# Patient Record
Sex: Female | Born: 1985 | Race: Black or African American | Hispanic: No | Marital: Married | Smoking: Never smoker
Health system: Southern US, Community
[De-identification: ages and names within clinical notes are randomized; demographics above are authoritative.]

## PROBLEM LIST (undated history)

## (undated) ENCOUNTER — Inpatient Hospital Stay (HOSPITAL_COMMUNITY): Payer: Self-pay

## (undated) DIAGNOSIS — Z8679 Personal history of other diseases of the circulatory system: Secondary | ICD-10-CM

## (undated) DIAGNOSIS — R011 Cardiac murmur, unspecified: Secondary | ICD-10-CM

## (undated) DIAGNOSIS — N971 Female infertility of tubal origin: Secondary | ICD-10-CM

## (undated) DIAGNOSIS — G43909 Migraine, unspecified, not intractable, without status migrainosus: Secondary | ICD-10-CM

## (undated) DIAGNOSIS — N736 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 (postinfective): Secondary | ICD-10-CM

## (undated) DIAGNOSIS — N7011 Chronic salpingitis: Secondary | ICD-10-CM

## (undated) DIAGNOSIS — D649 Anemia, unspecified: Secondary | ICD-10-CM

## (undated) DIAGNOSIS — M419 Scoliosis, unspecified: Secondary | ICD-10-CM

## (undated) DIAGNOSIS — Z8759 Personal history of other complications of pregnancy, childbirth and the puerperium: Secondary | ICD-10-CM

## (undated) DIAGNOSIS — IMO0001 Reserved for inherently not codable concepts without codable children: Secondary | ICD-10-CM

## (undated) DIAGNOSIS — N856 Intrauterine synechiae: Secondary | ICD-10-CM

## (undated) DIAGNOSIS — Z8619 Personal history of other infectious and parasitic diseases: Secondary | ICD-10-CM

## (undated) HISTORY — DX: Personal history of other infectious and parasitic diseases: Z86.19

## (undated) HISTORY — DX: Scoliosis, unspecified: M41.9

---
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
## 2006-02-05 ENCOUNTER — Emergency Department (HOSPITAL_COMMUNITY): Admission: EM | Admit: 2006-02-05 | Discharge: 2006-02-05 | Payer: Self-pay | Admitting: Emergency Medicine

## 2006-03-22 ENCOUNTER — Ambulatory Visit: Payer: Self-pay | Admitting: Cardiovascular Disease

## 2006-06-11 ENCOUNTER — Emergency Department (HOSPITAL_COMMUNITY): Admission: EM | Admit: 2006-06-11 | Discharge: 2006-06-12 | Payer: Self-pay | Admitting: *Deleted

## 2006-11-30 ENCOUNTER — Inpatient Hospital Stay (HOSPITAL_COMMUNITY): Admission: AD | Admit: 2006-11-30 | Discharge: 2006-11-30 | Payer: Self-pay | Admitting: Obstetrics and Gynecology

## 2007-02-15 ENCOUNTER — Inpatient Hospital Stay (HOSPITAL_COMMUNITY): Admission: AD | Admit: 2007-02-15 | Discharge: 2007-02-17 | Payer: Self-pay | Admitting: Obstetrics and Gynecology

## 2007-02-19 ENCOUNTER — Inpatient Hospital Stay (HOSPITAL_COMMUNITY): Admission: AD | Admit: 2007-02-19 | Discharge: 2007-02-19 | Payer: Self-pay | Admitting: Obstetrics

## 2007-04-29 ENCOUNTER — Emergency Department (HOSPITAL_COMMUNITY): Admission: EM | Admit: 2007-04-29 | Discharge: 2007-04-29 | Payer: Self-pay | Admitting: Emergency Medicine

## 2007-11-20 ENCOUNTER — Inpatient Hospital Stay (HOSPITAL_COMMUNITY): Admission: AD | Admit: 2007-11-20 | Discharge: 2007-11-20 | Payer: Self-pay | Admitting: Obstetrics & Gynecology

## 2008-01-16 ENCOUNTER — Emergency Department (HOSPITAL_COMMUNITY): Admission: EM | Admit: 2008-01-16 | Discharge: 2008-01-16 | Payer: Self-pay | Admitting: Family Medicine

## 2008-03-31 ENCOUNTER — Emergency Department (HOSPITAL_COMMUNITY): Admission: EM | Admit: 2008-03-31 | Discharge: 2008-03-31 | Payer: Self-pay | Admitting: Emergency Medicine

## 2008-04-07 ENCOUNTER — Emergency Department (HOSPITAL_COMMUNITY): Admission: EM | Admit: 2008-04-07 | Discharge: 2008-04-07 | Payer: Self-pay | Admitting: Emergency Medicine

## 2008-12-23 ENCOUNTER — Emergency Department (HOSPITAL_COMMUNITY): Admission: EM | Admit: 2008-12-23 | Discharge: 2008-12-24 | Payer: Self-pay | Admitting: Emergency Medicine

## 2009-10-08 ENCOUNTER — Emergency Department (HOSPITAL_COMMUNITY): Admission: EM | Admit: 2009-10-08 | Discharge: 2009-10-08 | Payer: Self-pay | Admitting: Emergency Medicine

## 2010-02-05 HISTORY — PX: TONSILLECTOMY: SUR1361

## 2010-05-18 LAB — DIFFERENTIAL
Basophils Absolute: 0.1 10*3/uL (ref 0.0–0.1)
Eosinophils Absolute: 0.1 10*3/uL (ref 0.0–0.7)
Monocytes Absolute: 0.4 10*3/uL (ref 0.1–1.0)
Monocytes Relative: 7 % (ref 3–12)
Neutro Abs: 3.9 10*3/uL (ref 1.7–7.7)
Neutrophils Relative %: 68 % (ref 43–77)

## 2010-05-18 LAB — CBC
HCT: 36.3 % (ref 36.0–46.0)
Hemoglobin: 11.8 g/dL — ABNORMAL LOW (ref 12.0–15.0)
Platelets: 323 10*3/uL (ref 150–400)
RDW: 21.7 % — ABNORMAL HIGH (ref 11.5–15.5)

## 2010-05-18 LAB — STREP A DNA PROBE: Group A Strep Probe: NEGATIVE

## 2010-05-18 LAB — BASIC METABOLIC PANEL
CO2: 26 mEq/L (ref 19–32)
Calcium: 9.4 mg/dL (ref 8.4–10.5)
GFR calc non Af Amer: >60 mL/min (ref >60)

## 2010-05-23 LAB — POCT I-STAT, CHEM 8
BUN: 7 mg/dL (ref 6–23)
Glucose, Bld: 90 mg/dL (ref 70–99)
Potassium: 3.9 mEq/L (ref 3.5–5.1)
Sodium: 140 mEq/L (ref 135–145)

## 2010-06-23 NOTE — Assessment & Plan Note (Signed)
[facility]                            CARDIOLOGY OFFICE NOTE   April Rodgers, April Rodgers                      MRN:          [id number]  DATE:03/22/2006                            DOB:          [birth year]    April Rodgers was seen today as a new patient, she is 25 years old.  She has  had chest pain for 6 months.  The pain is not necessarily exertional, it  is atypical, she can have it at rest.  It actually coincides with the  time where she stopped going to school due to financial aid problems.   The patient does not notice a pleuritic component to it.  There has been  no associated shortness of breath, PND, orthopnea.  She is not had any  significant palpitations.  She does recount a history of having a heart  murmur and having an ultrasound a long time ago.  The patient does not  think the pain is getting worse, but it is certainly not getting better.  She has not tried any medications to make it go away.  She was diagnosed  with a heart murmur in May of 2002.  SHE IS ALLERGIC TO PENICILLIN.  She  does not smoke or drink.   She had been going to school at [employer], however her financial aid fell  through.  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  She is here by herself, her family lives in [place]  and she has some grandparents out at [place].  She is thinking going  back to [employer] to look at some paramedical education.   She has not had any significant previous surgeries, there is a question  of iron deficiency and chronic fatigue.  There is no history of lupus or  other connective tissue diseases.  She has a brother and a sister who  are healthy.  She is the oldest.  There is no congenital heart disease  in that family, she is not taking any chronic medications except for  vitamins with iron.   Family History : non-contributory   EXAMINATION:  HEENT:  Normal, sclerae are noninjected.  SKIN:  Warm and dry, there is  no evidence of Lupus or connective tissue  disease.  LUNGS:  Clear.  Carotids are normal, there is no thyromegaly, no lymphadenopathy.  There  is an S1 with a fairly widely split second heart sound.  ABDOMEN:  Benign.  EXTREMITIES: Intact pulses, no edema.  NEURO:  Nonfocal.   Her resting EKG is normal.   IMPRESSION:  The patient presents with 2 issues.  One is atypical chest  pain without many coronary risk factors.  She has a totally normal  echocardiogram and we will try to do an exercise treadmill study on her  today, I suspect this will be normal.   In regard to her murmur I actually do not hear a murmur but a split  second heart sound.  She had the previous history of a murmur and I  think given her chest pain that she should have an echocardiogram.  She  will have to come back for this.  I do not think that she needs SB  prophylaxis.  Further recommendations will be based on the results of  these 2 tests.  I encouraged her to possibly take Motrin for this pain,  again, it does coincide with the change in her lifestyle with the loss  of her financial aid, and I suspect that some of the pains may be stress  related.     Noralyn Pick. Eden Emms, MD, [facility]  Electronically Signed    PCN/MedQ  DD: 03/22/2006  DT: 03/22/2006  Job #: [id number]   cc:   Lorelle Formosa, M.D.

## 2010-06-23 NOTE — Procedures (Signed)
[facility]                              EXERCISE TREADMILL   ETRULIA, ZARR                      MRN:          [id number]  DATE:03/22/2006                            DOB:          1986-02-04    INDICATION:  Atypical chest pain with normal EKG.   The patient was able to exercise for 5 minutes and 30 seconds under the  advance stage Bruce protocol, exercise stopped due to fatigue.  Maximum  heart rate was 171, peak blood pressure was 148/70.  Resting EKG was  normal with stress.  There was no ischemia and no arrhythmia.   IMPRESSION:  Negative exercise stress test with inadequate work load, no  evidence of ischemic heart disease.     Noralyn Pick. Eden Emms, MD, [facility]  Electronically Signed    PCN/MedQ  DD: 03/22/2006  DT: 03/22/2006  Job #: [id number]

## 2010-10-17 ENCOUNTER — Emergency Department (HOSPITAL_COMMUNITY)
Admission: EM | Admit: 2010-10-17 | Discharge: 2010-10-17 | Disposition: A | Discharge status: Home / Self Care | Payer: Self-pay | Attending: Emergency Medicine | Admitting: Emergency Medicine

## 2010-10-20 ENCOUNTER — Emergency Department (HOSPITAL_COMMUNITY)
Admission: EM | Admit: 2010-10-20 | Discharge: 2010-10-20 | Disposition: A | Discharge status: Home / Self Care | Payer: Medicaid Other | Attending: Emergency Medicine | Admitting: Emergency Medicine

## 2010-10-20 DIAGNOSIS — J029 Acute pharyngitis, unspecified: Secondary | ICD-10-CM | POA: Insufficient documentation

## 2010-10-26 LAB — CBC
HCT: 31.5 — ABNORMAL LOW
Hemoglobin: 9.1 — ABNORMAL LOW
MCHC: 33.1
MCHC: 33.4
MCV: 81.1
MCV: 81.5
RBC: 2.88 — ABNORMAL LOW
RBC: 3.4 — ABNORMAL LOW
WBC: 10.3
WBC: 13.7 — ABNORMAL HIGH

## 2010-10-26 LAB — RPR: RPR Ser Ql: NONREACTIVE

## 2010-10-30 LAB — BASIC METABOLIC PANEL
BUN: 6
Glucose, Bld: 76
Potassium: 3.1 — ABNORMAL LOW

## 2010-11-06 LAB — GC/CHLAMYDIA PROBE AMP, GENITAL
Chlamydia, DNA Probe: NEGATIVE
GC Probe Amp, Genital: NEGATIVE

## 2010-11-06 LAB — URINALYSIS, ROUTINE W REFLEX MICROSCOPIC
Ketones, ur: NEGATIVE
Nitrite: NEGATIVE
Specific Gravity, Urine: 1.01

## 2010-11-06 LAB — URINE MICROSCOPIC-ADD ON

## 2010-11-06 LAB — WET PREP, GENITAL
Clue Cells Wet Prep HPF POC: NONE SEEN
Trich, Wet Prep: NONE SEEN

## 2010-11-06 LAB — POCT PREGNANCY, URINE: Preg Test, Ur: NEGATIVE

## 2010-11-15 LAB — URINALYSIS, ROUTINE W REFLEX MICROSCOPIC
Glucose, UA: NEGATIVE
Protein, ur: NEGATIVE
pH: 7

## 2010-11-15 LAB — URINE MICROSCOPIC-ADD ON

## 2010-11-15 LAB — WET PREP, GENITAL

## 2010-12-27 ENCOUNTER — Encounter (HOSPITAL_COMMUNITY): Payer: Self-pay | Admitting: *Deleted

## 2010-12-27 ENCOUNTER — Inpatient Hospital Stay (HOSPITAL_COMMUNITY): Payer: Medicaid Other

## 2010-12-27 ENCOUNTER — Inpatient Hospital Stay (HOSPITAL_COMMUNITY)
Admission: AD | Admit: 2010-12-27 | Discharge: 2010-12-27 | Disposition: A | Discharge status: Home / Self Care | Payer: Medicaid Other | Source: Ambulatory Visit | Attending: Obstetrics & Gynecology | Admitting: Obstetrics & Gynecology

## 2010-12-27 DIAGNOSIS — Z3490 Encounter for supervision of normal pregnancy, unspecified, unspecified trimester: Secondary | ICD-10-CM

## 2010-12-27 DIAGNOSIS — Z1389 Encounter for screening for other disorder: Secondary | ICD-10-CM

## 2010-12-27 DIAGNOSIS — R109 Unspecified abdominal pain: Secondary | ICD-10-CM | POA: Insufficient documentation

## 2010-12-27 DIAGNOSIS — O99891 Other specified diseases and conditions complicating pregnancy: Secondary | ICD-10-CM | POA: Insufficient documentation

## 2010-12-27 HISTORY — DX: Anemia, unspecified: D64.9

## 2010-12-27 HISTORY — DX: Cardiac murmur, unspecified: R01.1

## 2010-12-27 LAB — URINALYSIS, ROUTINE W REFLEX MICROSCOPIC
Bilirubin Urine: NEGATIVE
Hgb urine dipstick: NEGATIVE
Ketones, ur: NEGATIVE mg/dL
Specific Gravity, Urine: 1.025 (ref 1.005–1.030)
Urobilinogen, UA: 0.2 mg/dL (ref 0.0–1.0)
pH: 7 (ref 5.0–8.0)

## 2010-12-27 LAB — URINE MICROSCOPIC-ADD ON

## 2010-12-27 LAB — WET PREP, GENITAL: Yeast Wet Prep HPF POC: NONE SEEN

## 2010-12-27 NOTE — ED Provider Notes (Signed)
History   Pt presents today c/o lower abd pain and cramping that has worsened since Monday. She denies vag dc, bleeding, fever, or any other sx at this time.  No chief complaint on file.  HPI  OB History    Grav Para Term Preterm Abortions TAB SAB Ect Mult Living   2 1 1  0 0 0 0 0 0 1      Past Medical History  Diagnosis Date  . Heart murmur   . Anemia     Past Surgical History  Procedure Date  . Tonsillectomy     History reviewed. No pertinent family history.  History  Substance Use Topics  . Smoking status: Never Smoker   . Smokeless tobacco: Not on file  . Alcohol Use: No    Allergies:  Allergies  Allergen Reactions  . Penicillins Rash    Prescriptions prior to admission  Medication Sig Dispense Refill  . Ibuprofen-Diphenhydramine Cit (MOTRIN PM PO) Take by mouth. Pain in stomach       . Multiple Vitamins-Minerals (MULTIVITAMIN WITH MINERALS) tablet Take 1 tablet by mouth daily.        Marland Kitchen Pheniramine-PE-APAP (THERAFLU FLU & SORE THROAT PO) Take by mouth. Head cold & sore throat         Review of Systems  Constitutional: Negative for fever.  Cardiovascular: Negative for chest pain.  Gastrointestinal: Positive for abdominal pain. Negative for nausea, vomiting, diarrhea and constipation.  Genitourinary: Negative for dysuria, urgency, frequency and hematuria.  Neurological: Negative for dizziness and headaches.  Psychiatric/Behavioral: Negative for depression and suicidal ideas.   Physical Exam   Blood pressure 119/72, pulse 85, temperature 98.2 F (36.8 C), temperature source Oral, resp. rate 20, height 5\' 10"  (1.778 m), weight 129 lb (58.514 kg), last menstrual period 11/04/2010.  Physical Exam  Nursing note and vitals reviewed. Constitutional: She is oriented to person, place, and time. She appears well-developed and well-nourished. No distress.  HENT:  Head: Normocephalic.  Eyes: EOM are normal. Pupils are equal, round, and reactive to light.  GI:  Soft. She exhibits no distension. There is tenderness. There is no rebound and no guarding.  Genitourinary: No bleeding around the vagina. Vaginal discharge found.       Cervix Lg/closed. Tender over uterus. No adnexal masses.  Neurological: She is alert and oriented to person, place, and time.  Skin: Skin is warm and dry. She is not diaphoretic.  Psychiatric: She has a normal mood and affect. Her behavior is normal. Judgment and thought content normal.    MAU Course  Procedures  Wet prep and GC/Chlamydia cultures done.  Assessment and Plan  Care of this pt was transferred to April Rodgers, CNM at 8:15pm.  April Rodgers. Rice III, DrHSc, MPAS, PA-C  12/27/2010, 6:45 PM   April Hoover, PA 12/27/10 2015  US Ob Comp Less 14 Wks  12/27/2010  *RADIOLOGY REPORT*  Clinical Data: Pregnant female with cramping.  OBSTETRIC <14 WK [place] AND TRANSVAGINAL OB US  Technique:  Both transabdominal and transvaginal ultrasound examinations were performed for complete evaluation of the gestation as well as the maternal uterus, adnexal regions, and pelvic cul-de-sac.  Transvaginal technique was performed to assess early pregnancy.  Comparison:  None.  Intrauterine gestational sac:  Visualized/normal in shape. Yolk sac: Visualized. Embryo: Visualized. Cardiac Activity: Detected. Heart Rate: 170 bpm  CRL: 1.56   mm  8   w  0   d        [place] EDC: 08/08/2011  Maternal uterus/adnexae: Unremarkable.  IMPRESSION: Single living intrauterine pregnancy without evidence of complication.  Original Report Authenticated By: April Rodgers. April Rodgers, M.D.   US Ob Transvaginal  12/27/2010  *RADIOLOGY REPORT*  Clinical Data: Pregnant female with cramping.  OBSTETRIC <14 WK [place] AND TRANSVAGINAL OB US  Technique:  Both transabdominal and transvaginal ultrasound examinations were performed for complete evaluation of the gestation as well as the maternal uterus, adnexal regions, and pelvic cul-de-sac.  Transvaginal technique was performed to  assess early pregnancy.  Comparison:  None.  Intrauterine gestational sac:  Visualized/normal in shape. Yolk sac: Visualized. Embryo: Visualized. Cardiac Activity: Detected. Heart Rate: 170 bpm  CRL: 1.56   mm  8   w  0   d        [place] EDC: 08/08/2011  Maternal uterus/adnexae: Unremarkable.  IMPRESSION: Single living intrauterine pregnancy without evidence of complication.  Original Report Authenticated By: April Rodgers. April Rodgers, M.D.   A/P: 25 y.o. G2P1001 at [redacted]w[redacted]d Normal IUP on u/s Start prenatal care ASAP

## 2010-12-27 NOTE — Progress Notes (Signed)
Took home preg test Fri and Sun, came out positive. Don't know how far she is.  Cramping started on Mon.

## 2010-12-28 ENCOUNTER — Encounter (HOSPITAL_COMMUNITY): Payer: Self-pay | Admitting: Advanced Practice Midwife

## 2012-04-29 ENCOUNTER — Encounter (HOSPITAL_COMMUNITY): Payer: Self-pay | Admitting: Emergency Medicine

## 2012-04-29 ENCOUNTER — Emergency Department (HOSPITAL_COMMUNITY)
Admission: EM | Admit: 2012-04-29 | Discharge: 2012-04-29 | Disposition: A | Discharge status: Home / Self Care | Payer: Self-pay | Attending: Emergency Medicine | Admitting: Emergency Medicine

## 2012-04-29 DIAGNOSIS — R21 Rash and other nonspecific skin eruption: Secondary | ICD-10-CM | POA: Insufficient documentation

## 2012-04-29 DIAGNOSIS — Z79899 Other long term (current) drug therapy: Secondary | ICD-10-CM | POA: Insufficient documentation

## 2012-04-29 DIAGNOSIS — R011 Cardiac murmur, unspecified: Secondary | ICD-10-CM | POA: Insufficient documentation

## 2012-04-29 DIAGNOSIS — Z862 Personal history of diseases of the blood and blood-forming organs and certain disorders involving the immune mechanism: Secondary | ICD-10-CM | POA: Insufficient documentation

## 2012-04-29 MED ORDER — HYDROXYZINE HCL 25 MG PO TABS: 25.0000 mg | ORAL_TABLET | Freq: Four times a day (QID) | ORAL | Status: DC

## 2012-04-29 MED ORDER — PERMETHRIN 5 % EX CREA: TOPICAL_CREAM | CUTANEOUS | Status: DC

## 2012-04-29 MED ORDER — CETIRIZINE HCL 10 MG PO CAPS: 10.0000 mg | ORAL_CAPSULE | Freq: Every day | ORAL | Status: DC

## 2012-04-29 NOTE — ED Provider Notes (Signed)
Medical screening examination/treatment/procedure(s) were performed by non-physician practitioner and as supervising physician I was immediately available for consultation/collaboration.  Athony Coppa, MD 04/29/12 1513 

## 2012-04-29 NOTE — ED Provider Notes (Signed)
History     CSN: [id number]  Arrival date & time 04/29/12  7829   First MD Initiated Contact with Patient 04/29/12 0940      Chief Complaint  Patient presents with  . Rash    (Consider location/radiation/quality/duration/timing/severity/associated sxs/prior treatment) HPI Comments: Patient presents with itchy rash for the past 4 days. Patient states that she recently moved into a new house. She has a daughter at home with not had symptoms. Rash is on the patient's torso, arms, and thighs bilaterally. She denies any airway involvement or mouth swelling. No fever. She's taken Benadryl with some relief. Her daughter does not have any current rash. Patient states that she is not currently pregnant but not currently breast-feeding. (Her chart states that she is pregnant) No new skin exposures or new foods.  Patient is a 27 y.o. female presenting with rash. The history is provided by the patient.  Rash Associated symptoms: no fever, no myalgias, no nausea, no shortness of breath, not vomiting and not wheezing     Past Medical History  Diagnosis Date  . Heart murmur   . Anemia     Past Surgical History  Procedure Laterality Date  . Tonsillectomy      No family history on file.  History  Substance Use Topics  . Smoking status: Never Smoker   . Smokeless tobacco: Not on file  . Alcohol Use: No    OB History   Grav Para Term Preterm Abortions TAB SAB Ect Mult Living   2 1 1  0 0 0 0 0 0 1      Review of Systems  Constitutional: Negative for fever.  HENT: Negative for facial swelling and trouble swallowing.   Eyes: Negative for redness.  Respiratory: Negative for shortness of breath, wheezing and stridor.   Cardiovascular: Negative for chest pain.  Gastrointestinal: Negative for nausea and vomiting.  Musculoskeletal: Negative for myalgias.  Skin: Positive for rash.  Neurological: Negative for light-headedness.  Psychiatric/Behavioral: Negative for confusion.     Allergies  Penicillins  Home Medications   Current Outpatient Rx  Name  Route  Sig  Dispense  Refill  . Cetirizine HCl 10 MG CAPS   Oral   Take 1 capsule (10 mg total) by mouth daily.   7 capsule   0   . hydrOXYzine (ATARAX/VISTARIL) 25 MG tablet   Oral   Take 1 tablet (25 mg total) by mouth every 6 (six) hours.   12 tablet   0   . Multiple Vitamins-Minerals (MULTIVITAMIN WITH MINERALS) tablet   Oral   Take 1 tablet by mouth daily.           . permethrin (ELIMITE) 5 % cream      Apply to affected area once before bed, keep on overnight, and rinse in morning. Repeat in 1 week if rash not gone.   60 g   1   . Pheniramine-PE-APAP (THERAFLU FLU & SORE THROAT PO)   Oral   Take by mouth. Head cold & sore throat            BP 122/78  Pulse 77  Temp(Src) 98.6 F (37 C) (Oral)  SpO2 100%  Physical Exam  Nursing note and vitals reviewed. Constitutional: She appears well-developed and well-nourished.  HENT:  Head: Normocephalic and atraumatic.  No angioedema or tongue swelling.  Eyes: Conjunctivae are normal.  No conjunctival involvement.  Neck: Normal range of motion. Neck supple.  Pulmonary/Chest: No stridor. No respiratory distress. She has  no wheezes.  Abdominal: There is no tenderness.  Neurological: She is alert.  Skin: Skin is warm and dry.  Small punctate papules noted scattered over bilateral thighs, lower back, bilateral upper arms, abdomen diffusely.   Psychiatric: She has a normal mood and affect.    ED Course  Procedures (including critical care time)  Labs Reviewed - No data to display No results found.   1. Rash    9:54 AM Patient seen and examined. Patient counseled. Urged to return with any difficulty breathing or worsening of current symptoms.  Vital signs reviewed and are as follows: Filed Vitals:   04/29/12 0939  BP: 122/78  Pulse: 77  Temp: 98.6 F (37 C)       MDM  Rash, given new environment and physical exam  suspect this is probably scabies. No airway involvement or signs of anaphylaxis. Do not suspect SJS/TEN.        Renne Crigler, PA-C 04/29/12 [phone]

## 2012-04-29 NOTE — ED Notes (Signed)
Pt states 1wk ago she moved into a new apartment and began having a rash to bil arms, legs, and abd area. States that her daughter does not have this but does live in the home with her. Pt has not changed and soaps or detergant and states that she has been using drift to see if this would help. Pt has also been taking benadryl with no relief.

## 2012-08-13 ENCOUNTER — Emergency Department (HOSPITAL_COMMUNITY)
Admission: EM | Admit: 2012-08-13 | Discharge: 2012-08-13 | Disposition: A | Discharge status: Home / Self Care | Payer: Self-pay | Attending: Emergency Medicine | Admitting: Emergency Medicine

## 2012-08-13 ENCOUNTER — Encounter (HOSPITAL_COMMUNITY): Payer: Self-pay | Admitting: *Deleted

## 2012-08-13 DIAGNOSIS — R109 Unspecified abdominal pain: Secondary | ICD-10-CM | POA: Insufficient documentation

## 2012-08-13 DIAGNOSIS — R197 Diarrhea, unspecified: Secondary | ICD-10-CM | POA: Insufficient documentation

## 2012-08-13 DIAGNOSIS — R42 Dizziness and giddiness: Secondary | ICD-10-CM | POA: Insufficient documentation

## 2012-08-13 DIAGNOSIS — R5381 Other malaise: Secondary | ICD-10-CM | POA: Insufficient documentation

## 2012-08-13 DIAGNOSIS — R011 Cardiac murmur, unspecified: Secondary | ICD-10-CM | POA: Insufficient documentation

## 2012-08-13 DIAGNOSIS — Z3202 Encounter for pregnancy test, result negative: Secondary | ICD-10-CM | POA: Insufficient documentation

## 2012-08-13 LAB — CBC WITH DIFFERENTIAL/PLATELET
Basophils Relative: 1 % (ref 0–1)
Eosinophils Relative: 0 % (ref 0–5)
HCT: 33.9 % — ABNORMAL LOW (ref 36.0–46.0)
Hemoglobin: 11.4 g/dL — ABNORMAL LOW (ref 12.0–15.0)
Lymphs Abs: 1.4 10*3/uL (ref 0.7–4.0)
MCH: 26 pg (ref 26.0–34.0)
MCV: 77.4 fL — ABNORMAL LOW (ref 78.0–100.0)
Monocytes Relative: 13 % — ABNORMAL HIGH (ref 3–12)
Neutro Abs: 0.9 10*3/uL — ABNORMAL LOW (ref 1.7–7.7)
RBC: 4.38 MIL/uL (ref 3.87–5.11)
WBC: 2.7 10*3/uL — ABNORMAL LOW (ref 4.0–10.5)

## 2012-08-13 LAB — COMPREHENSIVE METABOLIC PANEL
AST: 14 U/L (ref 0–37)
BUN: 6 mg/dL (ref 6–23)
CO2: 26 mEq/L (ref 19–32)
Chloride: 105 mEq/L (ref 96–112)
Creatinine, Ser: 0.8 mg/dL (ref 0.50–1.10)
GFR calc Af Amer: >90 mL/min (ref >90)
GFR calc non Af Amer: >90 mL/min (ref >90)
Glucose, Bld: 73 mg/dL (ref 70–99)
Total Bilirubin: 0.3 mg/dL (ref 0.3–1.2)

## 2012-08-13 LAB — URINALYSIS, ROUTINE W REFLEX MICROSCOPIC
Glucose, UA: NEGATIVE mg/dL
Ketones, ur: NEGATIVE mg/dL
Protein, ur: NEGATIVE mg/dL

## 2012-08-13 LAB — URINE MICROSCOPIC-ADD ON

## 2012-08-13 MED ORDER — SODIUM CHLORIDE 0.9 % IV BOLUS (SEPSIS)
1000.0000 mL | Freq: Once | INTRAVENOUS | Status: AC
  Administered 2012-08-13: 1000 mL via INTRAVENOUS

## 2012-08-13 MED ORDER — DIPHENOXYLATE-ATROPINE 2.5-0.025 MG PO TABS: 1.0000 | ORAL_TABLET | Freq: Four times a day (QID) | ORAL | Status: DC | PRN

## 2012-08-13 MED ORDER — SODIUM CHLORIDE 0.9 % IV BOLUS (SEPSIS): 500.0000 mL | Freq: Once | INTRAVENOUS | Status: DC

## 2012-08-13 MED ORDER — METRONIDAZOLE 500 MG PO TABS: 500.0000 mg | ORAL_TABLET | Freq: Two times a day (BID) | ORAL | Status: DC

## 2012-08-13 MED ORDER — DICYCLOMINE HCL 20 MG PO TABS: 20.0000 mg | ORAL_TABLET | Freq: Two times a day (BID) | ORAL | Status: DC

## 2012-08-13 MED ORDER — CIPROFLOXACIN HCL 500 MG PO TABS: 500.0000 mg | ORAL_TABLET | Freq: Two times a day (BID) | ORAL | Status: DC

## 2012-08-13 NOTE — Progress Notes (Signed)
[facility] has seen patient. Pt stated that she was getting insurance through fiance's job. Provided her with a list of primary care resources.

## 2012-08-13 NOTE — ED Notes (Signed)
Pt c/o lower abd pain and diarrhea x's 2 weeks. Reports she feels like she has lost weight due to diarrhea.

## 2012-08-13 NOTE — ED Provider Notes (Signed)
History    CSN: [id number] Arrival date & time 08/13/12  1044  First MD Initiated Contact with Patient 08/13/12 1206     Chief Complaint  Patient presents with  . Abdominal Pain  . Diarrhea   (Consider location/radiation/quality/duration/timing/severity/associated sxs/prior Treatment) HPI Comments: 27 y/o female with a PMHx of anemia and heart murmur presents to the ED complaining of lower abdominal cramping and diarrhea intermittently over the past 2 weeks. Patient states anytime after she eats she developed non-radiating 7/10 lower abdominal cramping followed by diarrhea. The only time she does not have diarrhea is if she eats bland foods such as rice. No bloody stools. She has had a few "normal" bowel movements over the past two weeks. States last week she became lightheaded, and how she is beginning to feel weak and tired. Feels as if she is losing weight from the diarrhea. She has not tried any alleviating factors. Denies fever, chills, nausea, vomiting, vaginal bleeding or discharge, urinary changes.  Patient is a 27 y.o. female presenting with abdominal pain and diarrhea.  Abdominal Pain Associated symptoms include abdominal pain, fatigue and weakness. Pertinent negatives include no chest pain, chills, fever, nausea or vomiting.  Diarrhea Associated symptoms: abdominal pain   Associated symptoms: no chills, no fever and no vomiting    Past Medical History  Diagnosis Date  . Heart murmur   . Anemia    Past Surgical History  Procedure Laterality Date  . Tonsillectomy     History reviewed. No pertinent family history. History  Substance Use Topics  . Smoking status: Never Smoker   . Smokeless tobacco: Not on file  . Alcohol Use: No   OB History   Grav Para Term Preterm Abortions TAB SAB Ect Mult Living   2 1 1  0 0 0 0 0 0 1     Review of Systems  Constitutional: Positive for fatigue and unexpected weight change. Negative for fever and chills.  Respiratory: Negative  for shortness of breath.   Cardiovascular: Negative for chest pain.  Gastrointestinal: Positive for abdominal pain and diarrhea. Negative for nausea, vomiting and blood in stool.  Genitourinary: Negative for dysuria, vaginal bleeding, vaginal discharge, difficulty urinating and vaginal pain.  Musculoskeletal: Negative for back pain.  Neurological: Positive for weakness and light-headedness.  All other systems reviewed and are negative.    Allergies  Penicillins  Home Medications   Current Outpatient Rx  Name  Route  Sig  Dispense  Refill  . doxylamine, Sleep, (SLEEP AID) 25 MG tablet   Oral   Take 25 mg by mouth at bedtime as needed for sleep.         . ferrous fumarate (HEMOCYTE - 106 MG FE) 325 (106 FE) MG TABS   Oral   Take 1 tablet by mouth daily.          BP 107/68  Pulse 74  Temp(Src) 98.4 F (36.9 C) (Oral)  Resp 16  Ht 5\' 9"  (1.753 m)  Wt 123 lb (55.792 kg)  BMI 18.16 kg/m2  SpO2 100%  LMP 08/02/2012  Breastfeeding? Unknown Physical Exam  Nursing note and vitals reviewed. Constitutional: She is oriented to person, place, and time. She appears well-developed and well-nourished. No distress.  HENT:  Head: Normocephalic and atraumatic.  Mouth/Throat: Uvula is midline, oropharynx is clear and moist and mucous membranes are normal.  Eyes: Conjunctivae are normal. No scleral icterus.  Neck: Normal range of motion. Neck supple.  Cardiovascular: Normal rate, regular rhythm and normal  pulses.   Murmur heard.  Systolic murmur is present with a grade of 3/6  Mid-systolic click.  Pulmonary/Chest: Effort normal and breath sounds normal.  Abdominal: Normal appearance and bowel sounds are normal. She exhibits no distension and no mass. There is no tenderness. There is no rigidity, no rebound, no guarding and no CVA tenderness.  Musculoskeletal: Normal range of motion. She exhibits no edema.  Neurological: She is alert and oriented to person, place, and time.  Skin:  Skin is warm and dry. She is not diaphoretic. No pallor.  Psychiatric: She has a normal mood and affect. Her behavior is normal.    ED Course  Procedures (including critical care time) Labs Reviewed  CBC WITH DIFFERENTIAL - Abnormal; Notable for the following:    WBC 2.7 (*)    Hemoglobin 11.4 (*)    HCT 33.9 (*)    MCV 77.4 (*)    RDW 18.7 (*)    All other components within normal limits  URINALYSIS, ROUTINE W REFLEX MICROSCOPIC - Abnormal; Notable for the following:    Leukocytes, UA TRACE (*)    All other components within normal limits  URINE MICROSCOPIC-ADD ON - Abnormal; Notable for the following:    Squamous Epithelial / LPF MANY (*)    Bacteria, UA FEW (*)    All other components within normal limits  COMPREHENSIVE METABOLIC PANEL  LIPASE, BLOOD  POCT PREGNANCY, URINE   No results found. 1. Abdominal pain   2. Diarrhea     MDM  Patient with abdominal cramping after eating with diarrhea. She is in NAD with normal vital signs. Asymptomatic in the ED. Hx of anemia- hgb 11.4, baseline for patient. WBC 2.7. I discussed this with Dr. Rhunette Croft who states as long as patient asymptomatic at this time, treat for colitis with cipro/flagyl, f/u with GI. Bentyl, lomotil. Could not move bowels in ED, sent home with instructions for stool sample for C.diff. Stable for discharge. Return precautions discussed. Patient states understanding of plan and is agreeable.   Trevor Mace, PA-C 08/13/12 1320  Trevor Mace, PA-C 08/13/12 1331

## 2012-08-14 NOTE — ED Provider Notes (Signed)
Medical screening examination/treatment/procedure(s) were conducted as a shared visit with non-physician practitioner(s) and myself.  I personally evaluated the patient during the encounter.  Diarrhea x 2 weeks, intermittent abd pain. Leukopenia. Cipro, flagyl ordered, treatment as colitis. No indication for CT. Will get GI follow up.   Derwood Kaplan, MD 08/14/12 [phone]

## 2012-08-16 LAB — CLOSTRIDIUM DIFFICILE BY PCR: Toxigenic C. Difficile by PCR: NEGATIVE

## 2012-11-05 ENCOUNTER — Emergency Department (HOSPITAL_COMMUNITY)
Admission: EM | Admit: 2012-11-05 | Discharge: 2012-11-05 | Disposition: A | Discharge status: Home / Self Care | Payer: No Typology Code available for payment source | Attending: Emergency Medicine | Admitting: Emergency Medicine

## 2012-11-05 ENCOUNTER — Encounter (HOSPITAL_COMMUNITY): Payer: Self-pay | Admitting: Emergency Medicine

## 2012-11-05 DIAGNOSIS — Z8679 Personal history of other diseases of the circulatory system: Secondary | ICD-10-CM | POA: Insufficient documentation

## 2012-11-05 DIAGNOSIS — Z862 Personal history of diseases of the blood and blood-forming organs and certain disorders involving the immune mechanism: Secondary | ICD-10-CM | POA: Insufficient documentation

## 2012-11-05 DIAGNOSIS — S335XXA Sprain of ligaments of lumbar spine, initial encounter: Secondary | ICD-10-CM | POA: Insufficient documentation

## 2012-11-05 DIAGNOSIS — S39012A Strain of muscle, fascia and tendon of lower back, initial encounter: Secondary | ICD-10-CM

## 2012-11-05 DIAGNOSIS — Y9389 Activity, other specified: Secondary | ICD-10-CM | POA: Insufficient documentation

## 2012-11-05 DIAGNOSIS — Y92410 Unspecified street and highway as the place of occurrence of the external cause: Secondary | ICD-10-CM | POA: Insufficient documentation

## 2012-11-05 MED ORDER — METHOCARBAMOL 500 MG PO TABS: 500.0000 mg | ORAL_TABLET | Freq: Two times a day (BID) | ORAL | Status: DC

## 2012-11-05 MED ORDER — IBUPROFEN 800 MG PO TABS: 800.0000 mg | ORAL_TABLET | Freq: Three times a day (TID) | ORAL | Status: DC
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
## 2012-11-05 NOTE — ED Provider Notes (Signed)
Medical screening examination/treatment/procedure(s) were performed by non-physician practitioner and as supervising physician I was immediately available for consultation/collaboration.     Martez Weiand R Cailee Blanke, MD 11/05/12 2357 
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
## 2013-03-28 ENCOUNTER — Emergency Department (HOSPITAL_COMMUNITY): Payer: Managed Care, Other (non HMO)

## 2013-03-28 ENCOUNTER — Emergency Department (HOSPITAL_COMMUNITY)
Admission: EM | Admit: 2013-03-28 | Discharge: 2013-03-28 | Disposition: A | Discharge status: Home / Self Care | Payer: Managed Care, Other (non HMO) | Attending: Emergency Medicine | Admitting: Emergency Medicine

## 2013-03-28 ENCOUNTER — Encounter (HOSPITAL_COMMUNITY): Payer: Self-pay | Admitting: Emergency Medicine

## 2013-03-28 DIAGNOSIS — R0789 Other chest pain: Secondary | ICD-10-CM | POA: Insufficient documentation

## 2013-03-28 DIAGNOSIS — B9789 Other viral agents as the cause of diseases classified elsewhere: Secondary | ICD-10-CM

## 2013-03-28 DIAGNOSIS — R011 Cardiac murmur, unspecified: Secondary | ICD-10-CM | POA: Insufficient documentation

## 2013-03-28 DIAGNOSIS — J069 Acute upper respiratory infection, unspecified: Secondary | ICD-10-CM | POA: Insufficient documentation

## 2013-03-28 DIAGNOSIS — Z880 Allergy status to penicillin: Secondary | ICD-10-CM | POA: Insufficient documentation

## 2013-03-28 DIAGNOSIS — Z862 Personal history of diseases of the blood and blood-forming organs and certain disorders involving the immune mechanism: Secondary | ICD-10-CM | POA: Insufficient documentation

## 2013-03-28 MED ORDER — PREDNISONE 50 MG PO TABS: 50.0000 mg | ORAL_TABLET | Freq: Every day | ORAL | Status: DC

## 2013-03-28 MED ORDER — PROMETHAZINE-DM 6.25-15 MG/5ML PO SYRP: 5.0000 mL | ORAL_SOLUTION | Freq: Four times a day (QID) | ORAL | Status: DC | PRN

## 2013-03-28 MED ORDER — GUAIFENESIN ER 1200 MG PO TB12: 1.0000 | ORAL_TABLET | Freq: Two times a day (BID) | ORAL | Status: DC

## 2013-03-28 NOTE — ED Notes (Signed)
Per patient, cod symptoms since Tuesday-sore throat, nasal congestion-weak

## 2013-03-28 NOTE — ED Provider Notes (Signed)
CSN: [id number]     Arrival date & time 03/28/13  1045 History   First MD Initiated Contact with Patient 03/28/13 1126     Chief Complaint  Patient presents with  . Sore Throat  . Nasal Congestion     (Consider location/radiation/quality/duration/timing/severity/associated sxs/prior Treatment) HPI 28 year old black female with a PMH of mitral valve prolapse, tonsillectomy, and anemia presents with a sore throat, fatigue, and nasal congestion for 5 days. Patient describes sore throat, headache, nasal congestion, cough, increased sputum production, and pleuritic chest pain when she coughs. Patient states that she has mild chest discomfort when she coughs and it is harder to breath when she lies down at night. She has tried taking Nyquil and Dayquil with some relief from her symptoms. Patient denies diarrhea, fever, chest pain, shortness of breath, dyspnea, hemoptysis, and dysphagia.   Past Medical History  Diagnosis Date  . Heart murmur   . Anemia    Past Surgical History  Procedure Laterality Date  . Tonsillectomy     No family history on file. History  Substance Use Topics  . Smoking status: Never Smoker   . Smokeless tobacco: Not on file  . Alcohol Use: No   OB History   Grav Para Term Preterm Abortions TAB SAB Ect Mult Living   2 1 1  0 0 0 0 0 0 1     Review of Systems All other systems negative except as documented in the HPI. All pertinent positives and negatives as reviewed in the HPI.    Allergies  Penicillins  Home Medications   Current Outpatient Rx  Name  Route  Sig  Dispense  Refill  . DM-Doxylamine-Acetaminophen (NYQUIL COLD & FLU PO)   Oral   Take 2 tablets by mouth at bedtime.          BP 112/77  Pulse 81  Temp(Src) 97.8 F (36.6 C) (Oral)  Resp 16  SpO2 100%  LMP 03/09/2013 Physical Exam  Nursing note and vitals reviewed. Constitutional: She is oriented to person, place, and time. She appears well-developed and well-nourished. No distress.   HENT:  Head: Normocephalic and atraumatic.  Mouth/Throat: Oropharynx is clear and moist. No oropharyngeal exudate.  Eyes: Pupils are equal, round, and reactive to light.  Neck: Normal range of motion. Neck supple. No tracheal deviation present.  Cardiovascular: Normal rate and regular rhythm.  Exam reveals no gallop and no friction rub.   Murmur heard. Pulmonary/Chest: Effort normal and breath sounds normal. No stridor. No respiratory distress. She has no wheezes. She has no rales.  Abdominal: Soft.  Neurological: She is alert and oriented to person, place, and time. She exhibits normal muscle tone. Coordination normal.  Skin: Skin is warm and dry.    ED Course  Procedures (including critical care time)   Patient be treated for viral URI.  Told to return here as needed and advised to rest as much as possible.  Increase her fluid intake.  Follow up with a primary care doctor or an urgent care.  Carlyle DollyChristopher W Santana Gosdin, PA-C 03/29/13 1022

## 2013-03-28 NOTE — Discharge Instructions (Signed)
Return here as needed. Follow up with an urgent care or primary care doctor. Increase your fluid intake. Rest as much as possible.

## 2013-03-29 NOTE — ED Provider Notes (Signed)
Medical screening examination/treatment/procedure(s) were performed by non-physician practitioner and as supervising physician I was immediately available for consultation/collaboration.  EKG Interpretation   None       Devoria AlbeIva Yedidya Duddy, MD, Armando GangFACEP   Ward GivensIva L Lincy Belles, MD 03/29/13 [id number]

## 2013-04-03 ENCOUNTER — Emergency Department (HOSPITAL_COMMUNITY): Payer: Managed Care, Other (non HMO)

## 2013-04-03 ENCOUNTER — Encounter (HOSPITAL_COMMUNITY): Payer: Self-pay | Admitting: Emergency Medicine

## 2013-04-03 ENCOUNTER — Emergency Department (HOSPITAL_COMMUNITY)
Admission: EM | Admit: 2013-04-03 | Discharge: 2013-04-03 | Disposition: A | Discharge status: Home / Self Care | Payer: Managed Care, Other (non HMO) | Attending: Emergency Medicine | Admitting: Emergency Medicine

## 2013-04-03 DIAGNOSIS — Y92410 Unspecified street and highway as the place of occurrence of the external cause: Secondary | ICD-10-CM | POA: Insufficient documentation

## 2013-04-03 DIAGNOSIS — Z880 Allergy status to penicillin: Secondary | ICD-10-CM | POA: Insufficient documentation

## 2013-04-03 DIAGNOSIS — S6990XA Unspecified injury of unspecified wrist, hand and finger(s), initial encounter: Secondary | ICD-10-CM | POA: Insufficient documentation

## 2013-04-03 DIAGNOSIS — Y9389 Activity, other specified: Secondary | ICD-10-CM | POA: Insufficient documentation

## 2013-04-03 DIAGNOSIS — Z79899 Other long term (current) drug therapy: Secondary | ICD-10-CM | POA: Insufficient documentation

## 2013-04-03 DIAGNOSIS — Z862 Personal history of diseases of the blood and blood-forming organs and certain disorders involving the immune mechanism: Secondary | ICD-10-CM | POA: Insufficient documentation

## 2013-04-03 DIAGNOSIS — M549 Dorsalgia, unspecified: Secondary | ICD-10-CM

## 2013-04-03 DIAGNOSIS — Z3202 Encounter for pregnancy test, result negative: Secondary | ICD-10-CM | POA: Insufficient documentation

## 2013-04-03 DIAGNOSIS — R011 Cardiac murmur, unspecified: Secondary | ICD-10-CM | POA: Insufficient documentation

## 2013-04-03 DIAGNOSIS — IMO0002 Reserved for concepts with insufficient information to code with codable children: Secondary | ICD-10-CM | POA: Insufficient documentation

## 2013-04-03 LAB — URINALYSIS, ROUTINE W REFLEX MICROSCOPIC
Bilirubin Urine: NEGATIVE
GLUCOSE, UA: NEGATIVE mg/dL
HGB URINE DIPSTICK: NEGATIVE
KETONES UR: NEGATIVE mg/dL
Nitrite: NEGATIVE
PH: 6.5 (ref 5.0–8.0)
PROTEIN: NEGATIVE mg/dL
Specific Gravity, Urine: 1.035 — ABNORMAL HIGH (ref 1.005–1.030)
Urobilinogen, UA: 0.2 mg/dL (ref 0.0–1.0)

## 2013-04-03 LAB — URINE MICROSCOPIC-ADD ON

## 2013-04-03 LAB — PREGNANCY, URINE: Preg Test, Ur: NEGATIVE

## 2013-04-03 MED ORDER — ACETAMINOPHEN 325 MG PO TABS
650.0000 mg | ORAL_TABLET | Freq: Once | ORAL | Status: AC
  Administered 2013-04-03: 650 mg via ORAL
  Filled 2013-04-03: qty 2
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
## 2013-04-03 NOTE — ED Provider Notes (Signed)
Medical screening examination/treatment/procedure(s) were performed by non-physician practitioner and as supervising physician I was immediately available for consultation/collaboration.  EKG Interpretation  None    Antanette Richwine N Mylinda Brook, DO 04/03/13 1505 

## 2013-04-04 LAB — URINE CULTURE: Colony Count: 75000

## 2013-05-09 ENCOUNTER — Ambulatory Visit: Payer: BC Managed Care – PPO

## 2013-12-07 ENCOUNTER — Encounter (HOSPITAL_COMMUNITY): Payer: Self-pay | Admitting: Emergency Medicine

## 2013-12-16 ENCOUNTER — Encounter (HOSPITAL_COMMUNITY): Payer: Self-pay | Admitting: Emergency Medicine

## 2013-12-16 ENCOUNTER — Emergency Department (HOSPITAL_COMMUNITY)
Admission: EM | Admit: 2013-12-16 | Discharge: 2013-12-16 | Disposition: A | Discharge status: Home / Self Care | Payer: BC Managed Care – PPO | Attending: Emergency Medicine | Admitting: Emergency Medicine

## 2013-12-16 DIAGNOSIS — Z862 Personal history of diseases of the blood and blood-forming organs and certain disorders involving the immune mechanism: Secondary | ICD-10-CM | POA: Diagnosis not present

## 2013-12-16 DIAGNOSIS — Z880 Allergy status to penicillin: Secondary | ICD-10-CM | POA: Insufficient documentation

## 2013-12-16 DIAGNOSIS — R011 Cardiac murmur, unspecified: Secondary | ICD-10-CM | POA: Insufficient documentation

## 2013-12-16 DIAGNOSIS — Z7952 Long term (current) use of systemic steroids: Secondary | ICD-10-CM | POA: Insufficient documentation

## 2013-12-16 DIAGNOSIS — N390 Urinary tract infection, site not specified: Secondary | ICD-10-CM | POA: Insufficient documentation

## 2013-12-16 DIAGNOSIS — Z79899 Other long term (current) drug therapy: Secondary | ICD-10-CM | POA: Insufficient documentation

## 2013-12-16 DIAGNOSIS — R300 Dysuria: Secondary | ICD-10-CM | POA: Diagnosis present

## 2013-12-16 DIAGNOSIS — Z3202 Encounter for pregnancy test, result negative: Secondary | ICD-10-CM | POA: Insufficient documentation

## 2013-12-16 LAB — URINALYSIS, ROUTINE W REFLEX MICROSCOPIC
Bilirubin Urine: NEGATIVE
GLUCOSE, UA: NEGATIVE mg/dL
Ketones, ur: NEGATIVE mg/dL
NITRITE: NEGATIVE
PH: 6 (ref 5.0–8.0)
Protein, ur: NEGATIVE mg/dL
SPECIFIC GRAVITY, URINE: 1.019 (ref 1.005–1.030)
Urobilinogen, UA: 0.2 mg/dL (ref 0.0–1.0)

## 2013-12-16 LAB — POC URINE PREG, ED: PREG TEST UR: NEGATIVE

## 2013-12-16 LAB — URINE MICROSCOPIC-ADD ON

## 2013-12-16 MED ORDER — SULFAMETHOXAZOLE-TRIMETHOPRIM 800-160 MG PO TABS
1.0000 | ORAL_TABLET | Freq: Once | ORAL | Status: AC
  Administered 2013-12-16: 1 via ORAL
  Filled 2013-12-16: qty 1

## 2013-12-16 MED ORDER — PHENAZOPYRIDINE HCL 200 MG PO TABS
200.0000 mg | ORAL_TABLET | Freq: Three times a day (TID) | ORAL | Status: DC
  Filled 2013-12-16: qty 1

## 2013-12-16 MED ORDER — SULFAMETHOXAZOLE-TRIMETHOPRIM 800-160 MG PO TABS
1.0000 | ORAL_TABLET | Freq: Two times a day (BID) | ORAL | Status: DC
Start: 2013-12-16 — End: 2014-12-03

## 2013-12-16 MED ORDER — PHENAZOPYRIDINE HCL 200 MG PO TABS
200.0000 mg | ORAL_TABLET | Freq: Three times a day (TID) | ORAL | Status: DC
  Administered 2013-12-16: 200 mg via ORAL

## 2013-12-16 MED ORDER — PHENAZOPYRIDINE HCL 200 MG PO TABS: 200.0000 mg | ORAL_TABLET | Freq: Three times a day (TID) | ORAL | Status: DC

## 2013-12-16 NOTE — ED Notes (Signed)
Pt states she has been having urinary frequency, nausea, lower abd pain, and dysuria for the past 3 to 5 days

## 2013-12-16 NOTE — Discharge Instructions (Signed)

## 2013-12-16 NOTE — ED Provider Notes (Signed)
CSN: [id number]     Arrival date & time 12/16/13  1957 History   First MD Initiated Contact with Patient 12/16/13 2123    This chart was scribed for non-physician practitioner, Elpidio AnisShari Meyah Corle, working with Rolland PorterMark James, MD by Marica OtterNusrat Rahman, ED Scribe. This patient was seen in room WA22/WA22 and the patient's care was started at 9:32 PM.  Chief Complaint  Patient presents with  . Urinary Frequency  . Dysuria   Patient is a 28 y.o. female presenting with dysuria. The history is provided by the patient. No language interpreter was used.  Dysuria Associated symptoms: no fever, no flank pain, no nausea, no vaginal discharge and no vomiting     PCP: No PCP Per Patient HPI Comments: April Rodgers is a 28 y.o. female, with medical Hx noted below, who presents to the Emergency Department complaining of urinary frequency and associated dysuria onset 3-5 days ago. Pt denies vaginal discharge or vaginal bleeding. Pt reports an allergy to penicillin, stating she develops hives when she takes it--though pt notes she has not used penicillin since childhood.   Past Medical History  Diagnosis Date  . Heart murmur   . Anemia    Past Surgical History  Procedure Laterality Date  . Tonsillectomy     History reviewed. No pertinent family history. History  Substance Use Topics  . Smoking status: Never Smoker   . Smokeless tobacco: Not on file  . Alcohol Use: No   OB History    Gravida Para Term Preterm AB TAB SAB Ectopic Multiple Living   2 1 1  0 0 0 0 0 0 1     Review of Systems  Constitutional: Negative for fever and chills.  Gastrointestinal: Negative for nausea and vomiting.  Genitourinary: Positive for dysuria. Negative for flank pain, vaginal bleeding and vaginal discharge.  Psychiatric/Behavioral: Negative for confusion.      Allergies  Penicillins  Home Medications   Prior to Admission medications   Medication Sig Start Date End Date Taking? Authorizing Provider  acetaminophen  (TYLENOL) 500 MG tablet Take 1,000 mg by mouth every 6 (six) hours as needed for moderate pain (toothache).   Yes Historical Provider, MD  ibuprofen (ADVIL,MOTRIN) 200 MG tablet Take 400 mg by mouth every 6 (six) hours as needed for moderate pain (migraine).    Yes Historical Provider, MD  DM-Doxylamine-Acetaminophen (NYQUIL COLD & FLU PO) Take 2 tablets by mouth at bedtime.    Historical Provider, MD  Guaifenesin 1200 MG TB12 Take 1 tablet (1,200 mg total) by mouth 2 (two) times daily. 03/28/13   Jamesetta Orleanshristopher W Lawyer, PA-C  predniSONE (DELTASONE) 50 MG tablet Take 1 tablet (50 mg total) by mouth daily. 03/28/13   Jamesetta Orleanshristopher W Lawyer, PA-C  promethazine-dextromethorphan (PROMETHAZINE-DM) 6.25-15 MG/5ML syrup Take 5 mLs by mouth 4 (four) times daily as needed for cough. 03/28/13   Carlyle Dollyhristopher W Lawyer, PA-C   Triage Vitals: BP 110/68 mmHg  Pulse 67  Temp(Src) 98.4 F (36.9 C) (Oral)  Resp 14  SpO2 100%  LMP 12/05/2013 (Exact Date) Physical Exam  Constitutional: She is oriented to person, place, and time. She appears well-developed and well-nourished. No distress.  extremely well appearing   HENT:  Head: Normocephalic and atraumatic.  Eyes: Conjunctivae and EOM are normal.  Abdominal: There is no tenderness.  Musculoskeletal: Normal range of motion.  Neurological: She is alert and oriented to person, place, and time.  Skin: Skin is warm and dry.  Psychiatric: She has a normal mood and  affect. Her behavior is normal.  Nursing note and vitals reviewed.   ED Course  Procedures (including critical care time) DIAGNOSTIC STUDIES: Oxygen Saturation is 100% on RA, nl by my interpretation.    COORDINATION OF CARE: 9:35 PM-Discussed treatment plan which includes discussing UA results, meds (including antibiotics) and staying hydrated with pt at bedside and pt agreed to plan.   Labs Review Labs Reviewed  URINALYSIS, ROUTINE W REFLEX MICROSCOPIC - Abnormal; Notable for the following:     APPearance CLOUDY (*)    Hgb urine dipstick TRACE (*)    Leukocytes, UA SMALL (*)    All other components within normal limits  URINE MICROSCOPIC-ADD ON - Abnormal; Notable for the following:    Squamous Epithelial / LPF MANY (*)    Bacteria, UA FEW (*)    All other components within normal limits  POC URINE PREG, ED    Imaging Review No results found.   EKG Interpretation None      MDM   Final diagnoses:  None    1. Uncomplicated UTI  Well appearing patient with symptoms and lab evidence of UTI. No vaginal discharge, fever or flank pain.   I personally performed the services described in this documentation, which was scribed in my presence. The recorded information has been reviewed and is accurate.     Arnoldo HookerShari A Eliette Drumwright, PA-C 12/16/13 2147  Rolland PorterMark James, MD 12/26/13 [id number]

## 2014-02-05 HISTORY — PX: WISDOM TOOTH EXTRACTION: SHX21

## 2014-02-21 ENCOUNTER — Emergency Department (HOSPITAL_COMMUNITY)
Admission: EM | Admit: 2014-02-21 | Discharge: 2014-02-21 | Disposition: A | Discharge status: Home / Self Care | Payer: Managed Care, Other (non HMO) | Attending: Emergency Medicine | Admitting: Emergency Medicine

## 2014-02-21 ENCOUNTER — Encounter (HOSPITAL_COMMUNITY): Payer: Self-pay | Admitting: Emergency Medicine

## 2014-02-21 DIAGNOSIS — L608 Other nail disorders: Secondary | ICD-10-CM

## 2014-02-21 DIAGNOSIS — Z79899 Other long term (current) drug therapy: Secondary | ICD-10-CM | POA: Diagnosis not present

## 2014-02-21 DIAGNOSIS — Z862 Personal history of diseases of the blood and blood-forming organs and certain disorders involving the immune mechanism: Secondary | ICD-10-CM | POA: Insufficient documentation

## 2014-02-21 DIAGNOSIS — Z880 Allergy status to penicillin: Secondary | ICD-10-CM | POA: Insufficient documentation

## 2014-02-21 DIAGNOSIS — R011 Cardiac murmur, unspecified: Secondary | ICD-10-CM | POA: Insufficient documentation

## 2014-02-21 NOTE — ED Provider Notes (Signed)
TIME SEEN: 9:40 AM  CHIEF COMPLAINT: Nail discoloration  HPI: Pt is a 29 y.o. right-hand-dominant female who presents emergency department with white female discoloration. Reports that 2 weeks ago she was putting texturizer on her boyfriend's hair and was not using gloves. She states afterwards she had some burning to the third fourth and fifth digit of the right hand. She noticed that her nails have appeared discolored. She states whenever she is using soap or shampoo she will have some burning of these fingernails. No other skin lesions. She was concerned that this may be a fungal infection given the discoloration.  ROS: See HPI Constitutional: no fever  Eyes: no drainage  ENT: no runny nose   Cardiovascular:  no chest pain  Resp: no SOB  GI: no vomiting GU: no dysuria Integumentary: no rash  Allergy: no hives  Musculoskeletal: no leg swelling  Neurological: no slurred speech ROS otherwise negative  PAST MEDICAL HISTORY/PAST SURGICAL HISTORY:  Past Medical History  Diagnosis Date  . Heart murmur   . Anemia     MEDICATIONS:  Prior to Admission medications   Medication Sig Start Date End Date Taking? Authorizing Provider  ibuprofen (ADVIL,MOTRIN) 200 MG tablet Take 200 mg by mouth every 6 (six) hours as needed for moderate pain (migraine).    Yes Historical Provider, MD  Multiple Vitamin (MULTIVITAMIN WITH MINERALS) TABS tablet Take 1 tablet by mouth daily.   Yes Historical Provider, MD  Guaifenesin 1200 MG TB12 Take 1 tablet (1,200 mg total) by mouth 2 (two) times daily. Patient not taking: Reported on 02/21/2014 03/28/13   Jamesetta Orleans Lawyer, PA-C  phenazopyridine (PYRIDIUM) 200 MG tablet Take 1 tablet (200 mg total) by mouth 3 (three) times daily. Patient not taking: Reported on 02/21/2014 12/16/13   Melvenia Beam A Upstill, PA-C  predniSONE (DELTASONE) 50 MG tablet Take 1 tablet (50 mg total) by mouth daily. Patient not taking: Reported on 02/21/2014 03/28/13   Jamesetta Orleans Lawyer, PA-C   promethazine-dextromethorphan (PROMETHAZINE-DM) 6.25-15 MG/5ML syrup Take 5 mLs by mouth 4 (four) times daily as needed for cough. Patient not taking: Reported on 02/21/2014 03/28/13   Jamesetta Orleans Lawyer, PA-C  sulfamethoxazole-trimethoprim (SEPTRA DS) 800-160 MG per tablet Take 1 tablet by mouth 2 (two) times daily. Patient not taking: Reported on 02/21/2014 12/16/13   Arnoldo Hooker, PA-C    ALLERGIES:  Allergies  Allergen Reactions  . Penicillins Hives and Rash    SOCIAL HISTORY:  History  Substance Use Topics  . Smoking status: Never Smoker   . Smokeless tobacco: Not on file  . Alcohol Use: No    FAMILY HISTORY: No family history on file.  EXAM: BP 114/71 mmHg  Pulse 90  Temp(Src) 98.4 F (36.9 C) (Oral)  Resp 18  SpO2 100% CONSTITUTIONAL: Alert and oriented and responds appropriately to questions. Well-appearing; well-nourished HEAD: Normocephalic EYES: Conjunctivae clear, PERRL ENT: normal nose; no rhinorrhea; moist mucous membranes; pharynx without lesions noted NECK: Supple, no meningismus, no LAD  CARD: RRR; S1 and S2 appreciated; no murmurs, no clicks, no rubs, no gallops RESP: Normal chest excursion without splinting or tachypnea; breath sounds clear and equal bilaterally; no wheezes, no rhonchi, no rales,  ABD/GI: Normal bowel sounds; non-distended; soft, non-tender, no rebound, no guarding BACK:  The back appears normal and is non-tender to palpation, there is no CVA tenderness EXT: Normal ROM in all joints; non-tender to palpation; no edema; normal capillary refill; no cyanosis    SKIN: Normal color for age and race;  warm; patient has some darkening of the distal half of the third fourth and fifth fingernails on the right hand, there is no sign of paronychia, no skin lesions, no swelling of the hand, normal capillary refill, 2+ radial pulses bilaterally NEURO: Moves all extremities equally PSYCH: The patient's mood and manner are appropriate. Grooming and  personal hygiene are appropriate.  MEDICAL DECISION MAKING: Patient here with nail discoloration after using texturizer on her boyfriends here 2 weeks ago. She has no sign of skin burn currently. Her nails are attached to the nailbed. There is no sign of superimposed infection in this does not appear to be fungal in nature. Her nails are not thick or flaking. Discussed with patient that this discoloration is likely secondary from chemical reaction from the texturizer used 2 weeks ago. Have advised her in the future to use gloves. Discussed with patient that this discoloration will likely improve as her nails continue to grow given that only 50% of them are affected currently in the ARD appeared to have grown out significantly. Discussed return precautions. She verbalized understanding and is comfortable with plan.      Layla MawKristen N Ward, DO 02/21/14 1012

## 2014-02-21 NOTE — ED Notes (Signed)
Pt states that she put texturizer on her boyfriends hair couple weeks ago without using any gloves and c/o ?nail infection on middle and ring finger on her right hand. Pt states that when she was applying the product to his hair that her fingers started burning and when she was using finger nail polish remover to take off her nail polish it burned as well.

## 2014-02-21 NOTE — Discharge Instructions (Signed)
You were seen in the emergency department for discoloration of your right fingernails likely from chemical reaction from texturizer that you used two weeks ago. There is no active sign of infection bacterial or fungal at this time. This will likely continue to improve as your fingernails grow out. I recommend that in the future when using texturizer or other chemicals that you wear gloves.  If your symptoms continue after your finger nose have grown out completely I recommend you follow-up with your primary care physician for this.

## 2014-06-23 ENCOUNTER — Encounter (HOSPITAL_COMMUNITY): Payer: Self-pay

## 2014-06-23 ENCOUNTER — Emergency Department (HOSPITAL_COMMUNITY)
Admission: EM | Admit: 2014-06-23 | Discharge: 2014-06-23 | Disposition: A | Discharge status: Home / Self Care | Payer: Managed Care, Other (non HMO) | Attending: Emergency Medicine | Admitting: Emergency Medicine

## 2014-06-23 DIAGNOSIS — Z7952 Long term (current) use of systemic steroids: Secondary | ICD-10-CM | POA: Diagnosis not present

## 2014-06-23 DIAGNOSIS — R011 Cardiac murmur, unspecified: Secondary | ICD-10-CM | POA: Diagnosis not present

## 2014-06-23 DIAGNOSIS — Z79899 Other long term (current) drug therapy: Secondary | ICD-10-CM | POA: Diagnosis not present

## 2014-06-23 DIAGNOSIS — Z862 Personal history of diseases of the blood and blood-forming organs and certain disorders involving the immune mechanism: Secondary | ICD-10-CM | POA: Insufficient documentation

## 2014-06-23 DIAGNOSIS — Z792 Long term (current) use of antibiotics: Secondary | ICD-10-CM | POA: Insufficient documentation

## 2014-06-23 DIAGNOSIS — N898 Other specified noninflammatory disorders of vagina: Secondary | ICD-10-CM | POA: Diagnosis not present

## 2014-06-23 DIAGNOSIS — N3090 Cystitis, unspecified without hematuria: Secondary | ICD-10-CM | POA: Diagnosis not present

## 2014-06-23 DIAGNOSIS — Z3202 Encounter for pregnancy test, result negative: Secondary | ICD-10-CM | POA: Insufficient documentation

## 2014-06-23 DIAGNOSIS — R300 Dysuria: Secondary | ICD-10-CM | POA: Diagnosis present

## 2014-06-23 LAB — URINE MICROSCOPIC-ADD ON

## 2014-06-23 LAB — WET PREP, GENITAL
Clue Cells Wet Prep HPF POC: NONE SEEN
Trich, Wet Prep: NONE SEEN
YEAST WET PREP: NONE SEEN

## 2014-06-23 LAB — URINALYSIS, ROUTINE W REFLEX MICROSCOPIC
BILIRUBIN URINE: NEGATIVE
Glucose, UA: NEGATIVE mg/dL
Ketones, ur: NEGATIVE mg/dL
Nitrite: NEGATIVE
PH: 6.5 (ref 5.0–8.0)
Protein, ur: NEGATIVE mg/dL
SPECIFIC GRAVITY, URINE: 1.006 (ref 1.005–1.030)
UROBILINOGEN UA: 0.2 mg/dL (ref 0.0–1.0)

## 2014-06-23 LAB — POC URINE PREG, ED: Preg Test, Ur: NEGATIVE

## 2014-06-23 MED ORDER — AZITHROMYCIN 250 MG PO TABS
1000.0000 mg | ORAL_TABLET | Freq: Once | ORAL | Status: AC
  Administered 2014-06-23: 1000 mg via ORAL
  Filled 2014-06-23: qty 4

## 2014-06-23 MED ORDER — CEPHALEXIN 500 MG PO CAPS: 500.0000 mg | ORAL_CAPSULE | Freq: Four times a day (QID) | ORAL | Status: DC

## 2014-06-23 MED ORDER — CEFTRIAXONE SODIUM 1 G IJ SOLR
1.0000 g | Freq: Once | INTRAMUSCULAR | Status: AC
  Administered 2014-06-23: 1 g via INTRAMUSCULAR
  Filled 2014-06-23: qty 10

## 2014-06-23 MED ORDER — LIDOCAINE HCL 1 % IJ SOLN
INTRAMUSCULAR | Status: AC
  Administered 2014-06-23: 2.1 mL
  Filled 2014-06-23: qty 20

## 2014-06-23 NOTE — ED Notes (Signed)
Pt presents with c/o burning with urination and nausea that started on Monday. Pt denies any vomiting, nausea only. Pt denies any hematuria.

## 2014-06-23 NOTE — Discharge Instructions (Signed)

## 2014-06-23 NOTE — ED Provider Notes (Signed)
CSN: [id number]     Arrival date & time 06/23/14  [id number] History   First MD Initiated Contact with Patient 06/23/14 [phone]     Chief Complaint  Patient presents with  . Burning with urination    . Nausea     (Consider location/radiation/quality/duration/timing/severity/associated sxs/prior Treatment) Patient is a 29 y.o. female presenting with dysuria.  Dysuria Pain quality:  Burning Pain severity:  Moderate Onset quality:  Gradual Duration:  2 days Timing:  Constant Progression:  Worsening Chronicity:  Recurrent Recent urinary tract infections: no   Relieved by:  Nothing Ineffective treatments:  None tried Urinary symptoms: frequent urination   Urinary symptoms: no discolored urine, no foul-smelling urine, no hesitancy and no bladder incontinence   Associated symptoms: flank pain (R)   Associated symptoms: no abdominal pain and no fever   Risk factors: sexually active   Risk factors: no hx of pyelonephritis     Past Medical History  Diagnosis Date  . Heart murmur   . Anemia    Past Surgical History  Procedure Laterality Date  . Tonsillectomy     No family history on file. History  Substance Use Topics  . Smoking status: Never Smoker   . Smokeless tobacco: Not on file  . Alcohol Use: No   OB History    Gravida Para Term Preterm AB TAB SAB Ectopic Multiple Living   2 1 1  0 0 0 0 0 0 1     Review of Systems  Constitutional: Negative for fever.  Gastrointestinal: Negative for abdominal pain.  Genitourinary: Positive for dysuria and flank pain (R).  All other systems reviewed and are negative.     Allergies  Penicillins  Home Medications   Prior to Admission medications   Medication Sig Start Date End Date Taking? Authorizing Provider  clindamycin (CLEOCIN) 150 MG capsule Take 150 mg by mouth 4 (four) times daily. 06/08/14  Yes Historical Provider, MD  ibuprofen (ADVIL,MOTRIN) 200 MG tablet Take 200 mg by mouth every 6 (six) hours as needed for moderate pain  (migraine).    Yes Historical Provider, MD  oxyCODONE-acetaminophen (PERCOCET/ROXICET) 5-325 MG per tablet Take 1 tablet by mouth every 6 (six) hours as needed for moderate pain or severe pain.  06/08/14  Yes Historical Provider, MD  cephALEXin (KEFLEX) 500 MG capsule Take 1 capsule (500 mg total) by mouth 4 (four) times daily. 06/23/14   Mirian MoMatthew Irasema Chalk, MD  Guaifenesin 1200 MG TB12 Take 1 tablet (1,200 mg total) by mouth 2 (two) times daily. Patient not taking: Reported on 02/21/2014 03/28/13   Charlestine Nighthristopher Lawyer, PA-C  phenazopyridine (PYRIDIUM) 200 MG tablet Take 1 tablet (200 mg total) by mouth 3 (three) times daily. Patient not taking: Reported on 02/21/2014 12/16/13   Elpidio AnisShari Upstill, PA-C  predniSONE (DELTASONE) 50 MG tablet Take 1 tablet (50 mg total) by mouth daily. Patient not taking: Reported on 02/21/2014 03/28/13   Charlestine Nighthristopher Lawyer, PA-C  promethazine-dextromethorphan (PROMETHAZINE-DM) 6.25-15 MG/5ML syrup Take 5 mLs by mouth 4 (four) times daily as needed for cough. Patient not taking: Reported on 02/21/2014 03/28/13   Charlestine Nighthristopher Lawyer, PA-C  sulfamethoxazole-trimethoprim (SEPTRA DS) 800-160 MG per tablet Take 1 tablet by mouth 2 (two) times daily. Patient not taking: Reported on 02/21/2014 12/16/13   Elpidio AnisShari Upstill, PA-C   BP 109/72 mmHg  Pulse 79  Temp(Src) 97.8 F (36.6 C) (Oral)  Resp 18  SpO2 100%  LMP 06/16/2014 (Approximate) Physical Exam  Constitutional: She is oriented to person, place, and time. She appears  well-developed and well-nourished.  HENT:  Head: Normocephalic and atraumatic.  Right Ear: External ear normal.  Left Ear: External ear normal.  Eyes: Conjunctivae and EOM are normal. Pupils are equal, round, and reactive to light.  Neck: Normal range of motion. Neck supple.  Cardiovascular: Normal rate, regular rhythm, normal heart sounds and intact distal pulses.   Pulmonary/Chest: Effort normal and breath sounds normal.  Abdominal: Soft. Bowel sounds are normal.  There is no tenderness.  Genitourinary: Cervix exhibits discharge (white/yellow). Cervix exhibits no motion tenderness and no friability. Right adnexum displays no mass, no tenderness and no fullness. Left adnexum displays no mass, no tenderness and no fullness.  Musculoskeletal: Normal range of motion.  Neurological: She is alert and oriented to person, place, and time.  Skin: Skin is warm and dry.  Vitals reviewed.   ED Course  Procedures (including critical care time) Labs Review Labs Reviewed  WET PREP, GENITAL - Abnormal; Notable for the following:    WBC, Wet Prep HPF POC FEW (*)    All other components within normal limits  URINALYSIS, ROUTINE W REFLEX MICROSCOPIC - Abnormal; Notable for the following:    APPearance CLOUDY (*)    Hgb urine dipstick MODERATE (*)    Leukocytes, UA MODERATE (*)    All other components within normal limits  URINE CULTURE  URINE MICROSCOPIC-ADD ON  RPR  HIV ANTIBODY (ROUTINE TESTING)  POC URINE PREG, ED  GC/CHLAMYDIA PROBE AMP ([place])    Imaging Review No results found.   EKG Interpretation None      MDM   Final diagnoses:  Cystitis    29 y.o. female with pertinent PMH of prior UTI presents with recurrent dysuria as above.  No fever, pt states she has had some mild nausea.  Exam today as above with vaginal dc, however otherwise benign.  Doubt PID given nature of pain and no abd tenderness.  Wu as above.  Will treat with keflex. Given empiric cervicitis treatment with rocephin and azithro.  DC home in stable condition.    I have reviewed all laboratory and imaging studies if ordered as above  1. Cystitis         Mirian MoMatthew Alaia Lordi, MD 06/23/14 [phone]

## 2014-06-23 NOTE — ED Notes (Signed)
MD at bedside. 

## 2014-06-24 LAB — URINE CULTURE
CULTURE: NO GROWTH
Colony Count: NO GROWTH

## 2014-06-24 LAB — GC/CHLAMYDIA PROBE AMP (~~LOC~~) NOT AT ARMC
CHLAMYDIA, DNA PROBE: NEGATIVE
Neisseria Gonorrhea: NEGATIVE

## 2014-06-24 LAB — HIV ANTIBODY (ROUTINE TESTING W REFLEX): HIV Screen 4th Generation wRfx: NONREACTIVE

## 2014-06-24 LAB — RPR: RPR: NONREACTIVE

## 2014-12-03 ENCOUNTER — Inpatient Hospital Stay (HOSPITAL_COMMUNITY): Payer: Managed Care, Other (non HMO)

## 2014-12-03 ENCOUNTER — Encounter (HOSPITAL_COMMUNITY): Payer: Self-pay | Admitting: *Deleted

## 2014-12-03 ENCOUNTER — Inpatient Hospital Stay (HOSPITAL_COMMUNITY)
Admission: AD | Admit: 2014-12-03 | Discharge: 2014-12-03 | Disposition: A | Discharge status: Home / Self Care | Payer: Managed Care, Other (non HMO) | Source: Ambulatory Visit | Attending: Obstetrics and Gynecology | Admitting: Obstetrics and Gynecology

## 2014-12-03 DIAGNOSIS — R109 Unspecified abdominal pain: Secondary | ICD-10-CM | POA: Diagnosis not present

## 2014-12-03 DIAGNOSIS — Z880 Allergy status to penicillin: Secondary | ICD-10-CM | POA: Diagnosis not present

## 2014-12-03 DIAGNOSIS — Z3A01 Less than 8 weeks gestation of pregnancy: Secondary | ICD-10-CM | POA: Diagnosis not present

## 2014-12-03 DIAGNOSIS — O26899 Other specified pregnancy related conditions, unspecified trimester: Secondary | ICD-10-CM

## 2014-12-03 DIAGNOSIS — O9989 Other specified diseases and conditions complicating pregnancy, childbirth and the puerperium: Secondary | ICD-10-CM

## 2014-12-03 DIAGNOSIS — O26891 Other specified pregnancy related conditions, first trimester: Secondary | ICD-10-CM | POA: Diagnosis not present

## 2014-12-03 LAB — CBC WITH DIFFERENTIAL/PLATELET
BASOS ABS: 0 10*3/uL (ref 0.0–0.1)
Basophils Relative: 1 %
Eosinophils Absolute: 0 10*3/uL (ref 0.0–0.7)
Eosinophils Relative: 0 %
HEMATOCRIT: 32.4 % — AB (ref 36.0–46.0)
Hemoglobin: 10.7 g/dL — ABNORMAL LOW (ref 12.0–15.0)
LYMPHS PCT: 37 %
Lymphs Abs: 1.5 10*3/uL (ref 0.7–4.0)
MCH: 25.2 pg — ABNORMAL LOW (ref 26.0–34.0)
MCHC: 33 g/dL (ref 30.0–36.0)
MCV: 76.4 fL — ABNORMAL LOW (ref 78.0–100.0)
MONO ABS: 0.4 10*3/uL (ref 0.1–1.0)
Monocytes Relative: 10 %
NEUTROS ABS: 2.1 10*3/uL (ref 1.7–7.7)
Neutrophils Relative %: 52 %
Platelets: 238 10*3/uL (ref 150–400)
RBC: 4.24 MIL/uL (ref 3.87–5.11)
RDW: 20.8 % — AB (ref 11.5–15.5)
WBC: 4 10*3/uL (ref 4.0–10.5)

## 2014-12-03 LAB — ABO/RH: ABO/RH(D): O POS

## 2014-12-03 LAB — URINALYSIS, ROUTINE W REFLEX MICROSCOPIC
Bilirubin Urine: NEGATIVE
GLUCOSE, UA: NEGATIVE mg/dL
HGB URINE DIPSTICK: NEGATIVE
KETONES UR: NEGATIVE mg/dL
Nitrite: NEGATIVE
PROTEIN: NEGATIVE mg/dL
Specific Gravity, Urine: <1.005 — ABNORMAL LOW (ref 1.005–1.030)
Urobilinogen, UA: 0.2 mg/dL (ref 0.0–1.0)
pH: 6 (ref 5.0–8.0)

## 2014-12-03 LAB — WET PREP, GENITAL
Clue Cells Wet Prep HPF POC: NONE SEEN
TRICH WET PREP: NONE SEEN
YEAST WET PREP: NONE SEEN

## 2014-12-03 LAB — URINE MICROSCOPIC-ADD ON

## 2014-12-03 LAB — HCG, QUANTITATIVE, PREGNANCY: hCG, Beta Chain, Quant, S: 8955 m[IU]/mL — ABNORMAL HIGH (ref <5)

## 2014-12-03 LAB — POCT PREGNANCY, URINE: Preg Test, Ur: POSITIVE — AB

## 2014-12-03 NOTE — MAU Note (Signed)
Lower abd cramping since last week.  LMP was 9/23, had pos HPT on Monday - states line was very faint.  Denies bleeding or discharge.

## 2014-12-03 NOTE — Discharge Instructions (Signed)
First Trimester of Pregnancy °The first trimester of pregnancy is from week 1 until the end of week 12 (months 1 through 3). A week after a sperm fertilizes an egg, the egg will implant on the wall of the uterus. This embryo will begin to develop into a baby. Genes from you and your partner are forming the baby. The female genes determine whether the baby is a boy or a girl. At 6-8 weeks, the eyes and face are formed, and the heartbeat can be seen on ultrasound. At the end of 12 weeks, all the baby's organs are formed.  °Now that you are pregnant, you will want to do everything you can to have a healthy baby. Two of the most important things are to get good prenatal care and to follow your health care provider's instructions. Prenatal care is all the medical care you receive before the baby's birth. This care will help prevent, find, and treat any problems during the pregnancy and childbirth. °BODY CHANGES °Your body goes through many changes during pregnancy. The changes vary from woman to woman.  °· You may gain or lose a couple of pounds at first. °· You may feel sick to your stomach (nauseous) and throw up (vomit). If the vomiting is uncontrollable, call your health care provider. °· You may tire easily. °· You may develop headaches that can be relieved by medicines approved by your health care provider. °· You may urinate more often. Painful urination may mean you have a bladder infection. °· You may develop heartburn as a result of your pregnancy. °· You may develop constipation because certain hormones are causing the muscles that push waste through your intestines to slow down. °· You may develop hemorrhoids or swollen, bulging veins (varicose veins). °· Your breasts may begin to grow larger and become tender. Your nipples may stick out more, and the tissue that surrounds them (areola) may become darker. °· Your gums may bleed and may be sensitive to brushing and flossing. °· Dark spots or blotches (chloasma,  mask of pregnancy) may develop on your face. This will likely fade after the baby is born. °· Your menstrual periods will stop. °· You may have a loss of appetite. °· You may develop cravings for certain kinds of food. °· You may have changes in your emotions from day to day, such as being excited to be pregnant or being concerned that something may go wrong with the pregnancy and baby. °· You may have more vivid and strange dreams. °· You may have changes in your hair. These can include thickening of your hair, rapid growth, and changes in texture. Some women also have hair loss during or after pregnancy, or hair that feels dry or thin. Your hair will most likely return to normal after your baby is born. °WHAT TO EXPECT AT YOUR PRENATAL VISITS °During a routine prenatal visit: °· You will be weighed to make sure you and the baby are growing normally. °· Your blood pressure will be taken. °· Your abdomen will be measured to track your baby's growth. °· The fetal heartbeat will be listened to starting around week 10 or 12 of your pregnancy. °· Test results from any previous visits will be discussed. °Your health care provider may ask you: °· How you are feeling. °· If you are feeling the baby move. °· If you have had any abnormal symptoms, such as leaking fluid, bleeding, severe headaches, or abdominal cramping. °· If you are using any tobacco products,   including cigarettes, chewing tobacco, and electronic cigarettes. °· If you have any questions. °Other tests that may be performed during your first trimester include: °· Blood tests to find your blood type and to check for the presence of any previous infections. They will also be used to check for low iron levels (anemia) and Rh antibodies. Later in the pregnancy, blood tests for diabetes will be done along with other tests if problems develop. °· Urine tests to check for infections, diabetes, or protein in the urine. °· An ultrasound to confirm the proper growth  and development of the baby. °· An amniocentesis to check for possible genetic problems. °· Fetal screens for spina bifida and Down syndrome. °· You may need other tests to make sure you and the baby are doing well. °· HIV (human immunodeficiency virus) testing. Routine prenatal testing includes screening for HIV, unless you choose not to have this test. °HOME CARE INSTRUCTIONS  °Medicines °· Follow your health care provider's instructions regarding medicine use. Specific medicines may be either safe or unsafe to take during pregnancy. °· Take your prenatal vitamins as directed. °· If you develop constipation, try taking a stool softener if your health care provider approves. °Diet °· Eat regular, well-balanced meals. Choose a variety of foods, such as meat or vegetable-based protein, fish, milk and low-fat dairy products, vegetables, fruits, and whole grain breads and cereals. Your health care provider will help you determine the amount of weight gain that is right for you. °· Avoid raw meat and uncooked cheese. These carry germs that can cause birth defects in the baby. °· Eating four or five small meals rather than three large meals a day may help relieve nausea and vomiting. If you start to feel nauseous, eating a few soda crackers can be helpful. Drinking liquids between meals instead of during meals also seems to help nausea and vomiting. °· If you develop constipation, eat more high-fiber foods, such as fresh vegetables or fruit and whole grains. Drink enough fluids to keep your urine clear or pale yellow. °Activity and Exercise °· Exercise only as directed by your health care provider. Exercising will help you: °¨ Control your weight. °¨ Stay in shape. °¨ Be prepared for labor and delivery. °· Experiencing pain or cramping in the lower abdomen or low back is a good sign that you should stop exercising. Check with your health care provider before continuing normal exercises. °· Try to avoid standing for long  periods of time. Move your legs often if you must stand in one place for a long time. °· Avoid heavy lifting. °· Wear low-heeled shoes, and practice good posture. °· You may continue to have sex unless your health care provider directs you otherwise. °Relief of Pain or Discomfort °· Wear a good support bra for breast tenderness.   °· Take warm sitz baths to soothe any pain or discomfort caused by hemorrhoids. Use hemorrhoid cream if your health care provider approves.   °· Rest with your legs elevated if you have leg cramps or low back pain. °· If you develop varicose veins in your legs, wear support hose. Elevate your feet for 15 minutes, 3-4 times a day. Limit salt in your diet. °Prenatal Care °· Schedule your prenatal visits by the twelfth week of pregnancy. They are usually scheduled monthly at first, then more often in the last 2 months before delivery. °· Write down your questions. Take them to your prenatal visits. °· Keep all your prenatal visits as directed by your   health care provider. °Safety °· Wear your seat belt at all times when driving. °· Make a list of emergency phone numbers, including numbers for family, friends, the hospital, and police and fire departments. °General Tips °· Ask your health care provider for a referral to a local prenatal education class. Begin classes no later than at the beginning of month 6 of your pregnancy. °· Ask for help if you have counseling or nutritional needs during pregnancy. Your health care provider can offer advice or refer you to specialists for help with various needs. °· Do not use hot tubs, steam rooms, or saunas. °· Do not douche or use tampons or scented sanitary pads. °· Do not cross your legs for long periods of time. °· Avoid cat litter boxes and soil used by cats. These carry germs that can cause birth defects in the baby and possibly loss of the fetus by miscarriage or stillbirth. °· Avoid all smoking, herbs, alcohol, and medicines not prescribed by  your health care provider. Chemicals in these affect the formation and growth of the baby. °· Do not use any tobacco products, including cigarettes, chewing tobacco, and electronic cigarettes. If you need help quitting, ask your health care provider. You may receive counseling support and other resources to help you quit. °· Schedule a dentist appointment. At home, brush your teeth with a soft toothbrush and be gentle when you floss. °SEEK MEDICAL CARE IF:  °· You have dizziness. °· You have mild pelvic cramps, pelvic pressure, or nagging pain in the abdominal area. °· You have persistent nausea, vomiting, or diarrhea. °· You have a bad smelling vaginal discharge. °· You have pain with urination. °· You notice increased swelling in your face, hands, legs, or ankles. °SEEK IMMEDIATE MEDICAL CARE IF:  °· You have a fever. °· You are leaking fluid from your vagina. °· You have spotting or bleeding from your vagina. °· You have severe abdominal cramping or pain. °· You have rapid weight gain or loss. °· You vomit blood or material that looks like coffee grounds. °· You are exposed to German measles and have never had them. °· You are exposed to fifth disease or chickenpox. °· You develop a severe headache. °· You have shortness of breath. °· You have any kind of trauma, such as from a fall or a car accident. °  °This information is not intended to replace advice given to you by your health care provider. Make sure you discuss any questions you have with your health care provider. °  °Document Released: 01/16/2001 Document Revised: 02/12/2014 Document Reviewed: 12/02/2012 °Elsevier Interactive Patient Education ©2016 [employer] °Prenatal Care Providers °[facility]    [facility] ° Phone- [phone]     Phone: [phone] °         °[facility]                      [facility] ° @[place]     Phone: [phone] ° Phone: [phone] °        [facility] °[facility]     Phone: [phone] ° Phone: [phone]   °        [facility] °[facility] [place]                hone: [phone] ° Phone: [phone] °          [facility] °Dr. Bernard Marshall      Phone: [phone] ° Phone: [phone] °        [facility] °[facility]                Phone: [phone] ° Women’s [facility]  ° Phone:[phone]    [facility] ([place]) °         Phone: [phone] °[facility] °  Phone: [phone] °

## 2014-12-03 NOTE — MAU Provider Note (Signed)
History     CSN: [id number]  Arrival date and time: 12/03/14 1023   First Provider Initiated Contact with Patient 12/03/14 1144      Chief Complaint  Patient presents with  . Abdominal Pain   HPI April Rodgers is a 29 y.o. G3P1001 at [redacted]w[redacted]d who presents to MAU today with complaint of cramping. The patient states 1 week of lower abdominal cramping. The pain is mild and intermittent. She denies vaginal bleeding, discharge, UTI symptoms or fever. She has had nausea and constipation without vomiting or diarrhea. She states LMP 10/29/14 and +HPT recently at home.   OB History    Gravida Para Term Preterm AB TAB SAB Ectopic Multiple Living   3 1 1  0 0 0 0 0 0 1      Past Medical History  Diagnosis Date  . Heart murmur   . Anemia     Past Surgical History  Procedure Laterality Date  . Tonsillectomy      No family history on file.  Social History  Substance Use Topics  . Smoking status: Never Smoker   . Smokeless tobacco: Not on file  . Alcohol Use: No    Allergies:  Allergies  Allergen Reactions  . Penicillins Hives and Rash    Prescriptions prior to admission  Medication Sig Dispense Refill Last Dose  . cephALEXin (KEFLEX) 500 MG capsule Take 1 capsule (500 mg total) by mouth 4 (four) times daily. 28 capsule 0   . clindamycin (CLEOCIN) 150 MG capsule Take 150 mg by mouth 4 (four) times daily.  0 06/22/2014 at 2200  . Guaifenesin 1200 MG TB12 Take 1 tablet (1,200 mg total) by mouth 2 (two) times daily. (Patient not taking: Reported on 02/21/2014) 20 each 0 Not Taking at Unknown time  . ibuprofen (ADVIL,MOTRIN) 200 MG tablet Take 200 mg by mouth every 6 (six) hours as needed for moderate pain (migraine).    06/18/2014  . oxyCODONE-acetaminophen (PERCOCET/ROXICET) 5-325 MG per tablet Take 1 tablet by mouth every 6 (six) hours as needed for moderate pain or severe pain.   0 06/19/2014  . phenazopyridine (PYRIDIUM) 200 MG tablet Take 1 tablet (200 mg total) by mouth 3  (three) times daily. (Patient not taking: Reported on 02/21/2014) 6 tablet 0 Not Taking at Unknown time  . predniSONE (DELTASONE) 50 MG tablet Take 1 tablet (50 mg total) by mouth daily. (Patient not taking: Reported on 02/21/2014) 5 tablet 0 Not Taking at Unknown time  . promethazine-dextromethorphan (PROMETHAZINE-DM) 6.25-15 MG/5ML syrup Take 5 mLs by mouth 4 (four) times daily as needed for cough. (Patient not taking: Reported on 02/21/2014) 120 mL 0 Not Taking at Unknown time  . sulfamethoxazole-trimethoprim (SEPTRA DS) 800-160 MG per tablet Take 1 tablet by mouth 2 (two) times daily. (Patient not taking: Reported on 02/21/2014) 10 tablet 0 Not Taking at Unknown time    Review of Systems  Constitutional: Negative for fever and malaise/fatigue.  Gastrointestinal: Positive for nausea, abdominal pain and constipation. Negative for vomiting and diarrhea.  Genitourinary: Negative for dysuria, urgency and frequency.       Neg - vaginal bleeding, discharge   Physical Exam   Blood pressure 119/66, pulse 85, temperature 98.3 F (36.8 C), temperature source Oral, resp. rate 16, height 5\' 9"  (1.753 m), weight 140 lb (63.504 kg), last menstrual period 10/29/2014, unknown if currently breastfeeding.  Physical Exam  Nursing note and vitals reviewed. Constitutional: She is oriented to person, place, and time. She appears well-developed  and well-nourished. No distress.  HENT:  Head: Normocephalic and atraumatic.  Cardiovascular: Normal rate.   Respiratory: Effort normal.  GI: Soft.  Neurological: She is alert and oriented to person, place, and time.  Skin: Skin is warm and dry. No erythema.  Psychiatric: She has a normal mood and affect.   Results for orders placed or performed during the hospital encounter of 12/03/14 (from the past 24 hour(s))  Urinalysis, Routine w reflex microscopic (not at [facility])     Status: Abnormal   Collection Time: 12/03/14 10:45 AM  Result Value Ref Range   Color, Urine  YELLOW YELLOW   APPearance CLEAR CLEAR   Specific Gravity, Urine <1.005 (L) 1.005 - 1.030   pH 6.0 5.0 - 8.0   Glucose, UA NEGATIVE NEGATIVE mg/dL   Hgb urine dipstick NEGATIVE NEGATIVE   Bilirubin Urine NEGATIVE NEGATIVE   Ketones, ur NEGATIVE NEGATIVE mg/dL   Protein, ur NEGATIVE NEGATIVE mg/dL   Urobilinogen, UA 0.2 0.0 - 1.0 mg/dL   Nitrite NEGATIVE NEGATIVE   Leukocytes, UA TRACE (A) NEGATIVE  Urine microscopic-add on     Status: None   Collection Time: 12/03/14 10:45 AM  Result Value Ref Range   Squamous Epithelial / LPF RARE RARE   WBC, UA 0-2 <3 WBC/hpf  Pregnancy, urine POC     Status: Abnormal   Collection Time: 12/03/14 10:51 AM  Result Value Ref Range   Preg Test, Ur POSITIVE (A) NEGATIVE    MAU Course  Procedures None  MDM +UPT UA, wet prep, GC/chlamydia, CBC, ABO/Rh, quant hCG, HIV, RPR and [place] today to rule out ectopic pregnancy 1150 - Patient waiting for [place]. Labs pending. Care turned over to CNM  Marny Lowenstein, PA-C  12/03/2014, 11:56 AM  Assessment and Plan   Results for orders placed or performed during the hospital encounter of 12/03/14 (from the past 24 hour(s))  Urinalysis, Routine w reflex microscopic (not at [facility])     Status: Abnormal   Collection Time: 12/03/14 10:45 AM  Result Value Ref Range   Color, Urine YELLOW YELLOW   APPearance CLEAR CLEAR   Specific Gravity, Urine <1.005 (L) 1.005 - 1.030   pH 6.0 5.0 - 8.0   Glucose, UA NEGATIVE NEGATIVE mg/dL   Hgb urine dipstick NEGATIVE NEGATIVE   Bilirubin Urine NEGATIVE NEGATIVE   Ketones, ur NEGATIVE NEGATIVE mg/dL   Protein, ur NEGATIVE NEGATIVE mg/dL   Urobilinogen, UA 0.2 0.0 - 1.0 mg/dL   Nitrite NEGATIVE NEGATIVE   Leukocytes, UA TRACE (A) NEGATIVE  Urine microscopic-add on     Status: None   Collection Time: 12/03/14 10:45 AM  Result Value Ref Range   Squamous Epithelial / LPF RARE RARE   WBC, UA 0-2 <3 WBC/hpf  Pregnancy, urine POC     Status: Abnormal   Collection Time: 12/03/14  10:51 AM  Result Value Ref Range   Preg Test, Ur POSITIVE (A) NEGATIVE  CBC with Differential/Platelet     Status: Abnormal   Collection Time: 12/03/14 11:51 AM  Result Value Ref Range   WBC 4.0 4.0 - 10.5 K/uL   RBC 4.24 3.87 - 5.11 MIL/uL   Hemoglobin 10.7 (L) 12.0 - 15.0 g/dL   HCT 16.1 (L) 09.6 - 04.5 %   MCV 76.4 (L) 78.0 - 100.0 fL   MCH 25.2 (L) 26.0 - 34.0 pg   MCHC 33.0 30.0 - 36.0 g/dL   RDW 40.9 (H) 81.1 - 91.4 %   Platelets 238 150 - 400  K/uL   Neutrophils Relative % 52 %   Neutro Abs 2.1 1.7 - 7.7 K/uL   Lymphocytes Relative 37 %   Lymphs Abs 1.5 0.7 - 4.0 K/uL   Monocytes Relative 10 %   Monocytes Absolute 0.4 0.1 - 1.0 K/uL   Eosinophils Relative 0 %   Eosinophils Absolute 0.0 0.0 - 0.7 K/uL   Basophils Relative 1 %   Basophils Absolute 0.0 0.0 - 0.1 K/uL  hCG, quantitative, pregnancy     Status: Abnormal   Collection Time: 12/03/14 11:51 AM  Result Value Ref Range   hCG, Beta Chain, Quant, S 8955 (H) <5 mIU/mL  ABO/Rh     Status: None   Collection Time: 12/03/14 11:52 AM  Result Value Ref Range   ABO/RH(D) O POS   Wet prep, genital     Status: Abnormal   Collection Time: 12/03/14 12:10 PM  Result Value Ref Range   Yeast Wet Prep HPF POC NONE SEEN NONE SEEN   Trich, Wet Prep NONE SEEN NONE SEEN   Clue Cells Wet Prep HPF POC NONE SEEN NONE SEEN   WBC, Wet Prep HPF POC MODERATE (A) NONE SEEN   US Ob Comp Less 14 Wks  12/03/2014  CLINICAL DATA:  Abdominal pain in early pregnancy. Gestational age by LMP of 5 weeks 0 days. EXAM: OBSTETRIC <14 WK [place] AND TRANSVAGINAL OB US TECHNIQUE: Both transabdominal and transvaginal ultrasound examinations were performed for complete evaluation of the gestation as well as the maternal uterus, adnexal regions, and pelvic cul-de-sac. Transvaginal technique was performed to assess early pregnancy. COMPARISON:  None. FINDINGS: Intrauterine gestational sac: Visualized/normal in shape. Yolk sac:  Visualized Embryo:  Not visualized  MSD: 7  mm   5 w   3  d Maternal uterus/adnexae: Moderate subchorionic hemorrhage noted. Left ovary is normal appearance. Right ovary not directly visualized, however no adnexal mass identified. IMPRESSION: Single intrauterine gestational sac measuring 5 weeks 3 days by mean sac diameter. This is concordant with LMP. Recommend followup of quantitative HCG levels, and consider followup ultrasound to assess viability in 10 days. Moderate subchorionic hemorrhage. Electronically Signed   By: Myles Rosenthal M.D.   On: 12/03/2014 12:59   US Ob Transvaginal  12/03/2014  CLINICAL DATA:  Abdominal pain in early pregnancy. Gestational age by LMP of 5 weeks 0 days. EXAM: OBSTETRIC <14 WK [place] AND TRANSVAGINAL OB US TECHNIQUE: Both transabdominal and transvaginal ultrasound examinations were performed for complete evaluation of the gestation as well as the maternal uterus, adnexal regions, and pelvic cul-de-sac. Transvaginal technique was performed to assess early pregnancy. COMPARISON:  None. FINDINGS: Intrauterine gestational sac: Visualized/normal in shape. Yolk sac:  Visualized Embryo:  Not visualized MSD: 7  mm   5 w   3  d Maternal uterus/adnexae: Moderate subchorionic hemorrhage noted. Left ovary is normal appearance. Right ovary not directly visualized, however no adnexal mass identified. IMPRESSION: Single intrauterine gestational sac measuring 5 weeks 3 days by mean sac diameter. This is concordant with LMP. Recommend followup of quantitative HCG levels, and consider followup ultrasound to assess viability in 10 days. Moderate subchorionic hemorrhage. Electronically Signed   By: Myles Rosenthal M.D.   On: 12/03/2014 12:59    A:  Pregnancy at [redacted]w[redacted]d        Yolk sac seen on ultrasound, effectively rules out ectopic pregnancy        P:  Discussed findings with patient      Discharge home  List of prenatal providers given       Encouraged to seek early prenatal care

## 2014-12-04 LAB — RPR: RPR: NONREACTIVE

## 2014-12-04 LAB — HIV ANTIBODY (ROUTINE TESTING W REFLEX): HIV Screen 4th Generation wRfx: NONREACTIVE

## 2014-12-06 LAB — GC/CHLAMYDIA PROBE AMP (~~LOC~~) NOT AT ARMC
Chlamydia: NEGATIVE
Neisseria Gonorrhea: NEGATIVE

## 2015-01-12 ENCOUNTER — Other Ambulatory Visit: Payer: Self-pay | Admitting: Obstetrics and Gynecology

## 2015-01-12 DIAGNOSIS — O418X12 Other specified disorders of amniotic fluid and membranes, first trimester, fetus 2: Secondary | ICD-10-CM

## 2015-01-14 ENCOUNTER — Encounter (HOSPITAL_COMMUNITY): Payer: Self-pay

## 2015-01-14 ENCOUNTER — Ambulatory Visit (HOSPITAL_COMMUNITY)
Admission: RE | Admit: 2015-01-14 | Discharge: 2015-01-14 | Disposition: A | Discharge status: Home / Self Care | Payer: Managed Care, Other (non HMO) | Source: Ambulatory Visit | Attending: Obstetrics and Gynecology | Admitting: Obstetrics and Gynecology

## 2015-01-14 VITALS — BP 110/74 | HR 80 | Wt 139.4 lb

## 2015-01-14 DIAGNOSIS — O30031 Twin pregnancy, monochorionic/diamniotic, first trimester: Secondary | ICD-10-CM | POA: Diagnosis not present

## 2015-01-14 DIAGNOSIS — Z3A11 11 weeks gestation of pregnancy: Secondary | ICD-10-CM | POA: Insufficient documentation

## 2015-01-14 DIAGNOSIS — O418X12 Other specified disorders of amniotic fluid and membranes, first trimester, fetus 2: Secondary | ICD-10-CM

## 2015-01-14 DIAGNOSIS — O283 Abnormal ultrasonic finding on antenatal screening of mother: Secondary | ICD-10-CM | POA: Insufficient documentation

## 2015-01-14 NOTE — Progress Notes (Signed)
MFM Consultation, Staff Note:  Ms. April Rodgers  had an ultrasound appointment today.  Please see [facility]-OB/[facility] report for details.   Impression Active monochorionic diamniotic twins.   Twin A: No apparent dysmorphic features are identified  Fetal morphology is gestational age appropriate  Twin B: There is a thickened band of tissue emanating from the anterior to posterior uterine wall that narrows to a thin band as it traverses twin B's head across the mandible and maxillary area of the face.  The twin's head is entrapped.  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  Amniotic band syndrome typically involves entrapment of fetal part(s) that as these parts grow, the circulation to/from is impaired leading to edematous changes, poor circulation and ultimately amputation.  Given the location of this band across the fetal head, this was a difficult conversation to have with her.  I reviewed the findings with my partner, Dr. Sherrie Georgeecker, who agreed that there is no intervention this early in gestation and that my recommendations for close surveillance is the best course of action.  Hence, I have arranged for weekly surveillance of this twin pair.  The patient understands risk for loss of viability in Twin B which also places Twin A at risk given the monochorionicity.  Our group will continue to review her case and manage accordingly as this pregnancy evolves.  Recommendations Weekly surveillance by ultrasound of viability/hydrops/amniotic band syndrome. Further recommendations will be made as this evolves including the usual surveillance for mo-di twin pair.  Time Spent: I  spent in excess of 30 minutes in consultation with this patient to review records, evaluate her case, and provide her with an adequate discussion and education.  More than 50% of this time was spent in direct face-to-face counseling.  It was a pleasure seeing your patient in the office today.  Thank you for consultation. Please do not hesitate to contact our service for any further questions.   Rogelia Bogaenney, Jeffrey Morgan, MD, MS, FACOG Assistant Professor Section of Maternal-Fetal Medicine [facility]

## 2015-01-21 ENCOUNTER — Ambulatory Visit (HOSPITAL_COMMUNITY): Payer: Managed Care, Other (non HMO)

## 2015-01-24 ENCOUNTER — Ambulatory Visit (HOSPITAL_COMMUNITY)
Admission: RE | Admit: 2015-01-24 | Payer: Managed Care, Other (non HMO) | Source: Ambulatory Visit | Attending: Obstetrics and Gynecology | Admitting: Obstetrics and Gynecology

## 2015-01-28 ENCOUNTER — Other Ambulatory Visit (HOSPITAL_COMMUNITY): Payer: Self-pay | Admitting: Obstetrics and Gynecology

## 2015-01-28 ENCOUNTER — Ambulatory Visit (HOSPITAL_COMMUNITY)
Admission: RE | Admit: 2015-01-28 | Discharge: 2015-01-28 | Disposition: A | Discharge status: Home / Self Care | Payer: Managed Care, Other (non HMO) | Source: Ambulatory Visit | Attending: Obstetrics and Gynecology | Admitting: Obstetrics and Gynecology

## 2015-01-28 ENCOUNTER — Encounter (HOSPITAL_COMMUNITY): Payer: Self-pay

## 2015-01-28 DIAGNOSIS — O283 Abnormal ultrasonic finding on antenatal screening of mother: Secondary | ICD-10-CM | POA: Diagnosis not present

## 2015-01-28 DIAGNOSIS — O30031 Twin pregnancy, monochorionic/diamniotic, first trimester: Secondary | ICD-10-CM | POA: Diagnosis present

## 2015-01-28 DIAGNOSIS — O418X12 Other specified disorders of amniotic fluid and membranes, first trimester, fetus 2: Secondary | ICD-10-CM

## 2015-01-28 DIAGNOSIS — Z3A13 13 weeks gestation of pregnancy: Secondary | ICD-10-CM | POA: Diagnosis not present

## 2015-02-04 ENCOUNTER — Encounter (HOSPITAL_COMMUNITY): Payer: Self-pay

## 2015-02-04 ENCOUNTER — Ambulatory Visit (HOSPITAL_COMMUNITY)
Admission: RE | Admit: 2015-02-04 | Discharge: 2015-02-04 | Disposition: A | Discharge status: Home / Self Care | Payer: Managed Care, Other (non HMO) | Source: Ambulatory Visit | Attending: Obstetrics and Gynecology | Admitting: Obstetrics and Gynecology

## 2015-02-04 VITALS — BP 111/68 | HR 85 | Wt 141.0 lb

## 2015-02-04 DIAGNOSIS — O30031 Twin pregnancy, monochorionic/diamniotic, first trimester: Secondary | ICD-10-CM

## 2015-02-04 DIAGNOSIS — Z3A14 14 weeks gestation of pregnancy: Secondary | ICD-10-CM | POA: Insufficient documentation

## 2015-02-04 DIAGNOSIS — O30032 Twin pregnancy, monochorionic/diamniotic, second trimester: Secondary | ICD-10-CM | POA: Insufficient documentation

## 2015-02-04 DIAGNOSIS — O283 Abnormal ultrasonic finding on antenatal screening of mother: Secondary | ICD-10-CM | POA: Insufficient documentation

## 2015-02-04 DIAGNOSIS — O418X12 Other specified disorders of amniotic fluid and membranes, first trimester, fetus 2: Secondary | ICD-10-CM

## 2015-02-06 NOTE — L&D Delivery Note (Addendum)
Delivery Note 0209: Nurse call reports patient with spontaneous delivery of first fetus.  In room to assess and fetal head, chest, and stomach out of vagina while hips and feet within introitus.  Patient encouraged to push with cramping and provider able to grasp hips of fetus and deliver remainder of body without difficulty.  However, after expulsion of fetal legs 2nd amniotic sac descended and proturding out of introitus.  Cord, of Fetus B, cut and clamped.  Fetus taken to warmer.  Patient reports that she wants to hold both infants at the same time.  Patient encouraged to rest as she reports cramping has resolved.  MD in building and updated on status.  Care assumed by Dr. Delrae Sawyers.   At 0155  a non-viable female was delivered via Vaginal Delivery (Presentation: Assumed Vertex ).  APGAR: 0,0; weight  Pending.    Cherre Robins MSN, CNM 03/10/2015, 2:14 AM  I assumed care from [facility] Upon arrival Baby B had delivered. Baby A within sac sitting in vagina.. Pt pushed with Delivery of Twin A nonviable at 217 APGARS 0 and 0 at 1 and 5 minutes respectively. She was given 1 amp of hemabate IM and  Pitocin. The placenta was delivered at 246 AM. It appeared intact.     Anesthesia:  None Episiotomy:  None Lacerations:  None Suture Repair: None Est. Blood Loss (mL):  250 mL  Mom to [facility].  Baby to [place].

## 2015-02-09 ENCOUNTER — Encounter (HOSPITAL_COMMUNITY): Payer: Self-pay

## 2015-02-11 ENCOUNTER — Encounter (HOSPITAL_COMMUNITY): Payer: Self-pay

## 2015-02-11 ENCOUNTER — Ambulatory Visit (HOSPITAL_COMMUNITY)
Admission: RE | Admit: 2015-02-11 | Discharge: 2015-02-11 | Disposition: A | Discharge status: Home / Self Care | Payer: Managed Care, Other (non HMO) | Source: Ambulatory Visit | Attending: Obstetrics and Gynecology | Admitting: Obstetrics and Gynecology

## 2015-02-11 DIAGNOSIS — O283 Abnormal ultrasonic finding on antenatal screening of mother: Secondary | ICD-10-CM | POA: Insufficient documentation

## 2015-02-11 DIAGNOSIS — O418X12 Other specified disorders of amniotic fluid and membranes, first trimester, fetus 2: Secondary | ICD-10-CM

## 2015-02-11 DIAGNOSIS — Z3A15 15 weeks gestation of pregnancy: Secondary | ICD-10-CM | POA: Insufficient documentation

## 2015-02-11 DIAGNOSIS — O30032 Twin pregnancy, monochorionic/diamniotic, second trimester: Secondary | ICD-10-CM | POA: Insufficient documentation

## 2015-02-11 DIAGNOSIS — O30031 Twin pregnancy, monochorionic/diamniotic, first trimester: Secondary | ICD-10-CM

## 2015-02-18 ENCOUNTER — Ambulatory Visit (HOSPITAL_COMMUNITY)
Admission: RE | Admit: 2015-02-18 | Discharge: 2015-02-18 | Disposition: A | Discharge status: Home / Self Care | Payer: Managed Care, Other (non HMO) | Source: Ambulatory Visit | Attending: Obstetrics and Gynecology | Admitting: Obstetrics and Gynecology

## 2015-02-18 DIAGNOSIS — O30032 Twin pregnancy, monochorionic/diamniotic, second trimester: Secondary | ICD-10-CM | POA: Insufficient documentation

## 2015-02-18 DIAGNOSIS — O283 Abnormal ultrasonic finding on antenatal screening of mother: Secondary | ICD-10-CM | POA: Diagnosis not present

## 2015-02-18 DIAGNOSIS — Z3A16 16 weeks gestation of pregnancy: Secondary | ICD-10-CM | POA: Diagnosis not present

## 2015-03-03 ENCOUNTER — Observation Stay (HOSPITAL_COMMUNITY)
Admission: AD | Admit: 2015-03-03 | Discharge: 2015-03-04 | Disposition: A | Discharge status: Home / Self Care | Payer: Managed Care, Other (non HMO) | Source: Ambulatory Visit | Attending: Obstetrics and Gynecology | Admitting: Obstetrics and Gynecology

## 2015-03-03 ENCOUNTER — Inpatient Hospital Stay (HOSPITAL_COMMUNITY): Payer: Managed Care, Other (non HMO)

## 2015-03-03 ENCOUNTER — Encounter (HOSPITAL_COMMUNITY): Payer: Self-pay | Admitting: *Deleted

## 2015-03-03 DIAGNOSIS — O99412 Diseases of the circulatory system complicating pregnancy, second trimester: Secondary | ICD-10-CM | POA: Diagnosis not present

## 2015-03-03 DIAGNOSIS — O99012 Anemia complicating pregnancy, second trimester: Secondary | ICD-10-CM | POA: Diagnosis not present

## 2015-03-03 DIAGNOSIS — D649 Anemia, unspecified: Secondary | ICD-10-CM | POA: Diagnosis not present

## 2015-03-03 DIAGNOSIS — Z3A17 17 weeks gestation of pregnancy: Secondary | ICD-10-CM | POA: Diagnosis not present

## 2015-03-03 DIAGNOSIS — O42912 Preterm premature rupture of membranes, unspecified as to length of time between rupture and onset of labor, second trimester: Secondary | ICD-10-CM | POA: Diagnosis not present

## 2015-03-03 DIAGNOSIS — O42919 Preterm premature rupture of membranes, unspecified as to length of time between rupture and onset of labor, unspecified trimester: Secondary | ICD-10-CM

## 2015-03-03 DIAGNOSIS — I341 Nonrheumatic mitral (valve) prolapse: Secondary | ICD-10-CM | POA: Diagnosis not present

## 2015-03-03 DIAGNOSIS — O30032 Twin pregnancy, monochorionic/diamniotic, second trimester: Secondary | ICD-10-CM | POA: Diagnosis not present

## 2015-03-03 DIAGNOSIS — O4290 Premature rupture of membranes, unspecified as to length of time between rupture and onset of labor, unspecified weeks of gestation: Secondary | ICD-10-CM

## 2015-03-03 LAB — CBC
HEMATOCRIT: 24.8 % — AB (ref 36.0–46.0)
HEMOGLOBIN: 8 g/dL — AB (ref 12.0–15.0)
MCH: 25.2 pg — AB (ref 26.0–34.0)
MCHC: 32.3 g/dL (ref 30.0–36.0)
MCV: 78 fL (ref 78.0–100.0)
Platelets: 223 10*3/uL (ref 150–400)
RBC: 3.18 MIL/uL — AB (ref 3.87–5.11)
RDW: 19 % — ABNORMAL HIGH (ref 11.5–15.5)
WBC: 8.1 10*3/uL (ref 4.0–10.5)

## 2015-03-03 LAB — TYPE AND SCREEN
ABO/RH(D): O POS
Antibody Screen: NEGATIVE

## 2015-03-03 MED ORDER — CALCIUM CARBONATE ANTACID 500 MG PO CHEW: 2.0000 | CHEWABLE_TABLET | ORAL | Status: DC | PRN

## 2015-03-03 MED ORDER — PRENATAL MULTIVITAMIN CH
1.0000 | ORAL_TABLET | Freq: Every day | ORAL | Status: DC
  Administered 2015-03-04: 1 via ORAL
  Filled 2015-03-03: qty 1

## 2015-03-03 MED ORDER — ACETAMINOPHEN 325 MG PO TABS: 650.0000 mg | ORAL_TABLET | ORAL | Status: DC | PRN

## 2015-03-03 MED ORDER — ZOLPIDEM TARTRATE 5 MG PO TABS: 5.0000 mg | ORAL_TABLET | Freq: Every evening | ORAL | Status: DC | PRN

## 2015-03-03 MED ORDER — DOCUSATE SODIUM 100 MG PO CAPS
100.0000 mg | ORAL_CAPSULE | Freq: Every day | ORAL | Status: DC
  Administered 2015-03-04: 100 mg via ORAL
  Filled 2015-03-03: qty 1

## 2015-03-03 NOTE — H&P (Signed)
HPI: 30 y/o G3P1011 @ [redacted]w[redacted]d with mono/di twins admitted for PPROM.  Pt called in to our office late this afternoon complaining of a large gush of warm clear fluid.  She reported further leaking with coughing and when standing.  Denies vaginal bleeding.  No uterine contractions.  +Fetal movement x 2.  ROS: no HA currently- though she has had HA during the pregnancy requiring Fioricet, no epigastric pain, no visual changes.    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  Pt was seen by specialist in [place]- appears to be a uterine synechiae as opposed to band impacting cranial growth.  Per consult note- concern of fetal entrapment should improve as gestation continues.  Last [place] @ 29wk0d- Twin A- vertex, DVP: 5.2cm, Twin B- breech DVP: 4.9cm   Prenatal Transfer Tool  Maternal Diabetes: Not yet tested Genetic Screening: Declined Maternal Ultrasounds/Referrals: Abnormal:  Findings:   Other: see above Fetal Ultrasounds or other Referrals:  Referred to Materal Fetal Medicine  regarding amniotic band vs uterine synechiae Maternal Substance Abuse:  No Significant Maternal Medications:  None Significant Maternal Lab Results: None   PNL:  GBS unknown, Rub Immune, Hep B neg, RPR NR, HIV neg, GC/C neg, glucola:unknown Hgb 10.7 (12/20/14) Blood type: O positive, antibody negative  OBHx: FTNSVD x1, 6#3oz uncomplicated, TAB x1 PMHx:  none Meds:  PNV, Vitamin B6, Phenergan prn Allergy:   Allergies  Allergen Reactions  . Penicillins Hives and Rash    Has patient had a PCN reaction causing immediate rash, facial/tongue/throat swelling, SOB or lightheadedness with hypotension: Yes Has patient had a PCN reaction causing severe rash involving mucus membranes or skin necrosis: No Has patient had a PCN reaction that required hospitalization No Has patient had a PCN reaction occurring within the last 29 years:  No If all of the above answers are "NO", then may proceed with Cephalosporin use.    SurgHx: oral surgery only (tonsils/wisdom teeth) SocHx:   no Tobacco, no  EtOH, no Illicit Drugs  O: BP 120/78 mmHg  Pulse 106  Temp(Src) 97.7 F (36.5 C) (Oral)  Resp 16  LMP 10/29/2014 Gen. AAOx3, NAD CV.  RRR  No murmur.  Resp. CTAB, no wheeze or crackles. Abd. Gravid,  no tenderness,  no rigidity,  no guarding Extr.  no edema B/L , no calf tenderness  FHT: 140/138 by bedside doppler SSE: Performed by Venia Carbon NP in MAU: Grossly ruptured clear fluid.  SVE: 1/30/-3, no well applied  Labs: see orders [place] today: Twin A Vertex/DVP: 3.6cm, Twin B: vertex/DVP: 1.4cm  A/P:  30 y.o. G3P1011 @ [redacted]w[redacted]d EGA who presents for PPROM- of twin B -FWB:  Reassuring by doppler- plan for Dopplers q shift -PPROM Due to previable state plan for MFM consultation in am regarding management plans as well as NICU consult.  May consider close outpatient observation with return to hospital at viability once pt has proven not to be in active labor. -Limited US performed (as above) -Will rule out infection: UA, GBS and CBC with diff.  Clinically no evidence of infection -Plan for observation overnight, Dr. Dion Body to resume care at [facility], DO [phone] (pager) [phone] (office)

## 2015-03-03 NOTE — MAU Note (Signed)
Pt states she went to the [facility] - felt a lot of fluid come out, is still leaking, clear.  Denies bleeding, has pelvic pressure.

## 2015-03-03 NOTE — MAU Provider Note (Signed)
History     CSN: [id number]  Arrival date and time: 03/03/15 1639   First Provider Initiated Contact with Patient 03/03/15 1718      Chief Complaint  Patient presents with  . Rupture of Membranes  . pelvic pressure    HPI   [name] is a 30 y.o. female G3P1011 at [redacted]w[redacted]d with mono/di twins and uterine synechiae (twin B)  presents to MAU with ? ROM and pelvic pressure. This afternoon the patient felt significant pelvic pressure and then shortly after started leaking clear fluid.   + fetal movement Denies vaginal bleeding   OB History    Gravida Para Term Preterm AB TAB SAB Ectopic Multiple Living   0 1 1 0 0 0 1      Past Medical History  Diagnosis Date  . Heart murmur   . Anemia   . Mitral valve prolapse   . Headache     Past Surgical History  Procedure Laterality Date  . Tonsillectomy    . Wisdom tooth extraction      History reviewed. No pertinent family history.  Social History  Substance Use Topics  . Smoking status: Never Smoker   . Smokeless tobacco: None  . Alcohol Use: No    Allergies:  Allergies  Allergen Reactions  . Penicillins Hives and Rash    Has patient had a PCN reaction causing immediate rash, facial/tongue/throat swelling, SOB or lightheadedness with hypotension: Yes Has patient had a PCN reaction causing severe rash involving mucus membranes or skin necrosis: No Has patient had a PCN reaction that required hospitalization No Has patient had a PCN reaction occurring within the last 10 years: No If all of the above answers are "NO", then may proceed with Cephalosporin use.     Prescriptions prior to admission  Medication Sig Dispense Refill Last Dose  . acetaminophen (TYLENOL) 500 MG tablet Take 1,000 mg by mouth every 6 (six) hours as needed for mild pain or headache.   Past Month at Unknown time  . ondansetron (ZOFRAN-ODT) 4 MG disintegrating tablet Take 1 tablet by mouth every 8 (eight) hours as needed.  1 Past Week  at Unknown time  . Prenatal Vit-Fe Fumarate-FA (PRENATAL MULTIVITAMIN) TABS tablet Take 1 tablet by mouth daily at 12 noon.   03/02/2015 at Unknown time  . promethazine (PHENERGAN) 25 MG tablet Take 0.5-1 tablets by mouth every 6 (six) hours as needed.  3 03/02/2015 at Unknown time  . vitamin B-6 (PYRIDOXINE) 25 MG tablet Take 1 tablet by mouth 3 (three) times daily.  0 03/03/2015 at Unknown time  . WAL-SOM 25 MG tablet Take 1 tablet by mouth daily.  0 Past Week at Unknown time   No results found for this or any previous visit (from the past 48 hour(s)).  Review of Systems  Constitutional: Negative for fever and chills.  Gastrointestinal: Positive for abdominal pain.   Physical Exam   Blood pressure 120/78, pulse 106, temperature 97.7 F (36.5 C), temperature source Oral, resp. rate 16, last menstrual period 10/29/2014, unknown if currently breastfeeding.  Physical Exam  Constitutional: She is oriented to person, place, and time. She appears well-developed and well-nourished.  Genitourinary:  Speculum exam: Vagina - large amount of clear fluid pooling in the vagina.  Cervix - Difficult to visualize  Bimanual exam: Cervix 1 cm, 30%, posterior  Uterus non tender, enlarged Adnexa non tender, no masses bilaterally Fern slide Chaperone present for exam.  Musculoskeletal: Normal range of  motion.  Neurological: She is alert and oriented to person, place, and time.  Skin: Skin is warm.    MAU Course  Procedures  None  MDM  + fetal heart tones via doppler  1735: Dr. Charlotta Newton at the bedside, MFM [place] ordered  Admit to ante/ labor and delivery per Dr. Charlotta Newton. Dr. Charlotta Newton to be in contact for further orders/recommendations.   Assessment and Plan   A:  1. Preterm premature rupture of membranes (PPROM) with unknown onset of labor       P:  Admit for observation per Dr. Charlotta Newton MFM consulted    Duane Lope, NP 03/03/2015 6:30 PM

## 2015-03-04 ENCOUNTER — Inpatient Hospital Stay (HOSPITAL_COMMUNITY)
Admission: RE | Admit: 2015-03-04 | Discharge: 2015-03-04 | Disposition: A | Discharge status: Home / Self Care | Payer: Managed Care, Other (non HMO) | Source: Ambulatory Visit | Attending: Obstetrics and Gynecology | Admitting: Obstetrics and Gynecology

## 2015-03-04 ENCOUNTER — Observation Stay (HOSPITAL_COMMUNITY): Payer: Managed Care, Other (non HMO)

## 2015-03-04 DIAGNOSIS — D649 Anemia, unspecified: Secondary | ICD-10-CM | POA: Insufficient documentation

## 2015-03-04 DIAGNOSIS — O99412 Diseases of the circulatory system complicating pregnancy, second trimester: Secondary | ICD-10-CM | POA: Insufficient documentation

## 2015-03-04 DIAGNOSIS — O4290 Premature rupture of membranes, unspecified as to length of time between rupture and onset of labor, unspecified weeks of gestation: Secondary | ICD-10-CM

## 2015-03-04 DIAGNOSIS — I341 Nonrheumatic mitral (valve) prolapse: Secondary | ICD-10-CM | POA: Insufficient documentation

## 2015-03-04 DIAGNOSIS — O42912 Preterm premature rupture of membranes, unspecified as to length of time between rupture and onset of labor, second trimester: Principal | ICD-10-CM | POA: Insufficient documentation

## 2015-03-04 DIAGNOSIS — O99012 Anemia complicating pregnancy, second trimester: Secondary | ICD-10-CM | POA: Insufficient documentation

## 2015-03-04 DIAGNOSIS — O30032 Twin pregnancy, monochorionic/diamniotic, second trimester: Secondary | ICD-10-CM | POA: Insufficient documentation

## 2015-03-04 DIAGNOSIS — Z3A17 17 weeks gestation of pregnancy: Secondary | ICD-10-CM | POA: Insufficient documentation

## 2015-03-04 MED ORDER — POLYSACCHARIDE IRON COMPLEX 150 MG PO CAPS: 150.0000 mg | ORAL_CAPSULE | Freq: Every day | ORAL | Status: DC

## 2015-03-04 NOTE — Consult Note (Signed)
Maternal Fetal Medicine Consultation  Requesting Provider(s): Myna Hidalgo, DO  Reason for consultation: MC/DA twins at 18 weeks with suspected PROM  HPI: April Rodgers is a 30 year old G3P1011 currently at 63w 0d with MC/DA twins who was admitted last night for suspected PROM.  She was seen today for further counseling and recommendations for management.  [name] has been followed in the MFM clinic.  On earlier ultrasounds, multiple uterine synechiae were noted and she was sent for evaluation in [place] due to possible fetal entrapment.  Ultimately, this resolved without intervention. Her prenatal course has been complicated by anemia and mitral valve prolapse.    The patient reports a gush of fluid at approximately 4:30 PM yesterday - clear without bleeding.  She denies any uterine contractions currently and has been stable through the night.  OB History: OB History    Gravida Para Term Preterm AB TAB SAB Ectopic Multiple Living   0 1 1 0 0 0 1      PMH:  Past Medical History  Diagnosis Date  . Heart murmur   . Anemia   . Mitral valve prolapse   . Headache     PSH:  Past Surgical History  Procedure Laterality Date  . Tonsillectomy    . Wisdom tooth extraction     Meds:  Current Facility-Administered Medications on File Prior to Encounter  Medication Dose Route Frequency Provider Last Rate Last Dose  . acetaminophen (TYLENOL) tablet 650 mg  650 mg Oral Q4H PRN Duane Lope, NP      . calcium carbonate (TUMS - dosed in mg elemental calcium) chewable tablet 400 mg of elemental calcium  2 tablet Oral Q4H PRN Duane Lope, NP      . docusate sodium (COLACE) capsule 100 mg  100 mg Oral Daily Jennifer I Rasch, NP      . prenatal multivitamin tablet 1 tablet  1 tablet Oral Q1200 Jennifer I Rasch, NP      . zolpidem (AMBIEN) tablet 5 mg  5 mg Oral QHS PRN Duane Lope, NP       Current Outpatient Prescriptions on File Prior to Encounter  Medication Sig  Dispense Refill  . acetaminophen (TYLENOL) 500 MG tablet Take 1,000 mg by mouth every 6 (six) hours as needed for mild pain or headache.    . ondansetron (ZOFRAN-ODT) 4 MG disintegrating tablet Take 1 tablet by mouth every 8 (eight) hours as needed.  1  . Prenatal Vit-Fe Fumarate-FA (PRENATAL MULTIVITAMIN) TABS tablet Take 1 tablet by mouth daily at 12 noon.    . promethazine (PHENERGAN) 25 MG tablet Take 0.5-1 tablets by mouth every 6 (six) hours as needed.  3  . vitamin B-6 (PYRIDOXINE) 25 MG tablet Take 1 tablet by mouth 3 (three) times daily.  0  . WAL-SOM 25 MG tablet Take 1 tablet by mouth daily.  0   Allergies: Allergies  Allergen Reactions  . Penicillins Hives and Rash    Has patient had a PCN reaction causing immediate rash, facial/tongue/throat swelling, SOB or lightheadedness with hypotension: Yes Has patient had a PCN reaction causing severe rash involving mucus membranes or skin necrosis: No Has patient had a PCN reaction that required hospitalization No Has patient had a PCN reaction occurring within the last 10 years: No If all of the above answers are "NO", then may proceed with Cephalosporin use.    FH: Soc:  Social History   Social History  .  Marital Status: Single    Spouse Name: N/A  . Number of Children: N/A  . Years of Education: N/A   Occupational History  . Not on file.   Social History Main Topics  . Smoking status: Never Smoker   . Smokeless tobacco: Not on file  . Alcohol Use: No  . Drug Use: No  . Sexual Activity: Yes    Birth Control/ Protection: None   Other Topics Concern  . Not on file   Social History Narrative    Review of Systems: no vaginal bleeding or cramping/contractions, no LOF, no nausea/vomiting. All other systems reviewed and are negative.  PE:  There were no vitals filed for this visit.  GEN: well-appearing female ABD: gravid, NT  Ultrasound: MC/DA twin gestation at 65w 0d Uterine synechiae - previously noted to have  fetal entrapment that resolved spontaneously Suspected PROM - Twin B  Twin A: Maternal right, cephalic, posterior placenta Normal fetal anatomic survey The estimated fetal weight is at the 58th %tile Bladder visualized Normal amniotic fluid volume (MVP 4.2 cm)  Twin B: Maternal left, cephalic, posterior placenta Limited views of the fetal heart and face obtained The estimated fetal weight is at the 52nd %tile  Oligohydramnios noted (MVP 1.6 cm)   Labs: CBC    Component Value Date/Time   WBC 8.1 03/03/2015 2003   RBC 3.18* 03/03/2015 2003   HGB 8.0* 03/03/2015 2003   HCT 24.8* 03/03/2015 2003   PLT 223 03/03/2015 2003   MCV 78.0 03/03/2015 2003   MCH 25.2* 03/03/2015 2003   MCHC 32.3 03/03/2015 2003   RDW 19.0* 03/03/2015 2003   LYMPHSABS 1.5 12/03/2014 1151   MONOABS 0.4 12/03/2014 1151   EOSABS 0.0 12/03/2014 1151   BASOSABS 0.0 12/03/2014 1151     A/P: 1) MC/DA twin gestation at 18w 0d  2) Uterine synechiae, previous ultrasounds suspicious for fetal entrapment - spontaneously resolved.    3) Suspected PROM of Twin B - somewhat unusual to find isolated PROM of twin B (non-presenting twin) - I am somewhat suspicious that the uterine synechiae in Twin B may have played a role in the rupture of membranes in the non-presenting twin.  I had a long discussion with the couple - we reviewed the risk of preterm delivery, chorioamnionitis, pulmonary hypoplasia in Twin B, and abruption.  In the event that she developed a uterine infection, she would require delivery regardless of the gestational age and that there is the potential of developing sepsis / life-threatening infections after ruptured membranes.  We also briefly discussed delayed interval delivery in the event that Twin B delivered and Twin A remained in utero.  We also briefly discussed the option of termination of pregnancy and that this would be an option until 20w 6d in [place].  The patient and her husband have  elected to undergo continued expectant management.  Recommendations: 1) If stable, would offer hospital discharge today. 2) The patient will need to be out of work.  She does not need to be on bedrest, but would recommend decreased activity. 3) Will need to check temperatures at home very 6-8 hours - should be seen immediately if temp > 100 4) Pelvic rest, no intercourse 5) The patient should be seen in the clinic at least weekly.  My practice is to check CBCs weekly to screen for leukocytosis / early infection. 6) Follow up in the MFM clinic every other week 7) NICU consult 8) Would offer readmission at 22 5/7 weeks  for a course of betamethasone and latency antibiotics (may choose to be admitted a little later based on her desire and conversation with NICU)   Thank you for the opportunity to be a part of the care of April Rodgers. Please contact our office if we can be of further assistance.   I spent approximately 30 minutes with this patient with over 50% of time spent in face-to-face counseling.  Alpha Gula, MD Maternal Fetal Medicine

## 2015-03-04 NOTE — Discharge Instructions (Signed)
Premature Rupture and Preterm Premature Rupture of Membranes Premature rupture of membranes (PROM) is when the membranes (amniotic sac) break open before contractions or labor starts. Rupture of membranes is commonly referred to as your water breaking. If PROM occurs before 37 weeks of pregnancy, it is called preterm premature rupture of membranes (PPROM). The amniotic sac holds the fetus, keeps infection out, and performs other important functions. Having the amniotic sac rupture before 37 weeks of pregnancy can lead to serious problems and requires immediate attention by your health care provider. CAUSES  PROM near the end of the pregnancy may be caused by natural weakening of the membranes. PPROM is often due to an infection. Other factors that may be associated with PROM include:  Stretching of the amniotic sac because of carrying multiples or having too much amniotic fluid.  Trauma.  Smoking during pregnancy.  Poor nutrition.  Previous preterm birth.  Vaginal bleeding.  Little to no prenatal care.  Problems with the placenta, such as placenta previa or placental abruption. RISKS OF PROM AND PPROM  Delivering a premature baby.  Getting a serious infection of the placental tissues (chorioamnionitis).  Early detachment of the placenta from the uterus (placental abruption).  Compression of the umbilical cord.  Needing a cesarean birth.  Developing a serious infection after delivery. SIGNS OF PROM OR PPROM   A sudden gush or slow leaking of fluid from the vagina.  Constant wet underwear. Sometimes, women mistake the leaking or wetness for urine, especially if the leak is slow and not a gush of fluid. If there is constant leaking or your underwear continues to get wet, your membranes have likely ruptured. WHAT TO DO IF YOU THINK YOUR MEMBRANES HAVE RUPTURED Call your health care provider right away. You will need to go to the hospital to get checked immediately. WHAT HAPPENS  IF YOU ARE DIAGNOSED WITH PROM OR PPROM? Once you arrive at the hospital, you will have tests done. A cervical exam will be performed to check if the cervix has softened or started to open (dilate). If you are diagnosed with PROM, you may be induced within 24 hours if you are not having contractions. If you are diagnosed with PPROM and are not having contractions, you may be induced depending on your trimester.  If you have PPROM, you:  And your baby will be monitored closely for signs of infection or other complications.  May be given an antibiotic medicine to lower the chances of an infection developing.  May be given a steroid medicine to help mature the baby's lungs faster.  May be given a medicine to stop preterm labor.  May be ordered to be on bed rest at home or in the hospital.  May be induced if complications arise for you or the baby. Your treatment will depend on many factors, such as how far along you are, the development of the baby, and other complications that may arise.   This information is not intended to replace advice given to you by your health care provider. Make sure you discuss any questions you have with your health care provider.   Document Released: 01/22/2005 Document Revised: 11/12/2012 Document Reviewed: 05/13/2012 Elsevier Interactive Patient Education 2016 [employer].  Preterm Labor Information Preterm labor is when labor starts at less than 37 weeks of pregnancy. The normal length of a pregnancy is 39 to 41 weeks. CAUSES Often, there is no identifiable underlying cause as to why a woman goes into preterm labor. One of  the most common known causes of preterm labor is infection. Infections of the uterus, cervix, vagina, amniotic sac, bladder, kidney, or even the lungs (pneumonia) can cause labor to start. Other suspected causes of preterm labor include:   Urogenital infections, such as yeast infections and bacterial vaginosis.   Uterine abnormalities  (uterine shape, uterine septum, fibroids, or bleeding from the placenta).   A cervix that has been operated on (it may fail to stay closed).   Malformations in the fetus.   Multiple gestations (twins, triplets, and so on).   Breakage of the amniotic sac.  RISK FACTORS  Having a previous history of preterm labor.   Having premature rupture of membranes (PROM).   Having a placenta that covers the opening of the cervix (placenta previa).   Having a placenta that separates from the uterus (placental abruption).   Having a cervix that is too weak to hold the fetus in the uterus (incompetent cervix).   Having too much fluid in the amniotic sac (polyhydramnios).   Taking illegal drugs or smoking while pregnant.   Not gaining enough weight while pregnant.   Being younger than 59 and older than 30 years old.   Having a low socioeconomic status.   Being African American. SYMPTOMS Signs and symptoms of preterm labor include:   Menstrual-like cramps, abdominal pain, or back pain.  Uterine contractions that are regular, as frequent as six in an hour, regardless of their intensity (may be mild or painful).  Contractions that start on the top of the uterus and spread down to the lower abdomen and back.   A sense of increased pelvic pressure.   A watery or bloody mucus discharge that comes from the vagina.  TREATMENT Depending on the length of the pregnancy and other circumstances, your health care provider may suggest bed rest. If necessary, there are medicines that can be given to stop contractions and to mature the fetal lungs. If labor happens before 34 weeks of pregnancy, a prolonged hospital stay may be recommended. Treatment depends on the condition of both you and the fetus.  WHAT SHOULD YOU DO IF YOU THINK YOU ARE IN PRETERM LABOR? Call your health care provider right away. You will need to go to the hospital to get checked immediately. HOW CAN YOU PREVENT  PRETERM LABOR IN FUTURE PREGNANCIES? You should:   Stop smoking if you smoke.  Maintain healthy weight gain and avoid chemicals and drugs that are not necessary.  Be watchful for any type of infection.  Inform your health care provider if you have a known history of preterm labor.   This information is not intended to replace advice given to you by your health care provider. Make sure you discuss any questions you have with your health care provider.   Document Released: 04/14/2003 Document Revised: 09/24/2012 Document Reviewed: 02/25/2012 Elsevier Interactive Patient Education [employer]

## 2015-03-04 NOTE — Discharge Summary (Signed)
Physician Discharge Summary  Patient ID: SYANA DEGRAFFENREID MRN: [id number] DOB/AGE: 1985/03/11 29 y.o.  Admit date: 03/03/2015 Discharge date: 03/04/2015  Admission Diagnoses: IUP @ 18 5/7 weeks PPROM   Discharge Diagnoses: IUP @ 18 6/7 weeks, PPROM Active Problems:   Preterm premature rupture of membranes (PPROM) with unknown onset of labor   Discharged Condition: stable  Hospital Course: Rupture confirmed.  Pt observed, no contractions or bleeding.  FHTs present.  Consults: MFM  Significant Diagnostic Studies: labs: WBC normal and microbiology: GBS  Treatments: Consult  Discharge Exam: Blood pressure 110/61, pulse 97, temperature 98 F (36.7 C), temperature source Oral, resp. rate 16, height  (1.753 m), weight 65.772 kg (145 lb), last menstrual period 10/29/2014, unknown if currently breastfeeding. Gen:  NAD Abd:  No fundal tenderness Ext:  No edema or calf tenderness  Disposition: 01-Home or Self Care  Discharge Instructions    Discharge activity:    Complete by:  As directed   Pelvic rest.     Discharge diet:  No restrictions    Complete by:  As directed      Discharge instructions    Complete by:  As directed   See discharge instructions.     Do not have sex or do anything that might make you have an orgasm    Complete by:  As directed      Notify physician for a general feeling that "something is not right"    Complete by:  As directed      Notify physician for increase or change in vaginal discharge    Complete by:  As directed      Notify physician for intestinal cramps, with or without diarrhea, sometimes described as "gas pain"    Complete by:  As directed      Notify physician for leaking of fluid    Complete by:  As directed      Notify physician for low, dull backache, unrelieved by heat or Tylenol    Complete by:  As directed      Notify physician for menstrual like cramps    Complete by:  As directed      Notify physician for pelvic pressure     Complete by:  As directed      Notify physician for uterine contractions.  These may be painless and feel like the uterus is tightening or the baby is  "balling up"    Complete by:  As directed      Notify physician for vaginal bleeding    Complete by:  As directed      PRETERM LABOR:  Includes any of the follwing symptoms that occur between 20 - [redacted] weeks gestation.  If these symptoms are not stopped, preterm labor can result in preterm delivery, placing your baby at risk    Complete by:  As directed             Medication List    TAKE these medications        acetaminophen 500 MG tablet  Commonly known as:  TYLENOL  Take 1,000 mg by mouth every 6 (six) hours as needed for mild pain or headache.     iron polysaccharides 150 MG capsule  Commonly known as:  NIFEREX  Take 1 capsule (150 mg total) by mouth daily. With food.     ondansetron 4 MG disintegrating tablet  Commonly known as:  ZOFRAN-ODT  Take 1 tablet by mouth every 8 (eight) hours as needed.  prenatal multivitamin Tabs tablet  Take 1 tablet by mouth daily at 12 noon.     promethazine 25 MG tablet  Commonly known as:  PHENERGAN  Take 0.5-1 tablets by mouth every 6 (six) hours as needed.     vitamin B-6 25 MG tablet  Commonly known as:  pyridOXINE  Take 1 tablet by mouth 3 (three) times daily.     WAL-SOM 25 MG tablet  Generic drug:  doxylamine (Sleep)  Take 1 tablet by mouth daily.           Follow-up Information    Follow up with Geryl Rankins, MD. Schedule an appointment as soon as possible for a visit in 1 week.   Specialty:  Obstetrics and Gynecology   Why:  Hospital follow up, Lab work   Contact information:   [address] [place] [address] [phone]       Signed: Geryl Rankins 03/04/2015, 1:29 PM

## 2015-03-04 NOTE — Consult Note (Signed)
Neonatology Consult to Antenatal Patient:  I was asked by Dr. Dion Body to see this patient in order to provide antenatal counseling due to SROM at 18 weeks and twin gestation.  April Rodgers was admitted on 1/26 and is 18 0/[redacted] weeks GA today with mono/di twins. She had gross SROM of Twin B and continues to leak fluid. She is currently not having active labor. The babies are female and the pregnancy has otherwise been normal to date.  I spoke with the patient and her husband. We discussed the development of the lungs in the fetus, which is the limiting factor for choosing 23 weeks as the limit of viability. I explained that some infants born at 23 weeks cannot be intubated due to small size of the vocal cords, and that some do not respond to resuscitation. We discussed what to expect if delivery occurs at 23 weeks, including usual DR management, respiratory complications and need for support, LOS, Mortality and Morbidity, and long term outcomes. I stressed the extremely high rate of poor neurodevelopmental outcome at 23 weeks, still quite high at 24 weeks, but improving significantly and gradually after that. They did not have any questions at this time; most of their questions were in regards to management of the pregnancy, which I deferred to Dr. Dion Body.  I offered a NICU tour to any interested family members and would be glad to come back if they have more questions later.  I let this couple know that the choice as to whether or not to attempt resuscitation at 23-24 weeks is theirs; if they want resuscitation, our team will support their decision starting at 23 weeks. If they want resuscitation at 23 weeks, would recommend Betamethasone beginning at 22 5/7 weeks . Thank you for asking me to see this patient.  April Sou, MD Neonatologist  The total length of face-to-face or floor/unit time for this encounter was 25 minutes. Counseling and/or coordination of care was 15 minutes of the above.

## 2015-03-09 ENCOUNTER — Encounter (HOSPITAL_COMMUNITY): Payer: Self-pay

## 2015-03-09 ENCOUNTER — Inpatient Hospital Stay (HOSPITAL_COMMUNITY)
Admission: AD | Admit: 2015-03-09 | Discharge: 2015-03-11 | DRG: 774 | Disposition: A | Discharge status: Home / Self Care | Payer: Managed Care, Other (non HMO) | Source: Ambulatory Visit | Attending: Obstetrics and Gynecology | Admitting: Obstetrics and Gynecology

## 2015-03-09 DIAGNOSIS — O411222 Chorioamnionitis, second trimester, fetus 2: Secondary | ICD-10-CM | POA: Diagnosis present

## 2015-03-09 DIAGNOSIS — O30002 Twin pregnancy, unspecified number of placenta and unspecified number of amniotic sacs, second trimester: Secondary | ICD-10-CM | POA: Diagnosis present

## 2015-03-09 DIAGNOSIS — Z3A18 18 weeks gestation of pregnancy: Secondary | ICD-10-CM

## 2015-03-09 DIAGNOSIS — O411220 Chorioamnionitis, second trimester, not applicable or unspecified: Secondary | ICD-10-CM | POA: Diagnosis present

## 2015-03-09 DIAGNOSIS — O411221 Chorioamnionitis, second trimester, fetus 1: Secondary | ICD-10-CM | POA: Diagnosis present

## 2015-03-09 DIAGNOSIS — O42112 Preterm premature rupture of membranes, onset of labor more than 24 hours following rupture, second trimester: Secondary | ICD-10-CM

## 2015-03-09 DIAGNOSIS — N760 Acute vaginitis: Secondary | ICD-10-CM | POA: Diagnosis present

## 2015-03-09 DIAGNOSIS — Z880 Allergy status to penicillin: Secondary | ICD-10-CM

## 2015-03-09 DIAGNOSIS — O4290 Premature rupture of membranes, unspecified as to length of time between rupture and onset of labor, unspecified weeks of gestation: Secondary | ICD-10-CM | POA: Diagnosis present

## 2015-03-09 DIAGNOSIS — O42912 Preterm premature rupture of membranes, unspecified as to length of time between rupture and onset of labor, second trimester: Principal | ICD-10-CM | POA: Diagnosis present

## 2015-03-09 DIAGNOSIS — D649 Anemia, unspecified: Secondary | ICD-10-CM | POA: Diagnosis present

## 2015-03-09 DIAGNOSIS — O30009 Twin pregnancy, unspecified number of placenta and unspecified number of amniotic sacs, unspecified trimester: Secondary | ICD-10-CM | POA: Diagnosis present

## 2015-03-09 DIAGNOSIS — O9081 Anemia of the puerperium: Secondary | ICD-10-CM | POA: Diagnosis present

## 2015-03-09 LAB — COMPREHENSIVE METABOLIC PANEL
ALT: 18 U/L (ref 14–54)
ANION GAP: 10 (ref 5–15)
AST: 20 U/L (ref 15–41)
Albumin: 2.6 g/dL — ABNORMAL LOW (ref 3.5–5.0)
Alkaline Phosphatase: 71 U/L (ref 38–126)
BUN: 5 mg/dL — ABNORMAL LOW (ref 6–20)
CHLORIDE: 101 mmol/L (ref 101–111)
CO2: 20 mmol/L — ABNORMAL LOW (ref 22–32)
Calcium: 8.6 mg/dL — ABNORMAL LOW (ref 8.9–10.3)
Creatinine, Ser: 0.58 mg/dL (ref 0.44–1.00)
Glucose, Bld: 97 mg/dL (ref 65–99)
POTASSIUM: 3.5 mmol/L (ref 3.5–5.1)
Sodium: 131 mmol/L — ABNORMAL LOW (ref 135–145)
Total Bilirubin: 0.5 mg/dL (ref 0.3–1.2)
Total Protein: 6.5 g/dL (ref 6.5–8.1)

## 2015-03-09 LAB — CBC
HEMATOCRIT: 24.9 % — AB (ref 36.0–46.0)
Hemoglobin: 8.2 g/dL — ABNORMAL LOW (ref 12.0–15.0)
MCH: 25.6 pg — AB (ref 26.0–34.0)
MCHC: 32.9 g/dL (ref 30.0–36.0)
MCV: 77.8 fL — AB (ref 78.0–100.0)
Platelets: 235 10*3/uL (ref 150–400)
RBC: 3.2 MIL/uL — AB (ref 3.87–5.11)
RDW: 19.8 % — ABNORMAL HIGH (ref 11.5–15.5)
WBC: 18.5 10*3/uL — AB (ref 4.0–10.5)

## 2015-03-09 LAB — URINALYSIS, ROUTINE W REFLEX MICROSCOPIC
Bilirubin Urine: NEGATIVE
GLUCOSE, UA: NEGATIVE mg/dL
Ketones, ur: NEGATIVE mg/dL
Nitrite: NEGATIVE
PH: 6 (ref 5.0–8.0)
Protein, ur: NEGATIVE mg/dL

## 2015-03-09 LAB — URINE MICROSCOPIC-ADD ON

## 2015-03-09 LAB — WET PREP, GENITAL
SPERM: NONE SEEN
Trich, Wet Prep: NONE SEEN
YEAST WET PREP: NONE SEEN

## 2015-03-09 MED ORDER — MISOPROSTOL 200 MCG PO TABS
200.0000 ug | ORAL_TABLET | ORAL | Status: DC
  Administered 2015-03-10: 200 ug via VAGINAL
  Filled 2015-03-09: qty 1

## 2015-03-09 MED ORDER — GENTAMICIN SULFATE 40 MG/ML IJ SOLN
150.0000 mg | Freq: Three times a day (TID) | INTRAVENOUS | Status: DC
  Administered 2015-03-10 – 2015-03-11 (×5): 150 mg via INTRAVENOUS
  Filled 2015-03-09 (×7): qty 3.75

## 2015-03-09 MED ORDER — VANCOMYCIN HCL IN DEXTROSE 1-5 GM/200ML-% IV SOLN
1000.0000 mg | Freq: Two times a day (BID) | INTRAVENOUS | Status: DC
  Administered 2015-03-10 (×3): 1000 mg via INTRAVENOUS
  Filled 2015-03-09 (×4): qty 200

## 2015-03-09 NOTE — MAU Provider Note (Signed)
History     CSN: [id number]  Arrival date and time: 03/09/15 2112   First Provider Initiated Contact with Patient 03/09/15 2135      Chief Complaint  Patient presents with  . Fever   HPI Comments: April Rodgers is a 30 y.o. G3P1011 at [redacted]w[redacted]d who presents today via EMS with fever, chills and body aches. She has mono/di twins with PPROM on 03/03/15. She was sent home for expectant management. She states that this morning she had a temp of 100.8, and she took tylenol. She states that she had another temp again around lunchtime. She would not tell me what that temp was, but she took tylenol. She reports chills and body aches. No sick contacts. Reports stuffy nose, but denies cough, sore throat.   Fever  This is a new problem. The current episode started today (at 0730 this morning). The problem occurs intermittently. The problem has been waxing and waning. The maximum temperature noted was 100 to 100.9 F. The temperature was taken using an oral thermometer. Associated symptoms include abdominal pain. Pertinent negatives include no coughing, headaches, nausea, urinary pain or vomiting. She has tried acetaminophen for the symptoms. The treatment provided moderate relief.    Past Medical History  Diagnosis Date  . Heart murmur   . Anemia   . Mitral valve prolapse   . Headache     Past Surgical History  Procedure Laterality Date  . Tonsillectomy    . Wisdom tooth extraction      History reviewed. No pertinent family history.  Social History  Substance Use Topics  . Smoking status: Never Smoker   . Smokeless tobacco: None  . Alcohol Use: No    Allergies:  Allergies  Allergen Reactions  . Penicillins Hives and Rash    Has patient had a PCN reaction causing immediate rash, facial/tongue/throat swelling, SOB or lightheadedness with hypotension: Yes Has patient had a PCN reaction causing severe rash involving mucus membranes or skin necrosis: No Has patient had a PCN reaction  that required hospitalization No Has patient had a PCN reaction occurring within the last 10 years: No If all of the above answers are "NO", then may proceed with Cephalosporin use.     Prescriptions prior to admission  Medication Sig Dispense Refill Last Dose  . acetaminophen (TYLENOL) 500 MG tablet Take 1,000 mg by mouth every 6 (six) hours as needed for mild pain or headache.   03/09/2015 at Unknown time  . iron polysaccharides (NIFEREX) 150 MG capsule Take 1 capsule (150 mg total) by mouth daily. With food. 30 capsule 3 Past Week at Unknown time  . ondansetron (ZOFRAN-ODT) 4 MG disintegrating tablet Take 1 tablet by mouth every 8 (eight) hours as needed.  1 Past Week at Unknown time  . Prenatal Vit-Fe Fumarate-FA (PRENATAL MULTIVITAMIN) TABS tablet Take 1 tablet by mouth daily at 12 noon.   Past Week at Unknown time  . promethazine (PHENERGAN) 25 MG tablet Take 0.5-1 tablets by mouth every 6 (six) hours as needed.  3 Past Week at Unknown time  . vitamin B-6 (PYRIDOXINE) 25 MG tablet Take 1 tablet by mouth 3 (three) times daily.  0 Past Week at Unknown time  . WAL-SOM 25 MG tablet Take 1 tablet by mouth daily.  0 Past Week at Unknown time    Review of Systems  Constitutional: Positive for fever and chills.  Respiratory: Negative for cough.   Gastrointestinal: Positive for abdominal pain. Negative for nausea and vomiting.  Genitourinary: Negative for dysuria, urgency and frequency.  Neurological: Negative for headaches.   Physical Exam   Blood pressure 103/57, pulse 133, temperature 99.1 F (37.3 C), temperature source Oral, resp. rate 18, last menstrual period 10/29/2014, SpO2 99 %, unknown if currently breastfeeding.  Physical Exam  Nursing note and vitals reviewed. Constitutional: She is oriented to person, place, and time. She appears well-developed and well-nourished. No distress.  HENT:  Head: Normocephalic.  Cardiovascular: Normal rate and regular rhythm.   Respiratory:  Effort normal and breath sounds normal.  GI: Soft. There is tenderness.  Neurological: She is alert and oriented to person, place, and time.  Skin: Skin is warm and dry.  Psychiatric: She has a normal mood and affect.   Results for orders placed or performed during the hospital encounter of 03/09/15 (from the past 24 hour(s))  CBC     Status: Abnormal   Collection Time: 03/09/15  9:50 PM  Result Value Ref Range   WBC 18.5 (H) 4.0 - 10.5 K/uL   RBC 3.20 (L) 3.87 - 5.11 MIL/uL   Hemoglobin 8.2 (L) 12.0 - 15.0 g/dL   HCT 82.9 (L) 56.2 - 13.0 %   MCV 77.8 (L) 78.0 - 100.0 fL   MCH 25.6 (L) 26.0 - 34.0 pg   MCHC 32.9 30.0 - 36.0 g/dL   RDW 86.5 (H) 78.4 - 69.6 %   Platelets 235 150 - 400 K/uL    MAU Course  Procedures  MDM Patient states that even if her uterus is infected she does not want to be delivered at this time. She would like to continue to wait.  2155: D/W Dr. Richardson Dopp. She will review the patient's chart, and call back.  2216: Dr. Richardson Dopp called the unit. New orders given. She will come see the patient.  2252: Dr. Richardson Dopp on the unit   Assessment and Plan  PPROM Probable chorio Admit for OBS UC pending Blood Culture x2 pending  Tawnya Crook 03/09/2015, 9:45 PM

## 2015-03-09 NOTE — MAU Note (Signed)
Urine sent to lab 

## 2015-03-09 NOTE — Progress Notes (Addendum)
ANTIBIOTIC CONSULT NOTE - INITIAL  Pharmacy Consult for Gentamicin Indication: Chorioamnionitis   Allergies  Allergen Reactions  . Penicillins Hives and Rash    Has patient had a PCN reaction causing immediate rash, facial/tongue/throat swelling, SOB or lightheadedness with hypotension: Yes Has patient had a PCN reaction causing severe rash involving mucus membranes or skin necrosis: No Has patient had a PCN reaction that required hospitalization No Has patient had a PCN reaction occurring within the last 10 years: No If all of the above answers are "NO", then may proceed with Cephalosporin use.     Patient Measurements:   Adjusted Body Weight: n/a  Vital Signs: Temp: 99.3 F (37.4 C) (02/01 2243) Temp Source: Oral (02/01 2243) BP: 119/62 mmHg (02/01 2243) Pulse Rate: 132 (02/01 2243)  Labs:  Recent Labs  03/09/15 2150  WBC 18.5*  HGB 8.2*  PLT 235  CREATININE 0.58   No results for input(s): GENTTROUGH, GENTPEAK, GENTRANDOM in the last 72 hours.   Microbiology: Recent Results (from the past 720 hour(s))  Wet prep, genital     Status: Abnormal   Collection Time: 03/09/15 11:13 PM  Result Value Ref Range Status   Yeast Wet Prep HPF POC NONE SEEN NONE SEEN Final   Trich, Wet Prep NONE SEEN NONE SEEN Final   Clue Cells Wet Prep HPF POC PRESENT (A) NONE SEEN Final   WBC, Wet Prep HPF POC MANY (A) NONE SEEN Final    Comment: MANY BACTERIA SEEN   Sperm NONE SEEN  Final    Medications:  Vancomycin 1 gram Q12 hours  Assessment: 30 y.o. female G3P1011 at [redacted]w[redacted]d with chorioamnionitis with purulent drainage from uterus. Estimated Ke = 0.44 hr-1, Vd = 0.33  Goal of Therapy:  Gentamicin peak 6-8 mg/L and Trough < 1 mg/L  Plan:    Gentamicin 150 mg IV every 8 hrs  Check Scr with next labs if gentamicin continued. Will check gentamicin levels if continued > 72hr or clinically indicated.  Loyola Mast 03/09/2015,11:43 PM

## 2015-03-09 NOTE — H&P (Signed)
April Rodgers is a 30 y.o. female G3P1011 at 18 wks and 5 days MC/DA pregnancy  with PPROM of twin B on 03/03/2015 presents to MAU via EMS complaining of Temperature earlier today of 100.8 at 7 am this morning. .. She took tylenol 1 gram at 5 pm for temp greater than 100.4. She reports leakage of fluid with a foul odor. She denies abdominal tenderness. She denies vaginal bleeding. She reports fetal movement. Her bp here is 99.3 howeer it has been less than 8 hours since she took the gram of tylenol. She has a WBC of 18. Purulent drainage is noted on sterile speculum exam.  She receives prenatal care with Dr. Geryl Rodgers at [facility].      History OB History    Gravida Para Term Preterm AB TAB SAB Ectopic Multiple Living   3 1 1  0 1 1 0 0 0 1     Past Medical History  Diagnosis Date  . Heart murmur   . Anemia   . Mitral valve prolapse   . Headache    Past Surgical History  Procedure Laterality Date  . Tonsillectomy    . Wisdom tooth extraction     Family History: family history is not on file. Social History:  reports that she has never smoked. She does not have any smokeless tobacco history on file. She reports that she does not drink alcohol or use illicit drugs.     Review of Systems  Constitutional: Positive for fever, chills and malaise/fatigue. Negative for weight loss and diaphoresis.  HENT: Positive for congestion.   Eyes: Negative.   Respiratory: Negative.   Cardiovascular: Negative.   Gastrointestinal: Negative.   Genitourinary:       Vaginal discharge with odor   Musculoskeletal: Negative.   Skin: Negative.   Neurological: Negative.  Negative for weakness.  Endo/Heme/Allergies: Negative.   Psychiatric/Behavioral: Negative.       Blood pressure 119/62, pulse 132, temperature 99.3 F (37.4 C), temperature source Oral, resp. rate 20, last menstrual period 10/29/2014, SpO2 100 %, unknown if currently breastfeeding. Maternal Exam:  Abdomen: Patient  reports no abdominal tenderness. Introitus: Normal vulva. Vagina is positive for vaginal discharge.  Vagina is negative for condylomata and ulcerations.    Physical Exam  Constitutional: She is oriented to person, place, and time. She appears well-developed and well-nourished.  HENT:  Head: Normocephalic and atraumatic.  Eyes: Conjunctivae are normal. Pupils are equal, round, and reactive to light.  Neck: Normal range of motion. Neck supple.  Cardiovascular: Regular rhythm.   Tachycardia   Respiratory: Effort normal and breath sounds normal.  GI: Soft. There is no tenderness. There is no rebound and no guarding.  Genitourinary: Vaginal discharge found.  Cervix appears closed.. Copious amount of purulent drainage from the vagina   Musculoskeletal: Normal range of motion. She exhibits no edema.  Neurological: She is alert and oriented to person, place, and time.  Skin: Skin is warm and dry.  Psychiatric: She has a normal mood and affect.    Prenatal labs: ABO, Rh: --/--/O POS (01/26 2003) Antibody: NEG (01/26 2003) Rubella:   RPR: Non Reactive (10/28 1151)  HBsAg:    HIV: Non Reactive (10/28 1151)  GBS:     Results for orders placed or performed during the hospital encounter of 03/09/15 (from the past 24 hour(s))  Urinalysis, Routine w reflex microscopic (not at [facility])     Status: Abnormal   Collection Time: 03/09/15  9:15 PM  Result  Value Ref Range   Color, Urine YELLOW YELLOW   APPearance CLEAR CLEAR   Specific Gravity, Urine <1.005 (L) 1.005 - 1.030   pH 6.0 5.0 - 8.0   Glucose, UA NEGATIVE NEGATIVE mg/dL   Hgb urine dipstick LARGE (A) NEGATIVE   Bilirubin Urine NEGATIVE NEGATIVE   Ketones, ur NEGATIVE NEGATIVE mg/dL   Protein, ur NEGATIVE NEGATIVE mg/dL   Nitrite NEGATIVE NEGATIVE   Leukocytes, UA LARGE (A) NEGATIVE  Urine microscopic-add on     Status: Abnormal   Collection Time: 03/09/15  9:15 PM  Result Value Ref Range   Squamous Epithelial / LPF 0-5 (A) NONE SEEN    WBC, UA 6-30 0 - 5 WBC/hpf   RBC / HPF 6-30 0 - 5 RBC/hpf   Bacteria, UA FEW (A) NONE SEEN  CBC     Status: Abnormal   Collection Time: 03/09/15  9:50 PM  Result Value Ref Range   WBC 18.5 (H) 4.0 - 10.5 K/uL   RBC 3.20 (L) 3.87 - 5.11 MIL/uL   Hemoglobin 8.2 (L) 12.0 - 15.0 g/dL   HCT 16.1 (L) 09.6 - 04.5 %   MCV 77.8 (L) 78.0 - 100.0 fL   MCH 25.6 (L) 26.0 - 34.0 pg   MCHC 32.9 30.0 - 36.0 g/dL   RDW 40.9 (H) 81.1 - 91.4 %   Platelets 235 150 - 400 K/uL  Comprehensive metabolic panel     Status: Abnormal   Collection Time: 03/09/15  9:50 PM  Result Value Ref Range   Sodium 131 (L) 135 - 145 mmol/L   Potassium 3.5 3.5 - 5.1 mmol/L   Chloride 101 101 - 111 mmol/L   CO2 20 (L) 22 - 32 mmol/L   Glucose, Bld 97 65 - 99 mg/dL   BUN 5 (L) 6 - 20 mg/dL   Creatinine, Ser 7.82 0.44 - 1.00 mg/dL   Calcium 8.6 (L) 8.9 - 10.3 mg/dL   Total Protein 6.5 6.5 - 8.1 g/dL   Albumin 2.6 (L) 3.5 - 5.0 g/dL   AST 20 15 - 41 U/L   ALT 18 14 - 54 U/L   Alkaline Phosphatase 71 38 - 126 U/L   Total Bilirubin 0.5 0.3 - 1.2 mg/dL   GFR calc non Af Amer >60 >60 mL/min   GFR calc Af Amer >60 >60 mL/min   Anion gap 10 5 - 15  Wet prep, genital     Status: Abnormal   Collection Time: 03/09/15 11:13 PM  Result Value Ref Range   Yeast Wet Prep HPF POC NONE SEEN NONE SEEN   Trich, Wet Prep NONE SEEN NONE SEEN   Clue Cells Wet Prep HPF POC PRESENT (A) NONE SEEN   WBC, Wet Prep HPF POC MANY (A) NONE SEEN   Sperm NONE SEEN     Assessment/Plan: 18 wks and 5 days MC/DA pregnancy with PPROM currently with chorioamniotis based on temp at home/ elevated WBC 18k and purulent discharge from uterus  Pt is advised that delivery is recommended  Given chorioamnionitis. She is informed that the fetus' are nonviable since she is less than 23 wks and have no chance of survival. She voiced understanding. She is in agreement with proceeding with delivery given chorioamnionits.  Plan to start Vancomycin and Gentamicin  given her allergy to penicillin.  Cytotec 200 mcg per vagina for induction.   Anticipate SVD Repeat cbc in am  Dr. Dion Rodgers to assume care at 7 am 03/10/2015.     April Rodgers  J. 03/09/2015, 11:30 PM

## 2015-03-09 NOTE — MAU Note (Signed)
Pt reports fever that started today. Was 100.8 at home-took 2 extra strength tylenol around 5pm and temp was 99. Denies pain but having some pressure in bottom with a small amount of brown discharge when wiping. Twin B SROM on Thursday.

## 2015-03-10 ENCOUNTER — Encounter (HOSPITAL_COMMUNITY): Payer: Self-pay | Admitting: Anesthesiology

## 2015-03-10 ENCOUNTER — Encounter (HOSPITAL_COMMUNITY): Payer: Self-pay

## 2015-03-10 DIAGNOSIS — Z880 Allergy status to penicillin: Secondary | ICD-10-CM | POA: Diagnosis not present

## 2015-03-10 DIAGNOSIS — O30009 Twin pregnancy, unspecified number of placenta and unspecified number of amniotic sacs, unspecified trimester: Secondary | ICD-10-CM | POA: Diagnosis present

## 2015-03-10 DIAGNOSIS — O411220 Chorioamnionitis, second trimester, not applicable or unspecified: Secondary | ICD-10-CM | POA: Diagnosis present

## 2015-03-10 DIAGNOSIS — O42912 Preterm premature rupture of membranes, unspecified as to length of time between rupture and onset of labor, second trimester: Secondary | ICD-10-CM | POA: Diagnosis present

## 2015-03-10 DIAGNOSIS — D649 Anemia, unspecified: Secondary | ICD-10-CM | POA: Diagnosis present

## 2015-03-10 DIAGNOSIS — O9081 Anemia of the puerperium: Secondary | ICD-10-CM | POA: Diagnosis present

## 2015-03-10 DIAGNOSIS — N760 Acute vaginitis: Secondary | ICD-10-CM | POA: Diagnosis present

## 2015-03-10 DIAGNOSIS — O411222 Chorioamnionitis, second trimester, fetus 2: Secondary | ICD-10-CM | POA: Diagnosis present

## 2015-03-10 DIAGNOSIS — O30002 Twin pregnancy, unspecified number of placenta and unspecified number of amniotic sacs, second trimester: Secondary | ICD-10-CM | POA: Diagnosis present

## 2015-03-10 DIAGNOSIS — O411221 Chorioamnionitis, second trimester, fetus 1: Secondary | ICD-10-CM | POA: Diagnosis present

## 2015-03-10 DIAGNOSIS — Z3A18 18 weeks gestation of pregnancy: Secondary | ICD-10-CM | POA: Diagnosis not present

## 2015-03-10 LAB — CBC
HCT: 21.1 % — ABNORMAL LOW (ref 36.0–46.0)
Hemoglobin: 7.1 g/dL — ABNORMAL LOW (ref 12.0–15.0)
MCH: 26.2 pg (ref 26.0–34.0)
MCHC: 33.6 g/dL (ref 30.0–36.0)
MCV: 77.9 fL — ABNORMAL LOW (ref 78.0–100.0)
PLATELETS: 171 10*3/uL (ref 150–400)
RBC: 2.71 MIL/uL — ABNORMAL LOW (ref 3.87–5.11)
RDW: 19.4 % — ABNORMAL HIGH (ref 11.5–15.5)
WBC: 23.1 10*3/uL — ABNORMAL HIGH (ref 4.0–10.5)

## 2015-03-10 LAB — PROTIME-INR
INR: 1.09 (ref 0.00–1.49)
PROTHROMBIN TIME: 14.3 s (ref 11.6–15.2)

## 2015-03-10 LAB — TYPE AND SCREEN
ABO/RH(D): O POS
Antibody Screen: NEGATIVE

## 2015-03-10 LAB — FIBRINOGEN: Fibrinogen: 647 mg/dL — ABNORMAL HIGH (ref 204–475)

## 2015-03-10 LAB — APTT: aPTT: 26 seconds (ref 24–37)

## 2015-03-10 MED ORDER — ACETAMINOPHEN 325 MG PO TABS: 650.0000 mg | ORAL_TABLET | ORAL | Status: DC | PRN

## 2015-03-10 MED ORDER — PRENATAL MULTIVITAMIN CH
1.0000 | ORAL_TABLET | Freq: Every day | ORAL | Status: DC
  Administered 2015-03-10 – 2015-03-11 (×2): 1 via ORAL
  Filled 2015-03-10 (×2): qty 1

## 2015-03-10 MED ORDER — OXYCODONE-ACETAMINOPHEN 5-325 MG PO TABS: 2.0000 | ORAL_TABLET | ORAL | Status: DC | PRN

## 2015-03-10 MED ORDER — PHENYLEPHRINE 40 MCG/ML (10ML) SYRINGE FOR IV PUSH (FOR BLOOD PRESSURE SUPPORT)
PREFILLED_SYRINGE | INTRAVENOUS | Status: AC
  Filled 2015-03-10: qty 20

## 2015-03-10 MED ORDER — OXYCODONE HCL 5 MG PO TABS
5.0000 mg | ORAL_TABLET | ORAL | Status: DC | PRN
  Administered 2015-03-10: 5 mg via ORAL
  Filled 2015-03-10: qty 1

## 2015-03-10 MED ORDER — ONDANSETRON HCL 4 MG/2ML IJ SOLN: 4.0000 mg | Freq: Four times a day (QID) | INTRAMUSCULAR | Status: DC | PRN

## 2015-03-10 MED ORDER — CITRIC ACID-SODIUM CITRATE 334-500 MG/5ML PO SOLN: 30.0000 mL | ORAL | Status: DC | PRN

## 2015-03-10 MED ORDER — FENTANYL 2.5 MCG/ML BUPIVACAINE 1/10 % EPIDURAL INFUSION (WH - ANES)
INTRAMUSCULAR | Status: AC
  Filled 2015-03-10: qty 125

## 2015-03-10 MED ORDER — METHYLERGONOVINE MALEATE 0.2 MG PO TABS: 0.2000 mg | ORAL_TABLET | ORAL | Status: DC | PRN

## 2015-03-10 MED ORDER — PHENYLEPHRINE 40 MCG/ML (10ML) SYRINGE FOR IV PUSH (FOR BLOOD PRESSURE SUPPORT): 80.0000 ug | PREFILLED_SYRINGE | INTRAVENOUS | Status: DC | PRN

## 2015-03-10 MED ORDER — OXYCODONE-ACETAMINOPHEN 5-325 MG PO TABS: 1.0000 | ORAL_TABLET | ORAL | Status: DC | PRN

## 2015-03-10 MED ORDER — DIPHENHYDRAMINE HCL 25 MG PO CAPS: 25.0000 mg | ORAL_CAPSULE | Freq: Four times a day (QID) | ORAL | Status: DC | PRN

## 2015-03-10 MED ORDER — OXYTOCIN BOLUS FROM INFUSION
500.0000 mL | INTRAVENOUS | Status: DC
  Administered 2015-03-10: 500 mL via INTRAVENOUS

## 2015-03-10 MED ORDER — OXYCODONE HCL 5 MG PO TABS: 10.0000 mg | ORAL_TABLET | ORAL | Status: DC | PRN

## 2015-03-10 MED ORDER — ONDANSETRON HCL 4 MG PO TABS: 4.0000 mg | ORAL_TABLET | ORAL | Status: DC | PRN

## 2015-03-10 MED ORDER — IBUPROFEN 600 MG PO TABS
600.0000 mg | ORAL_TABLET | Freq: Four times a day (QID) | ORAL | Status: DC
  Administered 2015-03-10 – 2015-03-11 (×6): 600 mg via ORAL
  Filled 2015-03-10 (×5): qty 1

## 2015-03-10 MED ORDER — LACTATED RINGERS IV SOLN
INTRAVENOUS | Status: DC
  Administered 2015-03-10: 125 mL/h via INTRAVENOUS
  Administered 2015-03-10: 01:00:00 via INTRAVENOUS

## 2015-03-10 MED ORDER — LOPERAMIDE HCL 1 MG/5ML PO LIQD
2.0000 mg | ORAL | Status: DC | PRN
  Filled 2015-03-10 (×2): qty 10

## 2015-03-10 MED ORDER — LOPERAMIDE HCL 2 MG PO CAPS
2.0000 mg | ORAL_CAPSULE | ORAL | Status: DC | PRN
  Administered 2015-03-10 (×2): 2 mg via ORAL
  Filled 2015-03-10 (×3): qty 1

## 2015-03-10 MED ORDER — OXYTOCIN 10 UNIT/ML IJ SOLN
2.5000 [IU]/h | INTRAVENOUS | Status: DC
  Filled 2015-03-10: qty 4

## 2015-03-10 MED ORDER — ZOLPIDEM TARTRATE 5 MG PO TABS: 5.0000 mg | ORAL_TABLET | Freq: Every evening | ORAL | Status: DC | PRN

## 2015-03-10 MED ORDER — FENTANYL CITRATE (PF) 100 MCG/2ML IJ SOLN
50.0000 ug | INTRAMUSCULAR | Status: DC | PRN
  Administered 2015-03-10 (×2): 100 ug via INTRAVENOUS
  Filled 2015-03-10 (×2): qty 2

## 2015-03-10 MED ORDER — LACTATED RINGERS IV SOLN
500.0000 mL | INTRAVENOUS | Status: DC | PRN
Start: 2015-03-10 — End: 2015-03-10

## 2015-03-10 MED ORDER — SENNOSIDES-DOCUSATE SODIUM 8.6-50 MG PO TABS
2.0000 | ORAL_TABLET | ORAL | Status: DC
  Administered 2015-03-10: 2 via ORAL
  Filled 2015-03-10: qty 2

## 2015-03-10 MED ORDER — SIMETHICONE 80 MG PO CHEW
80.0000 mg | CHEWABLE_TABLET | ORAL | Status: DC | PRN
Start: 2015-03-10 — End: 2015-03-11

## 2015-03-10 MED ORDER — METRONIDAZOLE IN NACL 5-0.79 MG/ML-% IV SOLN
500.0000 mg | Freq: Three times a day (TID) | INTRAVENOUS | Status: DC
  Administered 2015-03-10 – 2015-03-11 (×3): 500 mg via INTRAVENOUS
  Filled 2015-03-10 (×4): qty 100

## 2015-03-10 MED ORDER — FENTANYL 2.5 MCG/ML BUPIVACAINE 1/10 % EPIDURAL INFUSION (WH - ANES): 14.0000 mL/h | INTRAMUSCULAR | Status: DC | PRN

## 2015-03-10 MED ORDER — METHYLERGONOVINE MALEATE 0.2 MG/ML IJ SOLN: 0.2000 mg | INTRAMUSCULAR | Status: DC | PRN

## 2015-03-10 MED ORDER — CARBOPROST TROMETHAMINE 250 MCG/ML IM SOLN
250.0000 ug | Freq: Once | INTRAMUSCULAR | Status: AC
  Administered 2015-03-10: 250 ug via INTRAMUSCULAR

## 2015-03-10 MED ORDER — HYDROXYZINE HCL 50 MG PO TABS: 50.0000 mg | ORAL_TABLET | Freq: Four times a day (QID) | ORAL | Status: DC | PRN

## 2015-03-10 MED ORDER — DIPHENHYDRAMINE HCL 50 MG/ML IJ SOLN: 12.5000 mg | INTRAMUSCULAR | Status: DC | PRN

## 2015-03-10 MED ORDER — FERROUS SULFATE 325 (65 FE) MG PO TABS
325.0000 mg | ORAL_TABLET | Freq: Two times a day (BID) | ORAL | Status: DC
  Administered 2015-03-10 – 2015-03-11 (×3): 325 mg via ORAL
  Filled 2015-03-10 (×4): qty 1

## 2015-03-10 MED ORDER — BENZOCAINE-MENTHOL 20-0.5 % EX AERO
1.0000 "application " | INHALATION_SPRAY | CUTANEOUS | Status: DC | PRN
  Administered 2015-03-10: 1 via TOPICAL
  Filled 2015-03-10 (×2): qty 56

## 2015-03-10 MED ORDER — LIDOCAINE HCL (PF) 1 % IJ SOLN: 30.0000 mL | INTRAMUSCULAR | Status: DC | PRN

## 2015-03-10 MED ORDER — LACTATED RINGERS IV SOLN
INTRAVENOUS | Status: DC
  Administered 2015-03-10 (×2): via INTRAVENOUS

## 2015-03-10 MED ORDER — METRONIDAZOLE IN NACL 5-0.79 MG/ML-% IV SOLN: 500.0000 mg | Freq: Two times a day (BID) | INTRAVENOUS | Status: DC

## 2015-03-10 MED ORDER — ONDANSETRON HCL 4 MG/2ML IJ SOLN: 4.0000 mg | INTRAMUSCULAR | Status: DC | PRN

## 2015-03-10 MED ORDER — EPHEDRINE 5 MG/ML INJ: 10.0000 mg | INTRAVENOUS | Status: DC | PRN

## 2015-03-10 NOTE — Progress Notes (Signed)
Initial visit with patient and her husband upon referral from [employer].  April Rodgers delivered her 43 week twin daughters, April Rodgers and April Rodgers this morning.  The couple also have an 29 year old daughter who was with them earlier and is currently at school.  I introduced spiritual care services and shared with the family that I am happy to help them make whatever memories they would like during their time here at [facility].  I shared funeral home information as the family is currently deciding whether to proceed with a funeral home or allow the hospital to take care of the cremains.  I offered the family support and resources for talking with their older daughter about the death of her sisters.    Family was appreciative of support and didn't want to talk at this time. They are aware of how to request a chaplain and I will continue to follow.  Please page as further needs arise.  Maryanna Shape. Carley Hammed, M.Div. [facility] Chaplain Pager [phone] Office [phone]

## 2015-03-10 NOTE — Progress Notes (Signed)
Follow up visit to provide continued care for April Rodgers. April Weiand reports she is still numb from the fetal death of her twins. She wishes to have their remains cremated by a funeral service so that their ashes will be returned to her. She is trying to be brave, but grief is overtaking her resolve. She is unsure how much support her SO, the birth father, will provide. This should be a topic of conversation when weekday chaplains speak with her on 11 Mar 2015.  Prayer and grief care provide at April Jurney's request.  Benjie Karvonen. Malayia Spizzirri, DMin. MDiv, MA Chaplain

## 2015-03-10 NOTE — Progress Notes (Signed)
HARMONEY SIENKIEWICZ MRN: [id number]  Subjective: -Nurse calls provider to bedside expressing concern regarding vaginal exam.  States patient very hot internally with purulent discharge. Also reports labs as below.  Nurse informed that MD is aware of patient status including discharge as MD updated this provider upon patient arrival.  Patient reports continuation of cramping and vaginal pressure, but states similar to when she arrived at hospital.    Objective: BP 109/53 mmHg  Pulse 121  Temp(Src) 99.3 F (37.4 C) (Oral)  Resp 20  Ht  (1.753 m)  Wt 65.772 kg (145 lb)  BMI 21.40 kg/m2  SpO2 100%  LMP 10/29/2014      Results for orders placed or performed during the hospital encounter of 03/09/15 (from the past 24 hour(s))  Urinalysis, Routine w reflex microscopic (not at [facility])     Status: Abnormal   Collection Time: 03/09/15  9:15 PM  Result Value Ref Range   Color, Urine YELLOW YELLOW   APPearance CLEAR CLEAR   Specific Gravity, Urine <1.005 (L) 1.005 - 1.030   pH 6.0 5.0 - 8.0   Glucose, UA NEGATIVE NEGATIVE mg/dL   Hgb urine dipstick LARGE (A) NEGATIVE   Bilirubin Urine NEGATIVE NEGATIVE   Ketones, ur NEGATIVE NEGATIVE mg/dL   Protein, ur NEGATIVE NEGATIVE mg/dL   Nitrite NEGATIVE NEGATIVE   Leukocytes, UA LARGE (A) NEGATIVE  Urine microscopic-add on     Status: Abnormal   Collection Time: 03/09/15  9:15 PM  Result Value Ref Range   Squamous Epithelial / LPF 0-5 (A) NONE SEEN   WBC, UA 6-30 0 - 5 WBC/hpf   RBC / HPF 6-30 0 - 5 RBC/hpf   Bacteria, UA FEW (A) NONE SEEN  CBC     Status: Abnormal   Collection Time: 03/09/15  9:50 PM  Result Value Ref Range   WBC 18.5 (H) 4.0 - 10.5 K/uL   RBC 3.20 (L) 3.87 - 5.11 MIL/uL   Hemoglobin 8.2 (L) 12.0 - 15.0 g/dL   HCT 11.9 (L) 14.7 - 82.9 %   MCV 77.8 (L) 78.0 - 100.0 fL   MCH 25.6 (L) 26.0 - 34.0 pg   MCHC 32.9 30.0 - 36.0 g/dL   RDW 56.2 (H) 13.0 - 86.5 %   Platelets 235 150 - 400 K/uL  Comprehensive metabolic panel      Status: Abnormal   Collection Time: 03/09/15  9:50 PM  Result Value Ref Range   Sodium 131 (L) 135 - 145 mmol/L   Potassium 3.5 3.5 - 5.1 mmol/L   Chloride 101 101 - 111 mmol/L   CO2 20 (L) 22 - 32 mmol/L   Glucose, Bld 97 65 - 99 mg/dL   BUN 5 (L) 6 - 20 mg/dL   Creatinine, Ser 7.84 0.44 - 1.00 mg/dL   Calcium 8.6 (L) 8.9 - 10.3 mg/dL   Total Protein 6.5 6.5 - 8.1 g/dL   Albumin 2.6 (L) 3.5 - 5.0 g/dL   AST 20 15 - 41 U/L   ALT 18 14 - 54 U/L   Alkaline Phosphatase 71 38 - 126 U/L   Total Bilirubin 0.5 0.3 - 1.2 mg/dL   GFR calc non Af Amer >60 >60 mL/min   GFR calc Af Amer >60 >60 mL/min   Anion gap 10 5 - 15  Type and screen [facility]     Status: None   Collection Time: 03/09/15 10:50 PM  Result Value Ref Range   ABO/RH(D) O POS  Antibody Screen NEG    Sample Expiration 03/12/2015   Wet prep, genital     Status: Abnormal   Collection Time: 03/09/15 11:13 PM  Result Value Ref Range   Yeast Wet Prep HPF POC NONE SEEN NONE SEEN   Trich, Wet Prep NONE SEEN NONE SEEN   Clue Cells Wet Prep HPF POC PRESENT (A) NONE SEEN   WBC, Wet Prep HPF POC MANY (A) NONE SEEN   Sperm NONE SEEN      Vaginal Exam: SVE:    2-3 per RN Exam Membranes:SROM Internal Monitors: None  Augmentation/Induction: Pitocin:None Cytotec: 1st Dose placed  Assessment:  Twin Gestation at 18.5wks PPROM Chorioamnionitis   Plan: -Continue mgmt as ordered by attending MD  Valma Cava, CNM 03/10/2015, 1:16 AM

## 2015-03-10 NOTE — Progress Notes (Addendum)
Postpartum day #0,  Previable twin gestation Chorioamnionitis   Subjective Pt without complaints.  Lochia minimal.  Pain controlled.   Not dizzy with ambulating.  Temp:  [97.6 F (36.4 C)-99.3 F (37.4 C)] 97.6 F (36.4 C) (02/02 1219) Pulse Rate:  [76-139] 76 (02/02 1219) Resp:  [16-20] 18 (02/02 1219) BP: (81-124)/(36-70) 93/56 mmHg (02/02 1219) SpO2:  [97 %-100 %] 97 % (02/02 1219) Weight:  [65.772 kg (145 lb)] 65.772 kg (145 lb) (02/02 0110)  Gen:  NAD, A&O x 3 Uterine fundus:  Firm, nontender Lochia pad minimally soiled Ext:  No Edema, no calf tenderness bilaterally  CBC    Component Value Date/Time   WBC 23.1* 03/10/2015 1320   RBC 2.71* 03/10/2015 1320   HGB 7.1* 03/10/2015 1320   HCT 21.1* 03/10/2015 1320   PLT 171 03/10/2015 1320   MCV 77.9* 03/10/2015 1320   MCH 26.2 03/10/2015 1320   MCHC 33.6 03/10/2015 1320   RDW 19.4* 03/10/2015 1320   LYMPHSABS 1.5 12/03/2014 1151   MONOABS 0.4 12/03/2014 1151   EOSABS 0.0 12/03/2014 1151   BASOSABS 0.0 12/03/2014 1151   Wet mount-Clue cells present.  A/P:. S/p SVD of Twin gestation at 18 weeks with known PROM  Chorioamnionitis, near sepsis.  Afebrile since admission. Anemia-asymptomatic.  Hg was 8.0 less than 1 week ago.  -On Vancomycin and Gentamycin since admission.  Added Flagyl this am to improve anaerobic coverage and cover BV, discussed with pharmacist.  Continue antibiotics at least 24 hours. Consider 48 hours depending on WBC and clinical presentation. Follow up cultures. CBC with diff in am.  Strict I/Os.  Continue IVF 125 cc/1 hr.    -Continue iron therapy.  -Discussed at length with pt and FOB diagnosis, plan of care and implications for future pregnancies.  Parents had questions about appearance of Twin B and whether a band was present.  They declined autopsy at this time.  Benefits reviewed.  Appreciate chaplain consult.  Continue grief counseling prn.  Dr. Charlotta Newton covering today from 5 PM to 7 AM.  Dr.  Richardson Dopp covering 7 pm to 7 am on 03/11/15.  Pt informed.  F/u 1 week s/p discharge.  April Rodgers 03/10/2015, 1:50 PM

## 2015-03-11 LAB — CBC WITH DIFFERENTIAL/PLATELET
BASOS ABS: 0 10*3/uL (ref 0.0–0.1)
Basophils Relative: 0 %
EOS ABS: 0.1 10*3/uL (ref 0.0–0.7)
EOS PCT: 0 %
HCT: 21.3 % — ABNORMAL LOW (ref 36.0–46.0)
Hemoglobin: 7.1 g/dL — ABNORMAL LOW (ref 12.0–15.0)
Lymphocytes Relative: 11 %
Lymphs Abs: 1.5 10*3/uL (ref 0.7–4.0)
MCH: 25.9 pg — AB (ref 26.0–34.0)
MCHC: 33.3 g/dL (ref 30.0–36.0)
MCV: 77.7 fL — ABNORMAL LOW (ref 78.0–100.0)
MONO ABS: 1 10*3/uL (ref 0.1–1.0)
Monocytes Relative: 8 %
Neutro Abs: 11 10*3/uL — ABNORMAL HIGH (ref 1.7–7.7)
Neutrophils Relative %: 81 %
PLATELETS: 182 10*3/uL (ref 150–400)
RBC: 2.74 MIL/uL — AB (ref 3.87–5.11)
RDW: 19.7 % — AB (ref 11.5–15.5)
WBC: 13.5 10*3/uL — AB (ref 4.0–10.5)

## 2015-03-11 LAB — URINE CULTURE

## 2015-03-11 MED ORDER — IBUPROFEN 600 MG PO TABS: 600.0000 mg | ORAL_TABLET | Freq: Four times a day (QID) | ORAL | Status: DC | PRN

## 2015-03-11 MED ORDER — METRONIDAZOLE 500 MG PO TABS: 500.0000 mg | ORAL_TABLET | Freq: Two times a day (BID) | ORAL | Status: DC

## 2015-03-11 NOTE — Progress Notes (Signed)
I spent time with April Rodgers and her SO.  I offered prayer, at pt's request.  I also provided grief education, particularly about incongruent grief between partners and about a child's grief.  They had been given resources already for [facility] and [facility].  I also gave them my card and Chaplain Janetta Hora Lomax's card for follow up support.    Chaplain Dyanne Carrel, Bcc Pager, [phone] 11:33 AM    03/11/15 1100  Clinical Encounter Type  Visited With Patient  Visit Type Spiritual support;Follow-up  Referral From Chaplain  Spiritual Encounters  Spiritual Needs Emotional;Grief support  Stress Factors  Patient Stress Factors Loss (Perinatal loss of twins)  Family Stress Factors Loss (Perinatal loss of twins)

## 2015-03-11 NOTE — Progress Notes (Signed)
Post Partum Day 1 s/p delivery of previable twins due to chorioamnionitis  Subjective: no complaints, up ad lib, voiding, tolerating PO and + flatus pt is coping with grief appropriately. Has strong family support   Objective: Blood pressure 99/62, pulse 83, temperature 98 F (36.7 C), temperature source Oral, resp. rate 14, height  (1.753 m), weight 145 lb (65.772 kg), last menstrual period 10/29/2014, SpO2 100 %, unknown if currently breastfeeding.  Physical Exam:  General: alert and cooperative  CV : RRR  Lungs clear to auscultation bilaterally.  Abdomen soft nondistended appropriately tender no rebound no guarding + BS present  Lochia: appropriate Uterine Fundus: firm Incision: NA DVT Evaluation: No evidence of DVT seen on physical exam.   Recent Labs  03/10/15 1320 03/11/15 0520  HGB 7.1* 7.1*  HCT 21.1* 21.3*    Assessment/Plan: Discharge home  Chorioamniotis resolving WBC down to 13.5 from 23 Pt has been afebrile for over 24 hours  BV continue flagyl 500 mg bid to complete 7 days of therapy Follow up with Dr. Dion Body in 1-2 wks for PPV    LOS: 1 day   April Mattia J. 03/11/2015, 9:43 AM

## 2015-03-11 NOTE — Progress Notes (Signed)
Pt discharged to home with significant other.  Condition stable.  Pt home with information about [facility] and plans for cremation of babies through [facility].  Pt to car via wheelchair with Dolphus Jenny, NT.  No equipment for home ordered at discharge.

## 2015-03-14 LAB — CULTURE, BLOOD (ROUTINE X 2)

## 2015-03-15 LAB — CULTURE, BLOOD (ROUTINE X 2): CULTURE: NO GROWTH

## 2015-03-17 NOTE — Discharge Summary (Signed)
OB Discharge Summary     Patient Name: April Rodgers DOB: [birth year] MRN: [id number]  Date of admission: 03/09/2015 Delivering MD:    Adalene, Gulotta [[id number]]      Abeer, Deskins FD [[id number]]  [facility], JESSICA   Renate, Danh FD [[id number]]  Gerald Leitz   Date of discharge: 03/17/2015  Admitting diagnosis: 18w flu like symptoms Intrauterine pregnancy: [redacted]w[redacted]d     Secondary diagnosis:  Principal Problem:   Chorioamnionitis in second trimester Active Problems:   PROM (premature rupture of membranes)   Twin pregnancy, antepartum   Vaginal delivery  Additional problems:     Discharge diagnosis: Preterm Pregnancy Delivered and chorioamnionitis, near sepsis                                                                                                Post partum procedures:Vanc/Gent/Flagyl  Augmentation: Cytotec  Complications: Intrauterine Inflammation or infection (Chorioamniotis)  Hospital course:  Induction of Labor With Vaginal Delivery   30 y.o. yo G3P1011 at [redacted]w[redacted]d was admitted to the hospital 03/09/2015 for induction of labor.  Indication for induction: chorioamnionitis.  Patient had an uncomplicated labor course as follows: Membrane Rupture Time/Date:    Jacari, Kirsten [[id number]]  4:30 PM   Baya, Lentz FD [[id number]]  8:00 AM   Eppie Gibson FD [[id number]]  2:16 AM ,   Aleya, Durnell [[id number]]  03/03/2015   Shasha, Buchbinder FD [[id number]]  03/03/2015   Grettel, Rames FD [[id number]]  03/10/2015   Intrapartum Procedures: Episiotomy:    Jessabelle, Markiewicz [[id number]]      Ryland, Smoots FD [[id number]]  None [1]   Tyiesha, Brackney FD [[id number]]  None [1]                                         Lacerations:     Avey, Mcmanamon [[id number]]      Mayrani, Khamis FD [[id number]]  None [1]   Heyli, Min FD [[id number]]  None [1]  Patient had delivery of a Non Viable  infant.  Information for the patient's newborn:  Sande, Pickert [[id number]]    Information for the patient's newborn:  Geralyn, Figiel [[id number]]    Information for the patient's newborn:  Skyelar, Halliday [[id number]]  Delivery Method: Vaginal, Spontaneous Delivery (Filed from Delivery Summary)     Ismerai, Bin [[id number]]    Tyeesha, Riker FD [[id number]]  03/10/2015   Kareem, Aul FD [[id number]]  03/10/2015  Details of delivery can be found in separate delivery note.  Patient had a routine postpartum course. Patient is discharged home 03/17/2015.   Physical exam  Filed Vitals:   03/10/15 1804 03/10/15 2057 03/11/15 0521 03/11/15 1200  BP: 104/64 88/53 99/62  106/59  Pulse: 90 94 83 98  Temp: 97.8 F (36.6 C) 98 F (36.7 C) 98 F (36.7 C) 97.7 F (36.5 C)  TempSrc: Oral Oral Oral Oral  Resp: Height:      Weight:      SpO2: 97% 100%  100% 99%   General: alert, cooperative and no distress Lochia: appropriate Uterine Fundus: firm Incision: N/A DVT Evaluation: No evidence of DVT seen on physical exam. Labs: Lab Results  Component Value Date   WBC 13.5* 03/11/2015   HGB 7.1* 03/11/2015   HCT 21.3* 03/11/2015   MCV 77.7* 03/11/2015   PLT 182 03/11/2015   CMP Latest Ref Rng 03/09/2015  Glucose 65 - 99 mg/dL 97  BUN 6 - 20 mg/dL 5(L)  Creatinine 1.61 - 1.00 mg/dL 0.96  Sodium 045 - 409 mmol/L 131(L)  Potassium 3.5 - 5.1 mmol/L 3.5  Chloride 101 - 111 mmol/L 101  CO2 22 - 32 mmol/L 20(L)  Calcium 8.9 - 10.3 mg/dL 8.1(X)  Total Protein 6.5 - 8.1 g/dL 6.5  Total Bilirubin 0.3 - 1.2 mg/dL 0.5  Alkaline Phos 38 - 126 U/L 71  AST 15 - 41 U/L 20  ALT 14 - 54 U/L 18    Discharge instruction: per After Visit Summary and "Baby and Me Booklet".  After visit meds:    Medication List    STOP taking these medications        acetaminophen 500 MG tablet  Commonly known as:  TYLENOL     prenatal multivitamin Tabs tablet       TAKE these medications        ibuprofen 600 MG tablet  Commonly known as:  ADVIL,MOTRIN  Take 1 tablet (600 mg total) by mouth every 6 (six) hours as needed.     iron polysaccharides 150 MG capsule  Commonly known as:  NIFEREX  Take 1 capsule (150 mg total) by mouth daily. With food.     metroNIDAZOLE 500 MG tablet  Commonly known as:  FLAGYL  Take 1 tablet (500 mg total) by mouth 2 (two) times daily.     ondansetron 4 MG disintegrating tablet  Commonly known as:  ZOFRAN-ODT  Take 1 tablet by mouth every 8 (eight) hours as needed for nausea or vomiting.     promethazine 25 MG tablet  Commonly known as:  PHENERGAN  Take 0.5-1 tablets by mouth every 6 (six) hours as needed for nausea or vomiting.     vitamin B-6 25 MG tablet  Commonly known as:  pyridOXINE  Take 1 tablet by mouth 3 (three) times daily.     WAL-SOM 25 MG tablet  Generic drug:  doxylamine (Sleep)  Take 1 tablet by mouth daily.        Diet: routine diet  Activity: Advance as tolerated. Pelvic rest for 6 weeks.   Outpatient follow up:Within 1 week Follow up Appt:No future appointments. Follow up Visit:No Follow-up on file.  Postpartum contraception: Not Discussed  Newborn Data:   Janeva, Peaster [[id number]]  Live born unspecified sex  Birth Weight:   APGARMarland Kitchen    Jannae, Fagerstrom FD [[id number]]  Live born female  Birth Weight: 7.9 oz (225 g) APGAR: 0, 0   Coy, Vandoren FD [[id number]]  Live born female  Birth Weight: 8.1 oz (230 g) APGAR: 0, 0     03/17/2015 Geryl Rankins, MD

## 2015-03-18 ENCOUNTER — Ambulatory Visit (HOSPITAL_COMMUNITY): Payer: Managed Care, Other (non HMO)

## 2015-04-01 ENCOUNTER — Ambulatory Visit (HOSPITAL_COMMUNITY): Payer: Managed Care, Other (non HMO)

## 2015-04-11 ENCOUNTER — Other Ambulatory Visit (HOSPITAL_COMMUNITY)
Admission: RE | Admit: 2015-04-11 | Discharge: 2015-04-11 | Disposition: A | Discharge status: Home / Self Care | Payer: Managed Care, Other (non HMO) | Source: Ambulatory Visit | Attending: Obstetrics and Gynecology | Admitting: Obstetrics and Gynecology

## 2015-04-11 ENCOUNTER — Other Ambulatory Visit: Payer: Self-pay | Admitting: Obstetrics and Gynecology

## 2015-04-11 DIAGNOSIS — Z01419 Encounter for gynecological examination (general) (routine) without abnormal findings: Secondary | ICD-10-CM | POA: Insufficient documentation

## 2015-04-12 LAB — CYTOLOGY - PAP

## 2015-04-26 ENCOUNTER — Other Ambulatory Visit: Payer: Self-pay | Admitting: Obstetrics and Gynecology

## 2015-04-26 DIAGNOSIS — N856 Intrauterine synechiae: Secondary | ICD-10-CM

## 2015-05-03 ENCOUNTER — Ambulatory Visit (HOSPITAL_COMMUNITY)
Admission: RE | Admit: 2015-05-03 | Discharge: 2015-05-03 | Disposition: A | Discharge status: Home / Self Care | Payer: Managed Care, Other (non HMO) | Source: Ambulatory Visit | Attending: Obstetrics and Gynecology | Admitting: Obstetrics and Gynecology

## 2015-05-03 DIAGNOSIS — N856 Intrauterine synechiae: Secondary | ICD-10-CM | POA: Insufficient documentation

## 2015-05-03 MED ORDER — IOPAMIDOL (ISOVUE-300) INJECTION 61%
30.0000 mL | Freq: Once | INTRAVENOUS | Status: AC | PRN
  Administered 2015-05-03: 14 mL

## 2015-05-13 ENCOUNTER — Inpatient Hospital Stay (HOSPITAL_COMMUNITY)
Admission: AD | Admit: 2015-05-13 | Discharge: 2015-05-13 | Disposition: A | Discharge status: Home / Self Care | Payer: Managed Care, Other (non HMO) | Source: Ambulatory Visit | Attending: Obstetrics and Gynecology | Admitting: Obstetrics and Gynecology

## 2015-05-13 ENCOUNTER — Encounter (HOSPITAL_COMMUNITY): Payer: Self-pay | Admitting: *Deleted

## 2015-05-13 DIAGNOSIS — R102 Pelvic and perineal pain: Secondary | ICD-10-CM | POA: Insufficient documentation

## 2015-05-13 DIAGNOSIS — R1030 Lower abdominal pain, unspecified: Secondary | ICD-10-CM | POA: Insufficient documentation

## 2015-05-13 DIAGNOSIS — I341 Nonrheumatic mitral (valve) prolapse: Secondary | ICD-10-CM | POA: Insufficient documentation

## 2015-05-13 DIAGNOSIS — N971 Female infertility of tubal origin: Secondary | ICD-10-CM | POA: Insufficient documentation

## 2015-05-13 DIAGNOSIS — O9089 Other complications of the puerperium, not elsewhere classified: Secondary | ICD-10-CM | POA: Insufficient documentation

## 2015-05-13 DIAGNOSIS — Z79899 Other long term (current) drug therapy: Secondary | ICD-10-CM | POA: Diagnosis not present

## 2015-05-13 DIAGNOSIS — Z9889 Other specified postprocedural states: Secondary | ICD-10-CM | POA: Diagnosis not present

## 2015-05-13 DIAGNOSIS — Z880 Allergy status to penicillin: Secondary | ICD-10-CM | POA: Insufficient documentation

## 2015-05-13 LAB — CBC
HCT: 31.2 % — ABNORMAL LOW (ref 36.0–46.0)
HEMOGLOBIN: 10.2 g/dL — AB (ref 12.0–15.0)
MCH: 25.1 pg — AB (ref 26.0–34.0)
MCHC: 32.7 g/dL (ref 30.0–36.0)
MCV: 76.8 fL — AB (ref 78.0–100.0)
Platelets: 243 10*3/uL (ref 150–400)
RBC: 4.06 MIL/uL (ref 3.87–5.11)
RDW: 18.8 % — ABNORMAL HIGH (ref 11.5–15.5)
WBC: 3.2 10*3/uL — ABNORMAL LOW (ref 4.0–10.5)

## 2015-05-13 LAB — URINALYSIS, ROUTINE W REFLEX MICROSCOPIC
Bilirubin Urine: NEGATIVE
Glucose, UA: NEGATIVE mg/dL
HGB URINE DIPSTICK: NEGATIVE
KETONES UR: NEGATIVE mg/dL
NITRITE: NEGATIVE
PH: 6 (ref 5.0–8.0)
Protein, ur: NEGATIVE mg/dL

## 2015-05-13 LAB — URINE MICROSCOPIC-ADD ON: RBC / HPF: NONE SEEN RBC/hpf (ref 0–5)

## 2015-05-13 LAB — POCT PREGNANCY, URINE: Preg Test, Ur: NEGATIVE

## 2015-05-13 NOTE — MAU Note (Addendum)
C/o lower abdominal pain since Tuesday; pain alternates back and forth from R to L but is mainly on the R; denies any bleeding or vaginal discharge; had a procedure last week;had preterm twin delivery (vaginal) on 03/10/15; had HSG (hysterosalpigogram) 10 days ago- was told that both fallophian tubes are blocked and that she would need a D&C;

## 2015-05-13 NOTE — Discharge Instructions (Signed)

## 2015-05-13 NOTE — MAU Provider Note (Signed)
History     CSN: [id number]  Arrival date and time: 05/13/15 4098   First Provider Initiated Contact with Patient 05/13/15 [phone]       Chief Complaint  Patient presents with  . Abdominal Pain   HPI  April Rodgers is a 30 y.o. G44P1011 female who presents with abdominal pain. Per review of records pt had 18 week twin loss in February & a hysterosalpingogram last week that showed bilateral fallopian tube blockage and possible uterine fibroid. Patient was seen by Dr. Dion Body on Monday to discuss results & was referred to Dr. April Manson for management.  Reports lower abdominal pain "near the ovaries" since Tuesday. Pain comes & goes & alternates sides. Pain occurs every 2-4 hours & lasts for seconds. Describes pain as sharp & burning, rates 8/10 when it comes. Currently not in pain. Has been taking ibuprofen with mild relief.  Denies n/v/d, constipation, vaginal bleeding, vaginal discharge, dysuria, or fever/chills.     OB History    Gravida Para Term Preterm AB TAB SAB Ectopic Multiple Living   0 1 1 0 0 1 1      Past Medical History  Diagnosis Date  . Heart murmur   . Anemia   . Mitral valve prolapse   . Headache     Past Surgical History  Procedure Laterality Date  . Tonsillectomy    . Wisdom tooth extraction    . Hysto    . Hysterosalpingogram      Family History  Problem Relation Age of Onset  . Anemia Mother     Social History  Substance Use Topics  . Smoking status: Never Smoker   . Smokeless tobacco: None  . Alcohol Use: No    Allergies:  Allergies  Allergen Reactions  . Penicillins Hives and Rash    Has patient had a PCN reaction causing immediate rash, facial/tongue/throat swelling, SOB or lightheadedness with hypotension: Yes Has patient had a PCN reaction causing severe rash involving mucus membranes or skin necrosis: No Has patient had a PCN reaction that required hospitalization No Has patient had a PCN reaction occurring within the last  10 years: No If all of the above answers are "NO", then may proceed with Cephalosporin use.     Prescriptions prior to admission  Medication Sig Dispense Refill Last Dose  . doxylamine, Sleep, (UNISOM) 25 MG tablet Take 25 mg by mouth at bedtime as needed for sleep.   Past Week at Unknown time  . ferrous sulfate 325 (65 FE) MG tablet Take 325 mg by mouth daily with breakfast.   Past Week at Unknown time  . ibuprofen (ADVIL,MOTRIN) 600 MG tablet Take 1 tablet (600 mg total) by mouth every 6 (six) hours as needed. (Patient taking differently: Take 600 mg by mouth every 6 (six) hours as needed for moderate pain. ) 30 tablet 0 Past Week at Unknown time  . iron polysaccharides (NIFEREX) 150 MG capsule Take 1 capsule (150 mg total) by mouth daily. With food. (Patient not taking: Reported on 05/13/2015) 30 capsule 3 Not Taking at Unknown time  . metroNIDAZOLE (FLAGYL) 500 MG tablet Take 1 tablet (500 mg total) by mouth 2 (two) times daily. (Patient not taking: Reported on 05/13/2015) 14 tablet 0 Completed Course at Unknown time  . promethazine (PHENERGAN) 25 MG tablet Take 0.5-1 tablets by mouth every 6 (six) hours as needed for nausea or vomiting.   3 Past Week at Unknown time  . vitamin B-6 (PYRIDOXINE)  25 MG tablet Take 1 tablet by mouth 3 (three) times daily.  0 Past Week at Unknown time    Review of Systems  Constitutional: Negative.   Gastrointestinal: Positive for abdominal pain. Negative for nausea, vomiting, diarrhea and constipation.  Genitourinary: Negative.    Physical Exam   Blood pressure 119/71, pulse 73, temperature 97.7 F (36.5 C), temperature source Oral, resp. rate 16, last menstrual period 04/25/2015.  Physical Exam  Nursing note and vitals reviewed. Constitutional: She is oriented to person, place, and time. She appears well-developed and well-nourished. No distress.  HENT:  Head: Normocephalic and atraumatic.  Eyes: Conjunctivae are normal. Right eye exhibits no discharge.  Left eye exhibits no discharge. No scleral icterus.  Neck: Normal range of motion.  Cardiovascular: Normal rate, regular rhythm and normal heart sounds.   No murmur heard. Respiratory: Effort normal and breath sounds normal. No respiratory distress. She has no wheezes.  GI: Soft. Bowel sounds are normal. She exhibits no distension. There is no tenderness. There is no rebound and no guarding.  Genitourinary: Uterus is enlarged. Uterus is not deviated, not fixed and not tender. Cervix exhibits no motion tenderness. Friability: ~ 8 wk size. Right adnexum displays no mass, no tenderness and no fullness. Left adnexum displays no mass, no tenderness and no fullness.  Neurological: She is alert and oriented to person, place, and time.  Skin: Skin is warm and dry. She is not diaphoretic.  Psychiatric: She has a normal mood and affect. Her behavior is normal. Judgment and thought content normal.    MAU Course  Procedures Results for orders placed or performed during the hospital encounter of 05/13/15 (from the past 24 hour(s))  Urinalysis, Routine w reflex microscopic (not at [facility])     Status: Abnormal   Collection Time: 05/13/15  8:47 AM  Result Value Ref Range   Color, Urine YELLOW YELLOW   APPearance CLEAR CLEAR   Specific Gravity, Urine <1.005 (L) 1.005 - 1.030   pH 6.0 5.0 - 8.0   Glucose, UA NEGATIVE NEGATIVE mg/dL   Hgb urine dipstick NEGATIVE NEGATIVE   Bilirubin Urine NEGATIVE NEGATIVE   Ketones, ur NEGATIVE NEGATIVE mg/dL   Protein, ur NEGATIVE NEGATIVE mg/dL   Nitrite NEGATIVE NEGATIVE   Leukocytes, UA MODERATE (A) NEGATIVE  Urine microscopic-add on     Status: Abnormal   Collection Time: 05/13/15  8:47 AM  Result Value Ref Range   Squamous Epithelial / LPF 0-5 (A) NONE SEEN   WBC, UA 0-5 0 - 5 WBC/hpf   RBC / HPF NONE SEEN 0 - 5 RBC/hpf   Bacteria, UA RARE (A) NONE SEEN  Pregnancy, urine POC     Status: None   Collection Time: 05/13/15  8:54 AM  Result Value Ref Range   Preg  Test, Ur NEGATIVE NEGATIVE    MDM UPT negative Pt non tender on exam. Slightly enlarged uterus appreciated on bimanual exam, no adnexal masses palpated.  S/w Dr. Dion BodyVarnado. Will give patient contact information for Dr. Lyndal RainbowYalcinkaya's office as she has not heard from them yet; referral was sent on 4/4. Assessment and Plan  A:  1. Pelvic pain in female   2. Obstructed fallopian tubes     P: Discharge home Take ibuprofen prn pain per bottle instructions Contact info given for Dr. April MansonYalcinkaya; call to schedule appt  April Rodgers 05/13/2015, 8:56 AM

## 2015-06-17 ENCOUNTER — Encounter (HOSPITAL_BASED_OUTPATIENT_CLINIC_OR_DEPARTMENT_OTHER): Payer: Self-pay | Admitting: *Deleted

## 2015-06-17 NOTE — Progress Notes (Signed)
NPO AFTER MN.  ARRIVE AT 0830.  NEEDS HG AND URINE PREG.  PRE-OP ORDERS PENDING.

## 2015-06-27 NOTE — H&P (Signed)
April Rodgers is a 30 y.o. female , originally referred to me by Dr. Dion BodyVarnado, for abnormal HSG. Patient was found to have an irregularity of the endometrial lining. 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  Patient would like to preserve her childbearing potential.  Pertinent Gynecological History: Menses: Normal Bleeding: Normal Contraception: none DES exposure: denies Blood transfusions: none Sexually transmitted diseases: no past history Previous GYN Procedures: None  Last mammogram: normal Last pap: normal  OB History: G 3, P1, SAB 1, Neonatal death of twins (Chorioamnioitis)    Menstrual History: Menarche age: [age] No LMP recorded.    Past Medical History  Diagnosis Date  . Anemia   . Normal exercise tolerance test results     03-22-2006  . Asherman's syndrome   . Obstruction of fallopian tube     bilateral occlusion  . 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 History of chorioamnionitis     03-10-2015  . History of fetal loss     03-10-2015  non-viable fetal demise twins at 6352w5d due to chorioamnionitis infection with PPROM  . 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 Migraine   . Hydrosalpinx     RIGHT                    Past Surgical History  Procedure Laterality Date  . Wisdom tooth extraction  2016  . Tonsillectomy  2012  . Dilation and curettage of uterus  2006             Family History  Problem Relation Age of Onset  . Anemia Mother    No hereditary disease.  No cancer of breast, ovary, uterus. No cutaneous leiomyomatosis or renal cell carcinoma.  Social History   Social History  . Marital Status: Single    Spouse Name: N/A  . Number of Children: N/A  . Years of Education: N/A   Occupational History  . Not on file.   Social History Main Topics  . Smoking status: Never  Smoker   . Smokeless tobacco: Never Used  . Alcohol Use: No  . Drug Use: No  . Sexual Activity: Not on file   Other Topics Concern  . Not on file   Social History Narrative    Allergies  Allergen Reactions  . Penicillins Hives and Rash    Has patient had a PCN reaction causing immediate rash, facial/tongue/throat swelling, SOB or lightheadedness with hypotension: Yes Has patient had a PCN reaction causing severe rash involving mucus membranes or skin necrosis: No Has patient had a PCN reaction that required hospitalization No Has patient had a PCN reaction occurring within the last 10 years: No If all of the above answers are "NO", then may proceed with Cephalosporin use.     No current facility-administered medications on file prior to encounter.   Current Outpatient Prescriptions on File Prior to Encounter  Medication Sig Dispense Refill  . doxylamine, Sleep, (UNISOM) 25 MG tablet Take 25 mg by mouth at bedtime as needed for sleep.    Marland Kitchen. ibuprofen (ADVIL,MOTRIN) 600 MG tablet Take 1 tablet (600 mg total) by mouth every 6 (six) hours as needed. (Patient taking differently: Take 600 mg by mouth every 6 (six) hours as needed for moderate pain. ) 30 tablet  0  . ketotifen (ALLERGY EYE DROPS) 0.025 % ophthalmic solution Place 1 drop into both eyes 2 (two) times daily as needed (for allergies or dryness).       Review of Systems  Constitutional: Negative.   HENT: Negative.   Eyes: Negative.   Respiratory: Negative.   Cardiovascular: Negative.   Gastrointestinal: Negative.   Genitourinary: Negative.   Musculoskeletal: Negative.   Skin: Negative.   Neurological: Negative.   Endo/Heme/Allergies: Negative.   Psychiatric/Behavioral: Negative.      Physical Exam  Ht  (1.753 m)  Wt 65.772 kg (145 lb)  BMI 21.40 kg/m2  LMP 05/25/2015 (Exact Date) Constitutional: She is oriented to person, place, and time. She appears well-developed and well-nourished.  HENT:  Head:  Normocephalic and atraumatic.  Nose: Nose normal.  Mouth/Throat: Oropharynx is clear and moist. No oropharyngeal exudate.  Eyes: Conjunctivae normal and EOM are normal. Pupils are equal, round, and reactive to light. No scleral icterus.  Neck: Normal range of motion. Neck supple. No tracheal deviation present. No thyromegaly present.  Cardiovascular: Normal rate.   Respiratory: Effort normal and breath sounds normal.  GI: Soft. Bowel sounds are normal. She exhibits no distension and no mass. There is no tenderness.  Lymphadenopathy:    She has no cervical adenopathy.  Neurological: She is alert and oriented to person, place, and time. She has normal reflexes.  Skin: Skin is warm.  Psychiatric: She has a normal mood and affect. Her behavior is normal. Judgment and thought content normal.       Assessment/Plan:  I scheduled patient for hysteroscopy, suction D&C, Lysis of adhesions with stent placement. Pt will have a Rt salpingectomy with novy catherization of the Lt tube. She will have lysis of pelvic adhesions.

## 2015-06-28 ENCOUNTER — Encounter (HOSPITAL_BASED_OUTPATIENT_CLINIC_OR_DEPARTMENT_OTHER): Payer: Self-pay | Admitting: Certified Registered"

## 2015-06-28 ENCOUNTER — Ambulatory Visit (HOSPITAL_BASED_OUTPATIENT_CLINIC_OR_DEPARTMENT_OTHER): Payer: Managed Care, Other (non HMO) | Admitting: Anesthesiology

## 2015-06-28 ENCOUNTER — Ambulatory Visit (HOSPITAL_BASED_OUTPATIENT_CLINIC_OR_DEPARTMENT_OTHER)
Admission: RE | Admit: 2015-06-28 | Discharge: 2015-06-28 | Disposition: A | Discharge status: Home / Self Care | Payer: Managed Care, Other (non HMO) | Source: Ambulatory Visit | Attending: Obstetrics and Gynecology | Admitting: Obstetrics and Gynecology

## 2015-06-28 ENCOUNTER — Encounter (HOSPITAL_BASED_OUTPATIENT_CLINIC_OR_DEPARTMENT_OTHER)
Admission: RE | Disposition: A | Discharge status: Home / Self Care | Payer: Self-pay | Source: Ambulatory Visit | Attending: Obstetrics and Gynecology

## 2015-06-28 DIAGNOSIS — N971 Female infertility of tubal origin: Secondary | ICD-10-CM | POA: Insufficient documentation

## 2015-06-28 DIAGNOSIS — O034 Incomplete spontaneous abortion without complication: Secondary | ICD-10-CM | POA: Insufficient documentation

## 2015-06-28 DIAGNOSIS — N803 Endometriosis of pelvic peritoneum: Secondary | ICD-10-CM | POA: Insufficient documentation

## 2015-06-28 DIAGNOSIS — Z79899 Other long term (current) drug therapy: Secondary | ICD-10-CM | POA: Diagnosis not present

## 2015-06-28 HISTORY — DX: 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 (postinfective): N73.6

## 2015-06-28 HISTORY — PX: HYSTEROSCOPY W/D&C: SHX1775

## 2015-06-28 HISTORY — DX: Personal history of other complications of pregnancy, childbirth and the puerperium: Z87.59

## 2015-06-28 HISTORY — DX: Intrauterine synechiae: N85.6

## 2015-06-28 HISTORY — PX: LAPAROSCOPY: SHX197

## 2015-06-28 HISTORY — DX: Personal history of other diseases of the circulatory system: Z86.79

## 2015-06-28 HISTORY — DX: Reserved for inherently not codable concepts without codable children: IMO0001

## 2015-06-28 HISTORY — DX: Migraine, unspecified, not intractable, without status migrainosus: G43.909

## 2015-06-28 HISTORY — DX: Female infertility of tubal origin: N97.1

## 2015-06-28 HISTORY — DX: Chronic salpingitis: N70.11

## 2015-06-28 LAB — POCT PREGNANCY, URINE: PREG TEST UR: NEGATIVE

## 2015-06-28 LAB — POCT HEMOGLOBIN-HEMACUE: HEMOGLOBIN: 11.1 g/dL — AB (ref 12.0–15.0)

## 2015-06-28 SURGERY — DILATATION AND CURETTAGE /HYSTEROSCOPY
Anesthesia: General | Site: Uterus

## 2015-06-28 MED ORDER — LACTATED RINGERS IV SOLN
INTRAVENOUS | Status: DC
  Administered 2015-06-28 (×4): via INTRAVENOUS
  Filled 2015-06-28: qty 1000

## 2015-06-28 MED ORDER — LIDOCAINE HCL (CARDIAC) 20 MG/ML IV SOLN
INTRAVENOUS | Status: AC
  Filled 2015-06-28: qty 5

## 2015-06-28 MED ORDER — FENTANYL CITRATE (PF) 100 MCG/2ML IJ SOLN
INTRAMUSCULAR | Status: AC
Start: 2015-06-28 — End: 2015-06-28
  Filled 2015-06-28: qty 2

## 2015-06-28 MED ORDER — VASOPRESSIN 20 UNIT/ML IV SOLN
INTRAVENOUS | Status: DC | PRN
  Administered 2015-06-28: 6 mL via INTRAMUSCULAR

## 2015-06-28 MED ORDER — OXYCODONE-ACETAMINOPHEN 7.5-325 MG PO TABS: 1.0000 | ORAL_TABLET | ORAL | Status: DC | PRN

## 2015-06-28 MED ORDER — ONDANSETRON HCL 4 MG/2ML IJ SOLN
INTRAMUSCULAR | Status: AC
  Filled 2015-06-28: qty 2

## 2015-06-28 MED ORDER — ONDANSETRON HCL 4 MG/2ML IJ SOLN
INTRAMUSCULAR | Status: DC | PRN
  Administered 2015-06-28 (×2): 4 mg via INTRAVENOUS

## 2015-06-28 MED ORDER — KETOROLAC TROMETHAMINE 30 MG/ML IJ SOLN
INTRAMUSCULAR | Status: DC | PRN
  Administered 2015-06-28: 30 mg via INTRAVENOUS

## 2015-06-28 MED ORDER — ONDANSETRON HCL 4 MG PO TABS: 4.0000 mg | ORAL_TABLET | Freq: Three times a day (TID) | ORAL | Status: DC | PRN

## 2015-06-28 MED ORDER — GLYCOPYRROLATE 0.2 MG/ML IJ SOLN
INTRAMUSCULAR | Status: AC
  Filled 2015-06-28: qty 1

## 2015-06-28 MED ORDER — ROCURONIUM BROMIDE 100 MG/10ML IV SOLN
INTRAVENOUS | Status: DC | PRN
  Administered 2015-06-28: 20 mg via INTRAVENOUS
  Administered 2015-06-28: 10 mg via INTRAVENOUS

## 2015-06-28 MED ORDER — NEOSTIGMINE METHYLSULFATE 10 MG/10ML IV SOLN
INTRAVENOUS | Status: AC
  Filled 2015-06-28: qty 1

## 2015-06-28 MED ORDER — LIDOCAINE HCL (CARDIAC) 20 MG/ML IV SOLN
INTRAVENOUS | Status: DC | PRN
  Administered 2015-06-28: 60 mg via INTRAVENOUS

## 2015-06-28 MED ORDER — WHITE PETROLATUM GEL
Status: AC
  Filled 2015-06-28: qty 5

## 2015-06-28 MED ORDER — FENTANYL CITRATE (PF) 100 MCG/2ML IJ SOLN
INTRAMUSCULAR | Status: AC
  Filled 2015-06-28: qty 2

## 2015-06-28 MED ORDER — KETOROLAC TROMETHAMINE 30 MG/ML IJ SOLN
INTRAMUSCULAR | Status: AC
  Filled 2015-06-28: qty 1

## 2015-06-28 MED ORDER — METHYLENE BLUE 0.5 % INJ SOLN
INTRAVENOUS | Status: DC | PRN
  Administered 2015-06-28: 10 mL

## 2015-06-28 MED ORDER — NEOSTIGMINE METHYLSULFATE 10 MG/10ML IV SOLN
INTRAVENOUS | Status: DC | PRN
  Administered 2015-06-28: 3 mg via INTRAVENOUS

## 2015-06-28 MED ORDER — ROCURONIUM BROMIDE 50 MG/5ML IV SOLN
INTRAVENOUS | Status: AC
  Filled 2015-06-28: qty 1

## 2015-06-28 MED ORDER — LACTATED RINGERS IR SOLN
Status: DC | PRN
  Administered 2015-06-28: 3000 mL

## 2015-06-28 MED ORDER — FENTANYL CITRATE (PF) 100 MCG/2ML IJ SOLN
INTRAMUSCULAR | Status: DC | PRN
  Administered 2015-06-28: 50 ug via INTRAVENOUS
  Administered 2015-06-28 (×2): 25 ug via INTRAVENOUS
  Administered 2015-06-28: 50 ug via INTRAVENOUS

## 2015-06-28 MED ORDER — HYDROMORPHONE HCL 1 MG/ML IJ SOLN
0.2500 mg | INTRAMUSCULAR | Status: DC | PRN
Start: 2015-06-28 — End: 2015-06-28
  Administered 2015-06-28: 0.25 mg via INTRAVENOUS
  Filled 2015-06-28: qty 1

## 2015-06-28 MED ORDER — CLINDAMYCIN PHOSPHATE 900 MG/50ML IV SOLN
900.0000 mg | INTRAVENOUS | Status: AC
  Administered 2015-06-28: 900 mg via INTRAVENOUS
  Filled 2015-06-28: qty 50

## 2015-06-28 MED ORDER — DEXAMETHASONE SODIUM PHOSPHATE 4 MG/ML IJ SOLN
INTRAMUSCULAR | Status: DC | PRN
  Administered 2015-06-28: 10 mg via INTRAVENOUS

## 2015-06-28 MED ORDER — SODIUM CHLORIDE 0.9 % IR SOLN
Status: DC | PRN
  Administered 2015-06-28: 3000 mL

## 2015-06-28 MED ORDER — PROPOFOL 10 MG/ML IV BOLUS
INTRAVENOUS | Status: DC | PRN
  Administered 2015-06-28: 200 mg via INTRAVENOUS

## 2015-06-28 MED ORDER — SUCCINYLCHOLINE CHLORIDE 20 MG/ML IJ SOLN
INTRAMUSCULAR | Status: DC | PRN
  Administered 2015-06-28: 50 mg via INTRAVENOUS

## 2015-06-28 MED ORDER — CIPROFLOXACIN IN D5W 400 MG/200ML IV SOLN
400.0000 mg | INTRAVENOUS | Status: AC
  Administered 2015-06-28: 400 mg via INTRAVENOUS
  Filled 2015-06-28: qty 200

## 2015-06-28 MED ORDER — DEXAMETHASONE SODIUM PHOSPHATE 10 MG/ML IJ SOLN
INTRAMUSCULAR | Status: AC
  Filled 2015-06-28: qty 1

## 2015-06-28 MED ORDER — ACETAMINOPHEN 160 MG/5ML PO SOLN
325.0000 mg | ORAL | Status: DC | PRN
  Filled 2015-06-28: qty 20.3

## 2015-06-28 MED ORDER — OXYCODONE HCL 5 MG PO TABS
5.0000 mg | ORAL_TABLET | Freq: Once | ORAL | Status: AC | PRN
  Administered 2015-06-28: 5 mg via ORAL
  Filled 2015-06-28: qty 1

## 2015-06-28 MED ORDER — BUPIVACAINE-EPINEPHRINE (PF) 0.5% -1:200000 IJ SOLN
INTRAMUSCULAR | Status: DC | PRN
  Administered 2015-06-28: 10 mL

## 2015-06-28 MED ORDER — HYDROMORPHONE HCL 1 MG/ML IJ SOLN
INTRAMUSCULAR | Status: AC
  Filled 2015-06-28: qty 1

## 2015-06-28 MED ORDER — ACETAMINOPHEN 325 MG PO TABS
325.0000 mg | ORAL_TABLET | ORAL | Status: DC | PRN
  Filled 2015-06-28: qty 2

## 2015-06-28 MED ORDER — CIPROFLOXACIN IN D5W 400 MG/200ML IV SOLN
INTRAVENOUS | Status: AC
  Filled 2015-06-28: qty 200

## 2015-06-28 MED ORDER — OXYCODONE HCL 5 MG PO TABS
ORAL_TABLET | ORAL | Status: AC
  Filled 2015-06-28: qty 1

## 2015-06-28 MED ORDER — PROPOFOL 10 MG/ML IV BOLUS
INTRAVENOUS | Status: AC
  Filled 2015-06-28: qty 20

## 2015-06-28 MED ORDER — GLYCOPYRROLATE 0.2 MG/ML IJ SOLN
INTRAMUSCULAR | Status: DC | PRN
  Administered 2015-06-28: 0.4 mg via INTRAVENOUS

## 2015-06-28 MED ORDER — CLINDAMYCIN PHOSPHATE 900 MG/50ML IV SOLN
INTRAVENOUS | Status: AC
  Filled 2015-06-28: qty 50

## 2015-06-28 MED ORDER — MIDAZOLAM HCL 5 MG/5ML IJ SOLN
INTRAMUSCULAR | Status: DC | PRN
  Administered 2015-06-28: 2 mg via INTRAVENOUS

## 2015-06-28 MED ORDER — OXYCODONE HCL 5 MG/5ML PO SOLN
5.0000 mg | Freq: Once | ORAL | Status: AC | PRN
  Filled 2015-06-28: qty 5

## 2015-06-28 MED ORDER — MIDAZOLAM HCL 2 MG/2ML IJ SOLN
INTRAMUSCULAR | Status: AC
  Filled 2015-06-28: qty 2

## 2015-06-28 SURGICAL SUPPLY — 91 items
BLADE SURG 11 STRL SS (BLADE) ×5 IMPLANT
CANISTER SUCTION 2500CC (MISCELLANEOUS) ×5 IMPLANT
CANNULA CURETTE W/SYR 6 (CANNULA) ×4 IMPLANT
CANNULA CURETTE W/SYR 6MM (CANNULA) ×1
CANNULA CURETTE W/SYR 7 (CANNULA) IMPLANT
CANNULA CURETTE W/SYR 7MM (CANNULA)
CATH HSG 5FRX28CM (CATHETERS) IMPLANT
CATH INTRA ACCESS BALLN (BALLOONS) ×5 IMPLANT
CATH ROBINSON RED A/P 16FR (CATHETERS) IMPLANT
CATH SALPINGOGRAPHY SELECT (BALLOONS) ×5 IMPLANT
CATH SSG INJECTION W/GUIDEWIRE (BALLOONS) ×5 IMPLANT
CORD ACTIVE DISPOSABLE (ELECTRODE)
CORD ELECTRO ACTIVE DISP (ELECTRODE) IMPLANT
COVER BACK TABLE 60X90IN (DRAPES) ×10 IMPLANT
COVER MAYO STAND STRL (DRAPES) ×5 IMPLANT
DEVICE TROCAR PUNCTURE CLOSURE (ENDOMECHANICALS) IMPLANT
DRAPE HYSTEROSCOPY (DRAPE) ×5 IMPLANT
DRAPE LG THREE QUARTER DISP (DRAPES) ×10 IMPLANT
DRAPE UNDERBUTTOCKS STRL (DRAPE) ×5 IMPLANT
DRSG OPSITE POSTOP 3X4 (GAUZE/BANDAGES/DRESSINGS) IMPLANT
ELECT LOOP GYNE PRO 24FR (CUTTING LOOP)
ELECT NEEDLE TIP 2.8 STRL (NEEDLE) IMPLANT
ELECT REM PT RETURN 9FT ADLT (ELECTROSURGICAL) ×5
ELECTRODE KNIFE SHAPED 22FR (ELECTROSURGICAL) IMPLANT
ELECTRODE LOOP GYNE PRO 24FR (CUTTING LOOP) IMPLANT
ELECTRODE REM PT RTRN 9FT ADLT (ELECTROSURGICAL) ×3 IMPLANT
EVACUATOR SMOKE 8.L (FILTER) IMPLANT
GLOVE BIO SURGEON STRL SZ8 (GLOVE) ×5 IMPLANT
GLOVE BIOGEL PI IND STRL 8.5 (GLOVE) ×3 IMPLANT
GLOVE BIOGEL PI INDICATOR 8.5 (GLOVE) ×2
GOWN STRL REUS W/ TWL LRG LVL3 (GOWN DISPOSABLE) ×6 IMPLANT
GOWN STRL REUS W/ TWL XL LVL3 (GOWN DISPOSABLE) ×6 IMPLANT
GOWN STRL REUS W/TWL LRG LVL3 (GOWN DISPOSABLE) ×4
GOWN STRL REUS W/TWL XL LVL3 (GOWN DISPOSABLE) ×4
HEMOSTAT SURGICEL 2X14 (HEMOSTASIS) ×15 IMPLANT
HOLDER FOLEY CATH W/STRAP (MISCELLANEOUS) ×5 IMPLANT
KIT ROOM TURNOVER WOR (KITS) ×5 IMPLANT
LEGGING LITHOTOMY PAIR STRL (DRAPES) ×5 IMPLANT
LIQUID BAND (GAUZE/BANDAGES/DRESSINGS) ×5 IMPLANT
LOOP ANGLED CUTTING 22FR (CUTTING LOOP) IMPLANT
MANIFOLD NEPTUNE II (INSTRUMENTS) IMPLANT
MANIPULATOR UTERINE 4.5 ZUMI (MISCELLANEOUS) ×5 IMPLANT
NEEDLE HYPO 25X1 1.5 SAFETY (NEEDLE) ×5 IMPLANT
NEEDLE INSUFFLATION 120MM (ENDOMECHANICALS) ×5 IMPLANT
NEEDLE INSUFFLATION 14GA 120MM (NEEDLE) ×5 IMPLANT
NEEDLE SPNL 22GX3.5 QUINCKE BK (NEEDLE) ×5 IMPLANT
NS IRRIG 500ML POUR BTL (IV SOLUTION) ×5 IMPLANT
PACK BASIN DAY SURGERY FS (CUSTOM PROCEDURE TRAY) ×5 IMPLANT
PACK LAPAROSCOPY II (CUSTOM PROCEDURE TRAY) ×5 IMPLANT
PAD OB MATERNITY 4.3X12.25 (PERSONAL CARE ITEMS) ×5 IMPLANT
PADDING ION DISPOSABLE (MISCELLANEOUS) ×5 IMPLANT
PENCIL BUTTON HOLSTER BLD 10FT (ELECTRODE) IMPLANT
POUCH SPECIMEN RETRIEVAL 10MM (ENDOMECHANICALS) IMPLANT
SCALPEL HARMONIC ACE (MISCELLANEOUS) IMPLANT
SEALER TISSUE G2 CVD JAW 35 (ENDOMECHANICALS) IMPLANT
SEALER TISSUE G2 CVD JAW 45CM (ENDOMECHANICALS) IMPLANT
SEPRAFILM MEMBRANE 5X6 (MISCELLANEOUS) IMPLANT
SET IRRIG TUBING LAPAROSCOPIC (IRRIGATION / IRRIGATOR) ×5 IMPLANT
SET IRRIG Y TYPE TUR BLADDER L (SET/KITS/TRAYS/PACK) IMPLANT
SOLUTION ANTI FOG 6CC (MISCELLANEOUS) ×5 IMPLANT
STENT BALLN UTERINE 4CM 6FR (STENTS) IMPLANT
SUT MNCRL AB 4-0 PS2 18 (SUTURE) ×5 IMPLANT
SUT PROLENE 0 CT 1 30 (SUTURE) IMPLANT
SUT SILK 2 0 SH (SUTURE) IMPLANT
SUT SILK 3 0 PS 1 (SUTURE) IMPLANT
SUT VIC AB 2-0 CT1 27 (SUTURE)
SUT VIC AB 2-0 CT1 TAPERPNT 27 (SUTURE) IMPLANT
SUT VIC AB 2-0 CT2 27 (SUTURE) IMPLANT
SUT VIC AB 2-0 UR6 27 (SUTURE) IMPLANT
SUT VIC AB 4-0 SH 27 (SUTURE) ×2
SUT VIC AB 4-0 SH 27XBRD (SUTURE) ×3 IMPLANT
SUT VICRYL 0 TIES 12 18 (SUTURE) IMPLANT
SYR 20CC LL (SYRINGE) ×10 IMPLANT
SYR 30ML LL (SYRINGE) ×5 IMPLANT
SYR 3ML 18GX1 1/2 (SYRINGE) ×5 IMPLANT
SYR 3ML 23GX1 SAFETY (SYRINGE) IMPLANT
SYR 5ML LL (SYRINGE) ×5 IMPLANT
SYR CONTROL 10ML LL (SYRINGE) ×5 IMPLANT
SYRINGE 12CC LL (MISCELLANEOUS) ×5 IMPLANT
SYS LAPSCP GELPORT 120MM (MISCELLANEOUS)
SYSTEM LAPSCP GELPORT 120MM (MISCELLANEOUS) IMPLANT
TOWEL OR 17X24 6PK STRL BLUE (TOWEL DISPOSABLE) ×10 IMPLANT
TRAY DSU PREP LF (CUSTOM PROCEDURE TRAY) ×5 IMPLANT
TRAY FOLEY CATH SILVER 14FR (SET/KITS/TRAYS/PACK) ×5 IMPLANT
TROCAR OPTI TIP 5M 100M (ENDOMECHANICALS) ×15 IMPLANT
TROCAR XCEL DIL TIP R 11M (ENDOMECHANICALS) IMPLANT
TUBE CONNECTING 12'X1/4 (SUCTIONS) ×1
TUBE CONNECTING 12X1/4 (SUCTIONS) ×4 IMPLANT
TUBING INSUFFLATION 10FT LAP (TUBING) ×10 IMPLANT
WARMER LAPAROSCOPE (MISCELLANEOUS) ×5 IMPLANT
WATER STERILE IRR 500ML POUR (IV SOLUTION) ×5 IMPLANT

## 2015-06-28 NOTE — Transfer of Care (Signed)
Immediate Anesthesia Transfer of Care Note  Patient: April Rodgers  Procedure(s) Performed: Procedure(s): SUCTION DILATATION AND CURETTAGE /HYSTEROSCOPY,  (N/A) LAPAROSCOPY LYSIS OF ADHESIONS, RIGHT SALPINGECTOMY WITH NOVY CATHERIZATION OF LEFT TUBE,EXCISION OF ENDOMETROSIS (Left)  Patient Location: PACU  Anesthesia Type:General  Level of Consciousness: awake, alert , oriented and patient cooperative  Airway & Oxygen Therapy: Patient Spontanous Breathing and Patient connected to face mask oxygen  Post-op Assessment: Report given to RN and Post -op Vital signs reviewed and stable  Post vital signs: Reviewed and stable  Last Vitals:  Filed Vitals:   06/28/15 0852  BP: 118/71  Pulse: 86  Temp: 36.9 C  Resp: 18    Last Pain:  Filed Vitals:   06/28/15 0900  PainSc: 6       Patients Stated Pain Goal: 6 (06/28/15 0859)  Complications: No apparent anesthesia complications

## 2015-06-28 NOTE — Anesthesia Postprocedure Evaluation (Signed)
Anesthesia Post Note  Patient: April Rodgers  Procedure(s) Performed: Procedure(s) (LRB): SUCTION DILATATION AND CURETTAGE /HYSTEROSCOPY,  (N/A) LAPAROSCOPY LYSIS OF ADHESIONS, RIGHT SALPINGECTOMY WITH NOVY CATHERIZATION OF LEFT TUBE,EXCISION OF ENDOMETROSIS (Left)  Patient location during evaluation: PACU Anesthesia Type: General Level of consciousness: awake and alert Pain management: pain level controlled Vital Signs Assessment: post-procedure vital signs reviewed and stable Respiratory status: spontaneous breathing, nonlabored ventilation, respiratory function stable and patient connected to nasal cannula oxygen Cardiovascular status: blood pressure returned to baseline and stable Postop Assessment: no signs of nausea or vomiting Anesthetic complications: no    Last Vitals:  Filed Vitals:   06/28/15 1345 06/28/15 1400  BP: 113/67 112/69  Pulse: 61 61  Temp:    Resp: 15 17    Last Pain:  Filed Vitals:   06/28/15 1441  PainSc: 7                  [name] DAVID

## 2015-06-28 NOTE — Anesthesia Procedure Notes (Signed)
Procedure Name: Intubation Date/Time: 06/28/2015 10:31 AM Performed by: Renella CunasHAZEL, Dareion Kneece D Pre-anesthesia Checklist: Patient identified, Emergency Drugs available, Suction available and Patient being monitored Patient Re-evaluated:Patient Re-evaluated prior to inductionOxygen Delivery Method: Circle System Utilized Preoxygenation: Pre-oxygenation with 100% oxygen Intubation Type: IV induction Ventilation: Mask ventilation without difficulty Laryngoscope Size: Mac and 3 Grade View: Grade I Tube type: Oral Tube size: 7.0 mm Number of attempts: 1 Airway Equipment and Method: Stylet and Oral airway Placement Confirmation: ETT inserted through vocal cords under direct vision,  positive ETCO2 and breath sounds checked- equal and bilateral Secured at: 22 cm Tube secured with: Tape Dental Injury: Teeth and Oropharynx as per pre-operative assessment

## 2015-06-28 NOTE — Discharge Instructions (Signed)
HOME CARE INSTRUCTIONS - LAPAROSCOPY  Wound Care: The bandaids or dressing which are placed over the skin openings may be removed the day after surgery. The incision should be kept clean and dry. The stitches do not need to be removed. Should the incision become sore, red, and swollen after the first week, check with your doctor.  Personal Hygiene: Shower the day after your procedure. Always wipe from front to back after elimination.   Activity: Do not drive or operate any equipment today. The effects of the anesthesia are still present and drowsiness may result. Rest today, not necessarily flat bed rest, just take it easy. You may resume your normal activity in one to three days or as instructed by your physician.  Sexual Activity: You resume sexual activity as indicated by your physician_________. If your laparoscopy was for a sterilization ( tubes tied ), continue current method of birth control until after your next period or ask for specific instructions from your doctor.  Diet: Eat a light diet as desired this evening. You may resume a regular diet tomorrow.  Return to Work: Two to three days or as indicated by your doctor.  Expectations After Surgery: Your surgery will cause vaginal drainage or spotting which may continue for 2-3 days. Mild abdominal discomfort or tenderness is not unusual and some shoulder pain may also be noted which can be relieved by lying flat in pain.  Call Your Doctor If these Occur:  Persistent or heavy bleeding at incision site       Redness or swelling around incision       Elevation of temperature greater than 100 degrees F  Call for follow-up appointment _____________.    [facility] Home care Instructions:   Personal hygiene:  Used sanitary napkins for vaginal drainage not tampons. Shower or tub bathe the day after your procedure. No douching until bleeding stops. Always wipe from front to back after  Elimination.  Activity: Do not drive or operate any  equipment today. The effects of the anesthesia are still present and drowsiness may result. Rest today, not necessarily flat bed rest, just take it easy. You may resume your normal activity in one to 2 days.  Sexual activity: No intercourse for one week or as indicated by your physician  Diet: Eat a light diet as desired this evening. You may resume a regular diet tomorrow.  Return to work: One to 2 days.  General Expectations of your surgery: Vaginal bleeding should be no heavier than a normal period. Spotting may continue up to 10 days. Mild cramps may continue for a couple of days. You may have a regular period in 2-6 weeks.  Unexpected observations call your doctor if these occur: persistent or heavy bleeding. Severe abdominal cramping or pain. Elevation of temperature greater than 100F.  Call for an appointment in one week.    Patient's Signature_______________________________________________________  Nurse's Signature________________________________________________________  Post Anesthesia Home Care Instructions  Activity: Get plenty of rest for the remainder of the day. A responsible adult should stay with you for 24 hours following the procedure.  For the next 24 hours, DO NOT: -Drive a car -Advertising copywriter -Drink alcoholic beverages -Take any medication unless instructed by your physician -Make any legal decisions or sign important papers.  Meals: Start with liquid foods such as gelatin or soup. Progress to regular foods as tolerated. Avoid greasy, spicy, heavy foods. If nausea and/or vomiting occur, drink only clear liquids until the nausea and/or vomiting subsides. Call your  physician if vomiting continues.  Special Instructions/Symptoms: Your throat may feel dry or sore from the anesthesia or the breathing tube placed in your throat during surgery. If this causes discomfort, gargle with warm salt water. The discomfort should disappear within 24 hours.  If you had a  scopolamine patch placed behind your ear for the management of post- operative nausea and/or vomiting:  1. The medication in the patch is effective for 72 hours, after which it should be removed.  Wrap patch in a tissue and discard in the trash. Wash hands thoroughly with soap and water. 2. You may remove the patch earlier than 72 hours if you experience unpleasant side effects which may include dry mouth, dizziness or visual disturbances. 3. Avoid touching the patch. Wash your hands with soap and water after contact with the patch.

## 2015-06-28 NOTE — Anesthesia Preprocedure Evaluation (Signed)
Anesthesia Evaluation  Patient identified by MRN, date of birth, ID band Patient awake    Reviewed: Allergy & Precautions, NPO status , Patient's Chart, lab work & pertinent test results  History of Anesthesia Complications Negative for: history of anesthetic complications  Airway Mallampati: II  TM Distance: >3 FB Neck ROM: Full    Dental  (+) Teeth Intact   Pulmonary neg pulmonary ROS,    breath sounds clear to auscultation       Cardiovascular + Valvular Problems/Murmurs MVP  Rhythm:Regular     Neuro/Psych  Headaches, negative psych ROS   GI/Hepatic negative GI ROS, Neg liver ROS,   Endo/Other  negative endocrine ROS  Renal/GU negative Renal ROS     Musculoskeletal negative musculoskeletal ROS (+)   Abdominal   Peds  Hematology  (+) anemia ,   Anesthesia Other Findings   Reproductive/Obstetrics                             Anesthesia Physical Anesthesia Plan  ASA: II  Anesthesia Plan: General   Post-op Pain Management:    Induction: Intravenous  Airway Management Planned: Oral ETT  Additional Equipment: None  Intra-op Plan:   Post-operative Plan: Extubation in OR  Informed Consent: I have reviewed the patients History and Physical, chart, labs and discussed the procedure including the risks, benefits and alternatives for the proposed anesthesia with the patient or authorized representative who has indicated his/her understanding and acceptance.   Dental advisory given  Plan Discussed with: Surgeon and CRNA  Anesthesia Plan Comments:         Anesthesia Quick Evaluation

## 2015-06-29 ENCOUNTER — Encounter (HOSPITAL_BASED_OUTPATIENT_CLINIC_OR_DEPARTMENT_OTHER): Payer: Self-pay | Admitting: Obstetrics and Gynecology

## 2015-07-02 NOTE — Op Note (Addendum)
Operative Note  Preoperative diagnosis: Rule out intrauterine adhesions, left proximal tubal occlusion, rule out right hydrosalpinx  Postoperative diagnosis: Retained products of conception, left proximal tubal occlusion, stage I endometriosis of pelvic peritoneum  Procedure: Hysteroscopy, suction D&C, Novy catheterization of left tube, laparoscopy, electrosurgical excision and ablation of peritoneal lesions  Anesthesia: Gen. endotracheal  Complications: None  Estimated blood loss: 200 cc  Specimens: Uterine curettings, and left and right posterior cul de sac biopsies to pathology.  Findings: On hysteroscopy, endocervical canal was normal.  There were 1-3 mm areas of retained products of conception, particularly on the posterior wall of the endometrium.  Both tubal ostia were seen.  The uterine cavity was of normal size and arcuate configuration. The uterus sounded to 9 cm. On laparoscopy, the liver surface, gallbladder, and diaphragm surfaces were normal.  The appendix appeared grossly normal. Anterior cul-de-sac peritoneum contained a brown lesion of endometriosis on the right.  The uterus appeared grossly normal. Both fallopian tubes were normal in their proximal and distal segments.  The fimbria were rated as 5 out 5. After successful recannulization of its proximal segment, the left tube was patent to chromotubation. The right tube did not fill with methylene blue but we considered this to be an artifact because she had previous filling of the proximal and middle portion of the right tube. Both ovaries appeared normal as well as the ovarian fossae.   Description of the procedure: The patient was placed in dorsal supine position and general endotracheal anesthesia was given. 2 g of cefazolin were given intravenously for prophylaxis. Patient was placed in lithotomy position. She was prepped and draped in sterile manner.  The bladder was catheterized with a foley catheter.   After preemptive  anesthesia of old proposed incisions with 0.25% bupivacaine and 1:100,000 epinephrine, and infraumbilical 5 mm vertical skin incision was made.  It Verress needle was inserted and its correct location was confirmed.  A pneumoperitoneum was created with carbon dioxide.  A 5 mm trocar was inserted and a corresponding size 30 laparoscope was introduced.  Video laparoscopy was started with the patient in Trendelenburg position. 2 more 5 mm incisions were made in each lower quadrant and corresponding trochars were inserted under direct visualization.  Ancillary laparoscopic instruments were used through these ports throughout the procedure. The surgeon left the laparoscopic surveillance of the pelvis to his assistance and proceeded with the hysteroscopy. A dilute vasopressin solution was injected into the cervix (0.4 units per mL).  A Slimline 12 hysteroscope was directly inserted into the endocervical canal and then steered into the endometrial cavity.  Above findings were noted.  Using hysteroscopic scissors we undermined each of the remaining 1-3 mm of protrusions which consisted of retained products of conception.  We then inserted a manual evacuation device (HandiVac) attached to a 6 mm curet and gently curetted the endometrium to remove these undermined areas of products of conception.  Hysteroscopy was repeated to confirm complete removal of these lesions.   We then proceeded with recannulization of the preoperatively determined left proximal tubal occlusion. A Novy catheter was inserted through the hysteroscopic operative channel and its tip was wedged into the left tubal ostium.  A 3 French cornual access catheter with a 0.018 in guidewire was passed coaxially through the Scofield and then pushed into the proximal segment of the left tube, while the assistant was straightening the proximal portion of the left tube laparoscopically with atraumatic graspers.  Easy insertion of the catheter set into the proximal  left tube was noted laparoscopically.  At this point the guidewire was pulled out and a selective hydrotubation was performed with the diluted methylene blue solution, confirming patency of the rest of the left tube.  The hysteroscopic procedure was then terminated. We then inserted a ZUMI manipulator into the uterus and inflated the balloon. The surgeon was regloved and returned to the operative field for the laparoscopy. We repeated a chromopertubation and confirmed the patency of the left tube.  The right tube which had preoperatively showing opacification of the proximal one half and perhaps a distal hydrosalpinx did not fill at all, we attributed this to an artifact. Using a needle electrode, the anterior cul-de-sac and posterior left and right cul-de-sac lesions of endometriosis were excised and ablated.  Particularly the right posterior cul-de-sac lesion proved to be a deep lesion penetrating about 6-8 mm towards the rectal wall.  Repetitive rectal exams were performed during the dissection of the deep nodule to prevent distracting the integrity of the rectal wall.  Otherwise the rectal mucosa was not involved with the lesion.  We encountered arteriolar bleeding during this dissection which was stopped with electrocoagulation and pressure application. The lesions were taken out and submitted to pathology. The pelvis was copiously irrigated and aspirated.  A large piece of Surgicel was applied for additional hemostasis of the right posterior cul-de-sac defect. The gas was allowed to escape.  The trochars were removed.  The instrument and lap pad count were correct. Dermabond was used for closure of the skin incisions. The patient tolerated the procedure well and was transferred to recovery room in satisfactory condition.  Fermin SchwabYALCINKAYA,Estefana Taylor, MD

## 2015-10-27 LAB — OB RESULTS CONSOLE ABO/RH: RH Type: POSITIVE

## 2015-10-27 LAB — OB RESULTS CONSOLE HIV ANTIBODY (ROUTINE TESTING): HIV: NONREACTIVE

## 2015-10-27 LAB — OB RESULTS CONSOLE ANTIBODY SCREEN: Antibody Screen: NEGATIVE

## 2015-10-27 LAB — OB RESULTS CONSOLE GC/CHLAMYDIA
CHLAMYDIA, DNA PROBE: NEGATIVE
GC PROBE AMP, GENITAL: NEGATIVE

## 2015-10-27 LAB — OB RESULTS CONSOLE RUBELLA ANTIBODY, IGM: RUBELLA: IMMUNE

## 2015-10-27 LAB — OB RESULTS CONSOLE HEPATITIS B SURFACE ANTIGEN: HEP B S AG: NEGATIVE

## 2015-10-27 LAB — OB RESULTS CONSOLE RPR: RPR: NONREACTIVE

## 2016-03-05 ENCOUNTER — Other Ambulatory Visit (HOSPITAL_COMMUNITY): Payer: Self-pay | Admitting: *Deleted

## 2016-03-06 ENCOUNTER — Ambulatory Visit (HOSPITAL_COMMUNITY)
Admission: RE | Admit: 2016-03-06 | Discharge: 2016-03-06 | Disposition: A | Discharge status: Home / Self Care | Payer: Managed Care, Other (non HMO) | Source: Ambulatory Visit | Attending: Obstetrics and Gynecology | Admitting: Obstetrics and Gynecology

## 2016-03-06 DIAGNOSIS — Z3A00 Weeks of gestation of pregnancy not specified: Secondary | ICD-10-CM | POA: Insufficient documentation

## 2016-03-06 DIAGNOSIS — O99012 Anemia complicating pregnancy, second trimester: Secondary | ICD-10-CM | POA: Diagnosis present

## 2016-03-06 MED ORDER — SODIUM CHLORIDE 0.9 % IV SOLN
510.0000 mg | INTRAVENOUS | Status: DC
  Administered 2016-03-06: 510 mg via INTRAVENOUS
  Filled 2016-03-06: qty 17

## 2016-03-06 NOTE — Discharge Instructions (Signed)
Ferumoxytol injection What is this medicine? FERUMOXYTOL is an iron complex. Iron is used to make healthy red blood cells, which carry oxygen and nutrients throughout the body. This medicine is used to treat iron deficiency anemia in people with chronic kidney disease. COMMON BRAND NAME(S): Feraheme What should I tell my health care provider before I take this medicine? They need to know if you have any of these conditions: -anemia not caused by low iron levels -high levels of iron in the blood -magnetic resonance imaging (MRI) test scheduled -an unusual or allergic reaction to iron, other medicines, foods, dyes, or preservatives -pregnant or trying to get pregnant -breast-feeding How should I use this medicine? This medicine is for injection into a vein. It is given by a health care professional in a hospital or clinic setting. Talk to your pediatrician regarding the use of this medicine in children. Special care may be needed. What if I miss a dose? It is important not to miss your dose. Call your doctor or health care professional if you are unable to keep an appointment. What may interact with this medicine? This medicine may interact with the following medications: -other iron products What should I watch for while using this medicine? Visit your doctor or healthcare professional regularly. Tell your doctor or healthcare professional if your symptoms do not start to get better or if they get worse. You may need blood work done while you are taking this medicine. You may need to follow a special diet. Talk to your doctor. Foods that contain iron include: whole grains/cereals, dried fruits, beans, or peas, leafy green vegetables, and organ meats (liver, kidney). What side effects may I notice from receiving this medicine? Side effects that you should report to your doctor or health care professional as soon as possible: -allergic reactions like skin rash, itching or hives, swelling of the  face, lips, or tongue -breathing problems -changes in blood pressure -feeling faint or lightheaded, falls -fever or chills -flushing, sweating, or hot feelings -swelling of the ankles or feet Side effects that usually do not require medical attention (report to your doctor or health care professional if they continue or are bothersome): -diarrhea -headache -nausea, vomiting -stomach pain Where should I keep my medicine? This drug is given in a hospital or clinic and will not be stored at home.  2017 [employer]vier/Gold Standard (2015-02-24 12:41:49)  

## 2016-03-08 ENCOUNTER — Other Ambulatory Visit (HOSPITAL_COMMUNITY): Payer: Self-pay | Admitting: *Deleted

## 2016-03-09 ENCOUNTER — Ambulatory Visit (HOSPITAL_COMMUNITY)
Admission: RE | Admit: 2016-03-09 | Discharge: 2016-03-09 | Disposition: A | Discharge status: Home / Self Care | Payer: Managed Care, Other (non HMO) | Source: Ambulatory Visit | Attending: Obstetrics and Gynecology | Admitting: Obstetrics and Gynecology

## 2016-03-09 MED ORDER — SODIUM CHLORIDE 0.9 % IV SOLN
510.0000 mg | INTRAVENOUS | Status: DC
  Filled 2016-03-09: qty 17

## 2016-03-09 NOTE — Progress Notes (Signed)
Patient states that she has had a headache 7/10 since her first dose of Feraheme. States that she has migraines but that this is not the same kind of headache and not as bad as a migraine. Denies taking anything for pain.I called [facility] and reported. Dr. Zelphia CairoGretchen Adkins called back and instructed not to give Feraheme today but to have patient call and come to office on Monday to see MD and to reschedule second dose of Feraheme for next week. Patient verbalizes understanding. Rescheduled for Tuesday. Sent home with appt. Card with instructions to call and schedule office visit on Monday and come here for second dose Tuesday.

## 2016-03-09 NOTE — Discharge Instructions (Signed)
Ferumoxytol injection What is this medicine? FERUMOXYTOL is an iron complex. Iron is used to make healthy red blood cells, which carry oxygen and nutrients throughout the body. This medicine is used to treat iron deficiency anemia in people with chronic kidney disease. COMMON BRAND NAME(S): Feraheme What should I tell my health care provider before I take this medicine? They need to know if you have any of these conditions: -anemia not caused by low iron levels -high levels of iron in the blood -magnetic resonance imaging (MRI) test scheduled -an unusual or allergic reaction to iron, other medicines, foods, dyes, or preservatives -pregnant or trying to get pregnant -breast-feeding How should I use this medicine? This medicine is for injection into a vein. It is given by a health care professional in a hospital or clinic setting. Talk to your pediatrician regarding the use of this medicine in children. Special care may be needed. What if I miss a dose? It is important not to miss your dose. Call your doctor or health care professional if you are unable to keep an appointment. What may interact with this medicine? This medicine may interact with the following medications: -other iron products What should I watch for while using this medicine? Visit your doctor or healthcare professional regularly. Tell your doctor or healthcare professional if your symptoms do not start to get better or if they get worse. You may need blood work done while you are taking this medicine. You may need to follow a special diet. Talk to your doctor. Foods that contain iron include: whole grains/cereals, dried fruits, beans, or peas, leafy green vegetables, and organ meats (liver, kidney). What side effects may I notice from receiving this medicine? Side effects that you should report to your doctor or health care professional as soon as possible: -allergic reactions like skin rash, itching or hives, swelling of the  face, lips, or tongue -breathing problems -changes in blood pressure -feeling faint or lightheaded, falls -fever or chills -flushing, sweating, or hot feelings -swelling of the ankles or feet Side effects that usually do not require medical attention (report to your doctor or health care professional if they continue or are bothersome): -diarrhea -headache -nausea, vomiting -stomach pain Where should I keep my medicine? This drug is given in a hospital or clinic and will not be stored at home.  2017 [employer]vier/Gold Standard (2015-02-24 12:41:49)  

## 2016-03-12 ENCOUNTER — Other Ambulatory Visit (HOSPITAL_COMMUNITY): Payer: Self-pay | Admitting: *Deleted

## 2016-03-13 ENCOUNTER — Ambulatory Visit (HOSPITAL_COMMUNITY)
Admission: RE | Admit: 2016-03-13 | Discharge: 2016-03-13 | Disposition: A | Discharge status: Home / Self Care | Payer: Managed Care, Other (non HMO) | Source: Ambulatory Visit | Attending: Obstetrics and Gynecology | Admitting: Obstetrics and Gynecology

## 2016-03-13 DIAGNOSIS — Z3A00 Weeks of gestation of pregnancy not specified: Secondary | ICD-10-CM | POA: Diagnosis not present

## 2016-03-13 DIAGNOSIS — O99012 Anemia complicating pregnancy, second trimester: Secondary | ICD-10-CM | POA: Insufficient documentation

## 2016-03-13 MED ORDER — SODIUM CHLORIDE 0.9 % IV SOLN
510.0000 mg | INTRAVENOUS | Status: AC
  Administered 2016-03-13: 510 mg via INTRAVENOUS
  Filled 2016-03-13: qty 17

## 2016-03-19 ENCOUNTER — Inpatient Hospital Stay (HOSPITAL_COMMUNITY)
Admission: AD | Admit: 2016-03-19 | Discharge: 2016-03-19 | Disposition: A | Discharge status: Home / Self Care | Payer: Managed Care, Other (non HMO) | Source: Ambulatory Visit | Attending: Obstetrics and Gynecology | Admitting: Obstetrics and Gynecology

## 2016-03-19 ENCOUNTER — Inpatient Hospital Stay (HOSPITAL_COMMUNITY): Payer: Managed Care, Other (non HMO)

## 2016-03-19 ENCOUNTER — Encounter (HOSPITAL_COMMUNITY): Payer: Self-pay

## 2016-03-19 DIAGNOSIS — O4693 Antepartum hemorrhage, unspecified, third trimester: Secondary | ICD-10-CM

## 2016-03-19 DIAGNOSIS — Z3A31 31 weeks gestation of pregnancy: Secondary | ICD-10-CM | POA: Diagnosis not present

## 2016-03-19 DIAGNOSIS — O26853 Spotting complicating pregnancy, third trimester: Secondary | ICD-10-CM | POA: Diagnosis not present

## 2016-03-19 DIAGNOSIS — N939 Abnormal uterine and vaginal bleeding, unspecified: Secondary | ICD-10-CM

## 2016-03-19 DIAGNOSIS — Z880 Allergy status to penicillin: Secondary | ICD-10-CM | POA: Diagnosis not present

## 2016-03-19 LAB — WET PREP, GENITAL
CLUE CELLS WET PREP: NONE SEEN
Sperm: NONE SEEN
Trich, Wet Prep: NONE SEEN
Yeast Wet Prep HPF POC: NONE SEEN

## 2016-03-19 LAB — URINALYSIS, ROUTINE W REFLEX MICROSCOPIC
Bilirubin Urine: NEGATIVE
GLUCOSE, UA: NEGATIVE mg/dL
KETONES UR: NEGATIVE mg/dL
NITRITE: NEGATIVE
PH: 6 (ref 5.0–8.0)
Protein, ur: NEGATIVE mg/dL
Specific Gravity, Urine: 1.013 (ref 1.005–1.030)

## 2016-03-19 NOTE — MAU Note (Signed)
Patient states that she experienced some vaginal spotting today when she wiped. Patient denies any pain. Fetus active.

## 2016-03-19 NOTE — MAU Provider Note (Signed)
Patient April Rodgers is a 31 year old G4P1011 at 31 weeks and 4 days with complaints of spotting this afternoon when she wiped. The color was red. She denies recent intercourse. She has had a few Deberah PeltonBraxton Hicks lately but denies contractions, leaking of fluid. She feels positive fetal movements.  History     CSN: [id number]  Arrival date and time: 03/19/16 1809   None     Chief Complaint  Patient presents with  . Vaginal Bleeding   Vaginal Bleeding  The patient's primary symptoms include vaginal bleeding. The patient's pertinent negatives include no genital itching, genital lesions, genital odor, genital rash, missed menses, pelvic pain or vaginal discharge. This is a new problem. The current episode started today. The problem occurs rarely. The problem has been unchanged. The patient is experiencing no pain. She is pregnant. Pertinent negatives include no abdominal pain, anorexia, back pain, chills, constipation, diarrhea, dysuria, fever, flank pain, frequency, headaches, hematuria, joint swelling, nausea, painful intercourse, rash, sore throat or urgency.    OB History    Gravida Para Term Preterm AB Living   4 2 1  0 1 1   SAB TAB Ectopic Multiple Live Births   0 1 0 1 1      Past Medical History:  Diagnosis Date  . Anemia   . Asherman's syndrome   . 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 Heart murmur   . 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 History of chorioamnionitis    03-10-2015  . History of fetal loss    03-10-2015  non-viable fetal demise twins at 2352w5d due to chorioamnionitis infection with PPROM  . Hydrosalpinx    RIGHT  . Migraine   . Normal exercise tolerance test results    03-22-2006  . Obstruction of fallopian tube    bilateral occlusion    Past Surgical History:  Procedure Laterality Date  . DILATION AND CURETTAGE OF UTERUS  2006  . HYSTEROSCOPY W/D&C N/A  06/28/2015   Procedure: SUCTION DILATATION AND CURETTAGE [name]/HYSTEROSCOPY, ;  Surgeon: Fermin Schwabamer Yalcinkaya, MD;  [facility];  Service: Gynecology;  Laterality: N/A;  . LAPAROSCOPY Left 06/28/2015   Procedure: LAPAROSCOPY LYSIS OF ADHESIONS, RIGHT SALPINGECTOMY WITH NOVY CATHERIZATION OF LEFT TUBE,EXCISION OF ENDOMETROSIS;  Surgeon: Fermin Schwabamer Yalcinkaya, MD;  Location: [facility];  Service: Gynecology;  Laterality: Left;  . TONSILLECTOMY  2012  . WISDOM TOOTH EXTRACTION  2016    Family History  Problem Relation Age of Onset  . Anemia Mother     Social History  Substance Use Topics  . Smoking status: Never Smoker  . Smokeless tobacco: Never Used  . Alcohol use No    Allergies:  Allergies  Allergen Reactions  . Penicillins Hives and Rash    Has patient had a PCN reaction causing immediate rash, facial/tongue/throat swelling, SOB or lightheadedness with hypotension: Yes Has patient had a PCN reaction causing severe rash involving mucus membranes or skin necrosis: No Has patient had a PCN reaction that required hospitalization No Has patient had a PCN reaction occurring within the last 10 years: No If all of the above answers are "NO", then may proceed with Cephalosporin use.     Prescriptions Prior to Admission  Medication Sig Dispense Refill Last Dose  . doxylamine, Sleep, (UNISOM) 25 MG tablet Take 25 mg by mouth at bedtime  as needed for sleep.   Past Week at Unknown time  . ibuprofen (ADVIL,MOTRIN) 600 MG tablet Take 1 tablet (600 mg total) by mouth every 6 (six) hours as needed. (Patient taking differently: Take 600 mg by mouth every 6 (six) hours as needed for moderate pain. ) 30 tablet 0 Past Week at Unknown time  . ketotifen (ALLERGY EYE DROPS) 0.025 % ophthalmic solution Place 1 drop into both eyes 2 (two) times daily as needed (for allergies or dryness).   Unknown at Unknown time  . Multiple Vitamin (MULTIVITAMIN) tablet Take 1 tablet by mouth  daily.   Past Week at Unknown time  . ondansetron (ZOFRAN) 4 MG tablet Take 1 tablet (4 mg total) by mouth every 8 (eight) hours as needed for nausea or vomiting. 20 tablet 0   . oxyCODONE-acetaminophen (PERCOCET) 7.5-325 MG tablet Take 1 tablet by mouth every 4 (four) hours as needed. 20 tablet 0     Review of Systems  Constitutional: Negative for chills and fever.  HENT: Negative for sore throat.   Eyes: Negative.   Respiratory: Negative.   Cardiovascular: Negative.   Gastrointestinal: Negative for abdominal pain, anorexia, constipation, diarrhea and nausea.  Endocrine: Negative.   Genitourinary: Positive for vaginal bleeding. Negative for dysuria, flank pain, frequency, hematuria, missed menses, pelvic pain, urgency and vaginal discharge.  Musculoskeletal: Negative for back pain.  Skin: Negative for rash.  Allergic/Immunologic: Negative.   Neurological: Negative.  Negative for headaches.  Hematological: Negative.   Psychiatric/Behavioral: Negative.    Physical Exam   Blood pressure 105/61, pulse 86, resp. rate 18, last menstrual period 05/25/2015.  Physical Exam  Constitutional: She is oriented to person, place, and time. She appears well-developed and well-nourished.  HENT:  Head: Normocephalic.  Eyes: Pupils are equal, round, and reactive to light.  Neck: Normal range of motion.  Respiratory: Effort normal. No respiratory distress. She has no wheezes.  GI: Soft. She exhibits no distension and no mass. There is no tenderness. There is no rebound and no guarding.  Genitourinary: Vaginal discharge found.  Genitourinary Comments: NEFG; no lesions on vaginal walls. Yellowish-discharge extruding from the os with no odor. The discharge does not adhere to vaginal walls. No bleeding during the speculum exam, no CMT and no suprapubic tenderness. Cervix is long, closed and thick.   Musculoskeletal: Normal range of motion.  Neurological: She is alert and oriented to person, place, and  time.  Skin: Skin is warm and dry.    MAU Course  Procedures  MDM -wet prep -GC CT sent -UA: negative for dehydration -NST: 140 BPM, present accel, moderate variability, negative decels. Uterine irritability.  -US shows no evidence of abruption or previa; AFI WNL Assessment and Plan   1. Vaginal spotting   2. [redacted] weeks gestation of pregnancy   3. Vaginal bleeding in pregnancy, third trimester    2. Patient to be discharged home with precautions to return to MAU if she develops any more bleeding, or has leaking of fluid or decreased fetal movements. Patient verbalized understanding.  3. Discussed case with Dr. Marcelle Overlie, who agreed with plan of care.   Charlesetta Garibaldi Kooistra CNM 03/19/2016, 6:59 PM

## 2016-03-19 NOTE — Discharge Instructions (Signed)
Vaginal Bleeding During Pregnancy, Third Trimester A small amount of bleeding (spotting) from the vagina is relatively common in pregnancy. Various things can cause bleeding or spotting in pregnancy. Sometimes the bleeding is normal and is not a problem. However, bleeding during the third trimester can also be a sign of something serious for the mother and the baby. Be sure to tell your health care provider about any vaginal bleeding right away. Some possible causes of vaginal bleeding during the third trimester include:  The placenta may be partially or completely covering the opening to the cervix (placenta previa).  The placenta may have separated from the uterus (abruption of the placenta).  There may be an infection or growth on the cervix.  You may be starting labor, called discharging of the mucus plug.  The placenta may grow into the muscle layer of the uterus (placenta accreta). Follow these instructions at home: Watch your condition for any changes. The following actions may help to lessen any discomfort you are feeling:  Follow your health care provider's instructions for limiting your activity. If your health care provider orders bed rest, you may need to stay in bed and only get up to use the bathroom. However, your health care provider may allow you to continue light activity.  If needed, make plans for someone to help with your regular activities and responsibilities while you are on bed rest.  Keep track of the number of pads you use each day, how often you change pads, and how soaked (saturated) they are. Write this down.  Do not use tampons. Do not douche.  Do not have sexual intercourse or orgasms until approved by your health care provider.  Follow your health care provider's advice about lifting, driving, and physical activities.  If you pass any tissue from your vagina, save the tissue so you can show it to your health care provider.  Only take over-the-counter or  prescription medicines as directed by your health care provider.  Do not take aspirin because it can make you bleed.  Keep all follow-up appointments as directed by your health care provider. Contact a health care provider if:  You have any vaginal bleeding during any part of your pregnancy.  You have cramps or labor pains.  You have a fever, not controlled by medicine. Get help right away if:  You have severe cramps or pain in your back or belly (abdomen).  You have chills.  You have a gush of fluid from the vagina.  You pass large clots or tissue from your vagina.  Your bleeding increases.  You feel light-headed or weak.  You pass out.  You feel less movement or no movement of the baby. This information is not intended to replace advice given to you by your health care provider. Make sure you discuss any questions you have with your health care provider. Document Released: 04/14/2002 Document Revised: 06/30/2015 Document Reviewed: 09/29/2012 Elsevier Interactive Patient Education  2017 Elsevier Inc.  

## 2016-03-20 LAB — GC/CHLAMYDIA PROBE AMP (~~LOC~~) NOT AT ARMC
Chlamydia: NEGATIVE
Neisseria Gonorrhea: NEGATIVE

## 2016-04-19 LAB — OB RESULTS CONSOLE GBS: GBS: NEGATIVE

## 2016-05-14 ENCOUNTER — Encounter (HOSPITAL_COMMUNITY): Payer: Self-pay | Admitting: *Deleted

## 2016-05-14 ENCOUNTER — Telehealth (HOSPITAL_COMMUNITY): Payer: Self-pay | Admitting: *Deleted

## 2016-05-14 NOTE — Telephone Encounter (Signed)
Preadmission screen  

## 2016-05-15 NOTE — H&P (Signed)
April Rodgers is a 31 y.o. female presenting for IOL at term. OB History    Gravida Para Term Preterm AB Living   0 1 1   SAB TAB Ectopic Multiple Live Births   0 1 0 1 1     Past Medical History:  Diagnosis Date  . Anemia   . Asherman's syndrome   . 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 Heart murmur   . 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 History of chorioamnionitis    03-10-2015  . History of fetal loss    03-10-2015  non-viable fetal demise twins at [redacted]w[redacted]d due to chorioamnionitis infection with PPROM  . Hx of varicella   . Hydrosalpinx    RIGHT  . Migraine   . Normal exercise tolerance test results    03-22-2006  . Obstruction of fallopian tube    bilateral occlusion  . Scoliosis    Past Surgical History:  Procedure Laterality Date  . DILATION AND CURETTAGE OF UTERUS  2006  . HYSTEROSCOPY W/D&C N/A 06/28/2015   Procedure: SUCTION DILATATION AND CURETTAGE [name], ;  Surgeon: Fermin Schwab, MD;  Location: [facility];  Service: Gynecology;  Laterality: N/A;  . LAPAROSCOPY Left 06/28/2015   Procedure: LAPAROSCOPY LYSIS OF ADHESIONS, RIGHT SALPINGECTOMY WITH NOVY CATHERIZATION OF LEFT TUBE,EXCISION OF ENDOMETROSIS;  Surgeon: Fermin Schwab, MD;  Location: [facility];  Service: Gynecology;  Laterality: Left;  . TONSILLECTOMY  2012  . WISDOM TOOTH EXTRACTION  2016   Family History: family history includes Anemia in her mother. Social History:  reports that she has never smoked. She has never used smokeless tobacco. She reports that she does not drink alcohol or use drugs.     Maternal Diabetes: No Genetic Screening: Normal Maternal Ultrasounds/Referrals: Normal Fetal Ultrasounds or other Referrals:  None Maternal Substance Abuse:  No Significant Maternal Medications:  None Significant Maternal Lab Results:  None Other  Comments:  None  Review of Systems  Eyes: Negative for blurred vision.  Gastrointestinal: Negative for abdominal pain.  Neurological: Negative for headaches.   History   Last menstrual period 05/25/2015. Exam Physical Exam  Cardiovascular: Normal rate.   Respiratory: Effort normal.  GI: Soft.  Neurological: She has normal reflexes.    Prenatal labs: ABO, Rh: O/Positive/-- (09/21 0000) Antibody: Negative (09/21 0000) Rubella: Immune (09/21 0000) RPR: Nonreactive (09/21 0000)  HBsAg: Negative (09/21 0000)  HIV: Non-reactive (09/21 0000)  GBS: Negative (03/15 0000)   Assessment/Plan: 31 yo G4P1 @ 40 0/7 weeks for IOL   April Rodgers II,April Rodgers E 05/15/2016, 5:16 PM

## 2016-05-17 ENCOUNTER — Inpatient Hospital Stay (HOSPITAL_COMMUNITY): Payer: Managed Care, Other (non HMO) | Admitting: Anesthesiology

## 2016-05-17 ENCOUNTER — Inpatient Hospital Stay (HOSPITAL_COMMUNITY)
Admission: RE | Admit: 2016-05-17 | Discharge: 2016-05-19 | DRG: 775 | Disposition: A | Discharge status: Home / Self Care | Payer: Managed Care, Other (non HMO) | Source: Ambulatory Visit | Attending: Obstetrics and Gynecology | Admitting: Obstetrics and Gynecology

## 2016-05-17 ENCOUNTER — Encounter (HOSPITAL_COMMUNITY): Payer: Self-pay

## 2016-05-17 DIAGNOSIS — M419 Scoliosis, unspecified: Secondary | ICD-10-CM | POA: Diagnosis present

## 2016-05-17 DIAGNOSIS — Z3A40 40 weeks gestation of pregnancy: Secondary | ICD-10-CM

## 2016-05-17 DIAGNOSIS — Z3490 Encounter for supervision of normal pregnancy, unspecified, unspecified trimester: Secondary | ICD-10-CM

## 2016-05-17 DIAGNOSIS — Z3493 Encounter for supervision of normal pregnancy, unspecified, third trimester: Secondary | ICD-10-CM | POA: Diagnosis present

## 2016-05-17 LAB — CBC
HEMATOCRIT: 30.8 % — AB (ref 36.0–46.0)
HEMOGLOBIN: 10.2 g/dL — AB (ref 12.0–15.0)
MCH: 26.6 pg (ref 26.0–34.0)
MCHC: 33.1 g/dL (ref 30.0–36.0)
MCV: 80.2 fL (ref 78.0–100.0)
PLATELETS: 205 10*3/uL (ref 150–400)
RBC: 3.84 MIL/uL — AB (ref 3.87–5.11)
RDW: 20.5 % — ABNORMAL HIGH (ref 11.5–15.5)
WBC: 7.9 10*3/uL (ref 4.0–10.5)

## 2016-05-17 LAB — TYPE AND SCREEN
ABO/RH(D): O POS
Antibody Screen: NEGATIVE

## 2016-05-17 LAB — RPR: RPR Ser Ql: NONREACTIVE

## 2016-05-17 MED ORDER — FENTANYL 2.5 MCG/ML BUPIVACAINE 1/10 % EPIDURAL INFUSION (WH - ANES)
14.0000 mL/h | INTRAMUSCULAR | Status: DC | PRN
  Administered 2016-05-17: 14 mL/h via EPIDURAL
  Filled 2016-05-17: qty 100

## 2016-05-17 MED ORDER — EPHEDRINE 5 MG/ML INJ: 10.0000 mg | INTRAVENOUS | Status: DC | PRN

## 2016-05-17 MED ORDER — LACTATED RINGERS IV SOLN: 500.0000 mL | INTRAVENOUS | Status: DC | PRN

## 2016-05-17 MED ORDER — TERBUTALINE SULFATE 1 MG/ML IJ SOLN
0.2500 mg | Freq: Once | INTRAMUSCULAR | Status: DC | PRN
  Filled 2016-05-17: qty 1

## 2016-05-17 MED ORDER — ONDANSETRON HCL 4 MG/2ML IJ SOLN: 4.0000 mg | Freq: Four times a day (QID) | INTRAMUSCULAR | Status: DC | PRN

## 2016-05-17 MED ORDER — LIDOCAINE HCL (PF) 1 % IJ SOLN
INTRAMUSCULAR | Status: DC | PRN
  Administered 2016-05-17 (×2): 6 mL via EPIDURAL

## 2016-05-17 MED ORDER — OXYCODONE HCL 5 MG PO TABS: 10.0000 mg | ORAL_TABLET | ORAL | Status: DC | PRN

## 2016-05-17 MED ORDER — SENNOSIDES-DOCUSATE SODIUM 8.6-50 MG PO TABS
2.0000 | ORAL_TABLET | ORAL | Status: DC
  Administered 2016-05-17 – 2016-05-19 (×2): 2 via ORAL
  Filled 2016-05-17 (×2): qty 2

## 2016-05-17 MED ORDER — OXYTOCIN 40 UNITS IN LACTATED RINGERS INFUSION - SIMPLE MED
2.5000 [IU]/h | INTRAVENOUS | Status: DC
  Filled 2016-05-17: qty 1000

## 2016-05-17 MED ORDER — ZOLPIDEM TARTRATE 5 MG PO TABS: 5.0000 mg | ORAL_TABLET | Freq: Every evening | ORAL | Status: DC | PRN

## 2016-05-17 MED ORDER — PHENYLEPHRINE 40 MCG/ML (10ML) SYRINGE FOR IV PUSH (FOR BLOOD PRESSURE SUPPORT)
80.0000 ug | PREFILLED_SYRINGE | INTRAVENOUS | Status: DC | PRN
  Filled 2016-05-17: qty 10
  Filled 2016-05-17: qty 5

## 2016-05-17 MED ORDER — OXYTOCIN BOLUS FROM INFUSION
500.0000 mL | Freq: Once | INTRAVENOUS | Status: AC
  Administered 2016-05-17: 500 mL via INTRAVENOUS

## 2016-05-17 MED ORDER — WITCH HAZEL-GLYCERIN EX PADS: 1.0000 "application " | MEDICATED_PAD | CUTANEOUS | Status: DC | PRN

## 2016-05-17 MED ORDER — BENZOCAINE-MENTHOL 20-0.5 % EX AERO
1.0000 "application " | INHALATION_SPRAY | CUTANEOUS | Status: DC | PRN
  Filled 2016-05-17: qty 56

## 2016-05-17 MED ORDER — ONDANSETRON HCL 4 MG/2ML IJ SOLN: 4.0000 mg | INTRAMUSCULAR | Status: DC | PRN

## 2016-05-17 MED ORDER — PHENYLEPHRINE 40 MCG/ML (10ML) SYRINGE FOR IV PUSH (FOR BLOOD PRESSURE SUPPORT): 80.0000 ug | PREFILLED_SYRINGE | INTRAVENOUS | Status: DC | PRN

## 2016-05-17 MED ORDER — TETANUS-DIPHTH-ACELL PERTUSSIS 5-2.5-18.5 LF-MCG/0.5 IM SUSP: 0.5000 mL | Freq: Once | INTRAMUSCULAR | Status: DC

## 2016-05-17 MED ORDER — ACETAMINOPHEN 325 MG PO TABS
650.0000 mg | ORAL_TABLET | ORAL | Status: DC | PRN
  Administered 2016-05-17 – 2016-05-19 (×3): 650 mg via ORAL
  Filled 2016-05-17 (×3): qty 2

## 2016-05-17 MED ORDER — DIPHENHYDRAMINE HCL 50 MG/ML IJ SOLN: 12.5000 mg | INTRAMUSCULAR | Status: DC | PRN

## 2016-05-17 MED ORDER — BUTORPHANOL TARTRATE 1 MG/ML IJ SOLN: 1.0000 mg | INTRAMUSCULAR | Status: DC | PRN

## 2016-05-17 MED ORDER — LACTATED RINGERS IV SOLN: 500.0000 mL | Freq: Once | INTRAVENOUS | Status: DC

## 2016-05-17 MED ORDER — LIDOCAINE HCL (PF) 1 % IJ SOLN
30.0000 mL | INTRAMUSCULAR | Status: DC | PRN
  Filled 2016-05-17: qty 30

## 2016-05-17 MED ORDER — IBUPROFEN 600 MG PO TABS
600.0000 mg | ORAL_TABLET | Freq: Four times a day (QID) | ORAL | Status: DC
  Administered 2016-05-17 – 2016-05-19 (×8): 600 mg via ORAL
  Filled 2016-05-17 (×8): qty 1

## 2016-05-17 MED ORDER — OXYCODONE-ACETAMINOPHEN 5-325 MG PO TABS: 1.0000 | ORAL_TABLET | ORAL | Status: DC | PRN

## 2016-05-17 MED ORDER — ACETAMINOPHEN 325 MG PO TABS: 650.0000 mg | ORAL_TABLET | ORAL | Status: DC | PRN

## 2016-05-17 MED ORDER — EPHEDRINE 5 MG/ML INJ
10.0000 mg | INTRAVENOUS | Status: DC | PRN
  Filled 2016-05-17: qty 2

## 2016-05-17 MED ORDER — LACTATED RINGERS IV SOLN
INTRAVENOUS | Status: DC
Start: 2016-05-17 — End: 2016-05-17
  Administered 2016-05-17 (×3): via INTRAVENOUS

## 2016-05-17 MED ORDER — OXYTOCIN 40 UNITS IN LACTATED RINGERS INFUSION - SIMPLE MED
1.0000 m[IU]/min | INTRAVENOUS | Status: DC
  Administered 2016-05-17: 2 m[IU]/min via INTRAVENOUS

## 2016-05-17 MED ORDER — METHYLERGONOVINE MALEATE 0.2 MG/ML IJ SOLN
INTRAMUSCULAR | Status: AC
  Filled 2016-05-17: qty 1

## 2016-05-17 MED ORDER — MISOPROSTOL 25 MCG QUARTER TABLET
25.0000 ug | ORAL_TABLET | ORAL | Status: DC | PRN
  Administered 2016-05-17 (×2): 25 ug via VAGINAL
  Filled 2016-05-17 (×3): qty 1

## 2016-05-17 MED ORDER — SIMETHICONE 80 MG PO CHEW: 80.0000 mg | CHEWABLE_TABLET | ORAL | Status: DC | PRN

## 2016-05-17 MED ORDER — COCONUT OIL OIL
1.0000 "application " | TOPICAL_OIL | Status: DC | PRN
  Administered 2016-05-18: 1 via TOPICAL
  Filled 2016-05-17: qty 120

## 2016-05-17 MED ORDER — DIPHENHYDRAMINE HCL 25 MG PO CAPS
25.0000 mg | ORAL_CAPSULE | Freq: Four times a day (QID) | ORAL | Status: DC | PRN
  Filled 2016-05-17: qty 1

## 2016-05-17 MED ORDER — FLEET ENEMA 7-19 GM/118ML RE ENEM: 1.0000 | ENEMA | RECTAL | Status: DC | PRN

## 2016-05-17 MED ORDER — DIPHENHYDRAMINE HCL 25 MG PO CAPS
25.0000 mg | ORAL_CAPSULE | Freq: Once | ORAL | Status: AC
  Administered 2016-05-17: 25 mg via ORAL
  Filled 2016-05-17: qty 1

## 2016-05-17 MED ORDER — OXYCODONE-ACETAMINOPHEN 5-325 MG PO TABS: 2.0000 | ORAL_TABLET | ORAL | Status: DC | PRN

## 2016-05-17 MED ORDER — PHENYLEPHRINE 40 MCG/ML (10ML) SYRINGE FOR IV PUSH (FOR BLOOD PRESSURE SUPPORT)
80.0000 ug | PREFILLED_SYRINGE | INTRAVENOUS | Status: DC | PRN
  Filled 2016-05-17: qty 5

## 2016-05-17 MED ORDER — LACTATED RINGERS IV SOLN
500.0000 mL | Freq: Once | INTRAVENOUS | Status: AC
  Administered 2016-05-17: 500 mL via INTRAVENOUS

## 2016-05-17 MED ORDER — PRENATAL MULTIVITAMIN CH
1.0000 | ORAL_TABLET | Freq: Every day | ORAL | Status: DC
  Administered 2016-05-18 – 2016-05-19 (×2): 1 via ORAL
  Filled 2016-05-17 (×2): qty 1

## 2016-05-17 MED ORDER — OXYCODONE HCL 5 MG PO TABS: 5.0000 mg | ORAL_TABLET | ORAL | Status: DC | PRN

## 2016-05-17 MED ORDER — SOD CITRATE-CITRIC ACID 500-334 MG/5ML PO SOLN: 30.0000 mL | ORAL | Status: DC | PRN

## 2016-05-17 MED ORDER — ONDANSETRON HCL 4 MG PO TABS: 4.0000 mg | ORAL_TABLET | ORAL | Status: DC | PRN

## 2016-05-17 MED ORDER — DIBUCAINE 1 % RE OINT: 1.0000 "application " | TOPICAL_OINTMENT | RECTAL | Status: DC | PRN

## 2016-05-17 NOTE — Progress Notes (Signed)
No changes to H&P per patient history S/P cytotec x 2 Lungs CTA Cor RRR Abd no epigastric tenderness Cx 2/50/-3/vtx FHT cat one UCs irregular  A/P: D/W patient IOL         Begin pitocin-D/W patient

## 2016-05-17 NOTE — Anesthesia Procedure Notes (Signed)
Epidural Patient location during procedure: OB Start time: 05/17/2016 11:04 AM End time: 05/17/2016 11:28 AM  Staffing Anesthesiologist: Jairo Ben Performed: anesthesiologist   Preanesthetic Checklist Completed: patient identified, surgical consent, pre-op evaluation, timeout performed, IV checked, risks and benefits discussed and monitors and equipment checked  Epidural Patient position: sitting Prep: site prepped and draped and DuraPrep Patient monitoring: blood pressure, continuous pulse ox and heart rate Approach: midline Location: L3-L4 Injection technique: LOR air  Needle:  Needle type: Tuohy  Needle gauge: 17 G Needle length: 9 cm Needle insertion depth: 6 cm Catheter type: closed end flexible Catheter size: 19 Gauge Catheter at skin depth: 12 cm Test dose: negative (1% lidocaine)  Assessment Events: blood not aspirated, injection not painful, no injection resistance, negative IV test and no paresthesia  Additional Notes Pt identified in Labor room.  Monitors applied. Working IV access confirmed. Sterile prep, drape lumbar spine.  1% lido local L 3,4.  #17ga Touhy LOR air at 6 cm L 3,4, cath in easily to 12 cm skin. Test dose OK, cath dosed and infusion begun.  Patient asymptomatic, VSS, no heme aspirated, tolerated well.  Sandford Craze, MDReason for block:procedure for pain

## 2016-05-17 NOTE — Progress Notes (Signed)
Bedside U/S vtx FHT cat one UCs q2-4 min Cx 4/90/-2 AROM clear IUPC placed

## 2016-05-17 NOTE — Lactation Note (Signed)
This note was copied from a baby's chart. Lactation Consultation Note  P2, Baby 2 hours old. Mother is breast and formula. Per mom baby breastfed after birth for 45 min and mother was taught hand expression. She states she bf for a few weeks with first child. Mother interested in pumping and bottle feeding and breastfeeding. Discussed requesting pump set up when mother is ready to start pumping. Provided education regarding volume when pumping the first few times. Offered to assist w/ latching because baby was cueing but mother states she wants to give baby bottle of formula. Mom made aware of O/P services, breastfeeding support groups, community resources, and our phone # for post-discharge questions.  Suggest mother call if she would like assistance w/ breastfeeding.  Patient Name: April Rodgers [id number] Date: 05/17/2016 Reason for consult: Initial assessment   Maternal Data Has patient been taught Hand Expression?: Yes Does the patient have breastfeeding experience prior to this delivery?: Yes  Feeding    LATCH Score/Interventions                      Lactation Tools Discussed/Used     Consult Status Consult Status: Follow-up Date: 05/18/16 Follow-up type: In-patient    Dahlia Byes [facility] 05/17/2016, 7:13 PM

## 2016-05-17 NOTE — Anesthesia Preprocedure Evaluation (Signed)
Anesthesia Evaluation  Patient identified by MRN, date of birth, ID band Patient awake    Reviewed: Allergy & Precautions, NPO status , Patient's Chart, lab work & pertinent test results  History of Anesthesia Complications Negative for: history of anesthetic complications  Airway Mallampati: II   Neck ROM: Full    Dental  (+) Dental Advisory Given, Teeth Intact   Pulmonary neg pulmonary ROS,    breath sounds clear to auscultation       Cardiovascular + Valvular Problems/Murmurs MVP  Rhythm:Regular Rate:Normal     Neuro/Psych negative neurological ROS     GI/Hepatic negative GI ROS, Neg liver ROS,   Endo/Other  negative endocrine ROS  Renal/GU negative Renal ROS     Musculoskeletal   Abdominal   Peds  Hematology plt 205k   Anesthesia Other Findings   Reproductive/Obstetrics (+) Pregnancy                             Anesthesia Physical Anesthesia Plan  ASA: II  Anesthesia Plan: Epidural   Post-op Pain Management:    Induction:   Airway Management Planned: Natural Airway  Additional Equipment:   Intra-op Plan:   Post-operative Plan:   Informed Consent: I have reviewed the patients History and Physical, chart, labs and discussed the procedure including the risks, benefits and alternatives for the proposed anesthesia with the patient or authorized representative who has indicated his/her understanding and acceptance.   Dental advisory given  Plan Discussed with:   Anesthesia Plan Comments: (Patient identified. Risks/Benefits/Options discussed with patient including but not limited to bleeding, infection, nerve damage, paralysis, failed block, incomplete pain control, headache, blood pressure changes, nausea, vomiting, reactions to medication both or allergic, itching and postpartum back pain. Confirmed with bedside nurse the patient's most recent platelet count. Confirmed with  patient that they are not currently taking any anticoagulation, have any bleeding history or any family history of bleeding disorders. Patient expressed understanding and wished to proceed. All questions were answered. )        Anesthesia Quick Evaluation

## 2016-05-17 NOTE — Progress Notes (Signed)
Delivery Note At 5:03 PM a viable female was delivered via Vaginal, Spontaneous Delivery (Presentation:ROA ;  ).  APGAR: 9, 9; weight  .   Placenta status:intact , .  Cord: 3 vessels with the following complications: .  Cord pH: not done  Anesthesia:  epidural Episiotomy:  none Lacerations:  Second degree ML Lac repaired Suture Repair: 3.0 vicryl rapide Est. Blood Loss (mL):  400  Mom to postpartum.  Baby to Couplet care / Skin to Skin.  Melvyn Hommes II,Jonetta Dagley E 05/17/2016, 5:19 PM

## 2016-05-17 NOTE — Anesthesia Pain Management Evaluation Note (Signed)
  CRNA Pain Management Visit Note  Patient: April Rodgers, 31 y.o., female  "Hello I am a member of the anesthesia team at [facility]. We have an anesthesia team available at all times to provide care throughout the hospital, including epidural management and anesthesia for C-section. I don't know your plan for the delivery whether it a natural birth, water birth, IV sedation, nitrous supplementation, doula or epidural, but we want to meet your pain goals."   1.Was your pain managed to your expectations on prior hospitalizations?   Yes   2.What is your expectation for pain management during this hospitalization?     Epidural  3.How can we help you reach that goal? epidural  Record the patient's initial score and the patient's pain goal.   Pain: 7  Pain Goal: 4 [facility] wants you to be able to say your pain was always managed very well.  Madison Hickman 05/17/2016

## 2016-05-18 LAB — CBC
HCT: 28.8 % — ABNORMAL LOW (ref 36.0–46.0)
Hemoglobin: 9.5 g/dL — ABNORMAL LOW (ref 12.0–15.0)
MCH: 26.5 pg (ref 26.0–34.0)
MCHC: 33 g/dL (ref 30.0–36.0)
MCV: 80.2 fL (ref 78.0–100.0)
PLATELETS: 175 10*3/uL (ref 150–400)
RBC: 3.59 MIL/uL — AB (ref 3.87–5.11)
RDW: 20 % — AB (ref 11.5–15.5)
WBC: 9.2 10*3/uL (ref 4.0–10.5)

## 2016-05-18 NOTE — Progress Notes (Signed)
Post Partum Day 1 Subjective: no complaints, up ad lib, voiding, tolerating PO and + flatus  Objective: Blood pressure 113/60, pulse 70, temperature 98.1 F (36.7 C), temperature source Oral, resp. rate 18, height  (1.753 m), weight 175 lb (79.4 kg), last menstrual period 05/25/2015, SpO2 100 %, unknown if currently breastfeeding.  Physical Exam:  General: alert and cooperative Lochia: appropriate Uterine Fundus: firm Incision: healing well DVT Evaluation: No evidence of DVT seen on physical exam. Negative Homan's sign. No cords or calf tenderness. Calf/Ankle edema is present.   Recent Labs  05/17/16 0058 05/18/16 0552  HGB 10.2* 9.5*  HCT 30.8* 28.8*    Assessment/Plan: Plan for discharge tomorrow, Breastfeeding and Circumcision prior to discharge   LOS: 1 day   Nur Krasinski G 05/18/2016, 8:03 AM

## 2016-05-18 NOTE — Lactation Note (Signed)
This note was copied from a baby's chart. Lactation Consultation Note  Patient Name: April Rodgers [id number] Date: 05/18/2016 Reason for consult: (P) Follow-up assessment;Breast/nipple pain;Other (Comment)  Per mom baby is in the nursery for circ and hearing screen .  Per mom breastfeeding nipples and bottle .  LC assessed breast tissue - with moms permission, LC assessed breast tissue, no breakdown noted ,reviewed hand expressing , tiny drops noted.  LC recommended prior to latching breast massage, hand express, pre -pump to make the nipple and areola more elastic for a deeper latch, and latch with firm pillow support.  LC provided extra pillows, hand pump , mom already has comfort gels , coconut oil .  LC recommended to mom not to use them together.  LC also recommended since the bay has been breast and formula she may need to give him a appetizer of formula prior to latching if really hungry.  LC recommended to call with feeding cues .  Per mom will have a DEBP at home.    Maternal Data Has patient been taught Hand Expression?: Yes  Feeding    LATCH Score/Interventions                      Lactation Tools Discussed/Used Tools: Pump;Comfort gels Breast pump type: Manual Pump Review: Setup, frequency, and cleaning Initiated by:: MAI  Date initiated:: 05/18/16   Consult Status Consult Status: Follow-up Date: 05/19/16 Follow-up type: In-patient    Matilde Sprang Soley Harriss 05/18/2016, 6:33 PM

## 2016-05-18 NOTE — Plan of Care (Signed)
Problem: Nutritional: Goal: Mothers verbalization of comfort with breastfeeding process will improve Outcome: Progressing Pt breastfeeding more often with formula not given much

## 2016-05-18 NOTE — Anesthesia Postprocedure Evaluation (Addendum)
Anesthesia Post Note  Patient: April Rodgers  Procedure(s) Performed: * No procedures listed *  Patient location during evaluation: Mother Baby Anesthesia Type: Epidural Level of consciousness: awake and alert Pain management: pain level controlled Vital Signs Assessment: post-procedure vital signs reviewed and stable Respiratory status: spontaneous breathing, nonlabored ventilation and respiratory function stable Cardiovascular status: stable Postop Assessment: no headache, no backache, epidural receding and patient able to bend at knees Anesthetic complications: no        Last Vitals:  Vitals:   05/17/16 2354 05/18/16 0500  BP: 110/72 113/60  Pulse: 78 70  Resp: 16 18  Temp: 36.6 C 36.7 C    Last Pain:  Vitals:   05/18/16 0531  TempSrc:   PainSc: 3    Pain Goal:                 Rica Records

## 2016-05-19 MED ORDER — FERROUS SULFATE 325 (65 FE) MG PO TBEC: 325.0000 mg | DELAYED_RELEASE_TABLET | Freq: Two times a day (BID) | ORAL | 2 refills | Status: DC

## 2016-05-19 MED ORDER — DOCUSATE SODIUM 100 MG PO CAPS: 100.0000 mg | ORAL_CAPSULE | Freq: Two times a day (BID) | ORAL | 2 refills | Status: DC

## 2016-05-19 MED ORDER — IBUPROFEN 600 MG PO TABS: 600.0000 mg | ORAL_TABLET | Freq: Four times a day (QID) | ORAL | 0 refills | Status: DC | PRN

## 2016-05-19 NOTE — Lactation Note (Addendum)
This note was copied from a baby's chart. Lactation Consultation Note Mom breast/formula feeding. Encouraged mom to BF before giving formula feeding. Reviewed supply and demand, I&O documenting after d/c home, monitoring baby output, and engorgement. Mom has spectra DEBP asked me to demonstrate use. Set up pump, fitted proper flange, started pump to mom. Mom wasn't interested in pumping at that time, just wanted set up demonstration.  Mom has coconut oil and comfort gels, encouraged not to use together.  Mom was on [facility], stated she had to go back and sign up again. Reviewed LC out pt. Services as well as community services.  Patient Name: April Rodgers ZOXWR'U Date: 05/19/2016 Reason for consult: Follow-up assessment   Maternal Data    Feeding Feeding Type: Breast Fed Nipple Type: Slow - flow  LATCH Score/Interventions Latch: Repeated attempts needed to sustain latch, nipple held in mouth throughout feeding, stimulation needed to elicit sucking reflex. Intervention(s): Adjust position  Audible Swallowing: A few with stimulation Intervention(s): Hand expression;Skin to skin  Type of Nipple: Everted at rest and after stimulation  Comfort (Breast/Nipple): Filling, red/small blisters or bruises, mild/mod discomfort  Problem noted: Mild/Moderate discomfort Interventions (Mild/moderate discomfort): Comfort gels;Hand massage;Hand expression  Hold (Positioning): Assistance needed to correctly position infant at breast and maintain latch.  LATCH Score: 6  Lactation Tools Discussed/Used Pump Review: Setup, frequency, and cleaning;Milk Storage Initiated by:: Peri Jefferson RN IBCLC Date initiated:: 05/19/16   Consult Status Consult Status: Complete Date: 05/19/16    Charyl Dancer 05/19/2016, 10:09 AM

## 2016-05-19 NOTE — Discharge Summary (Signed)
Obstetric Discharge Summary Reason for Admission: induction of labor Prenatal Procedures: none Intrapartum Procedures: spontaneous vaginal delivery Postpartum Procedures: none Complications-Operative and Postpartum: none Hemoglobin  Date Value Ref Range Status  05/18/2016 9.5 (L) 12.0 - 15.0 g/dL Final   HCT  Date Value Ref Range Status  05/18/2016 28.8 (L) 36.0 - 46.0 % Final    Physical Exam:  General: alert, cooperative and appears stated age 31: appropriate Uterine Fundus: firm Incision: healing well, no significant drainage, no dehiscence, no significant erythema DVT Evaluation: No evidence of DVT seen on physical exam. Negative Homan's sign. No cords or calf tenderness. No significant calf/ankle edema.  Discharge Diagnoses: Term Pregnancy-delivered  Discharge Information: Date: 05/19/2016 Activity: pelvic rest Diet: routine Medications: None, Ibuprofen, Colace and Iron Condition: stable Instructions: refer to practice specific booklet Discharge to: home   Newborn Data: Live born female  Birth Weight: 7 lb 5.6 oz (3335 g) APGAR: 9, 9  Home with mother.  April Rodgers 05/19/2016, 8:31 AM

## 2016-08-23 NOTE — Addendum Note (Signed)
Addendum  created 08/23/16 1733 by Jairo BenJackson, Sienna Stonehocker, MD   Sign clinical note

## 2016-10-10 IMAGING — RF DG HYSTEROGRAM
5 series · 5 of 5 positions shown · IV contrast (omnipaque)
Comparison: None.

CLINICAL DATA: Uterine synechiae

EXAM:
HYSTEROSALPINGOGRAM
TECHNIQUE: Following cleansing of the cervix and vagina with Betadine solution,
a hysterosalpingogram was performed using a 5-French
hysterosalpingogram catheter and Omnipaque 300 contrast. The patient
tolerated the examination without difficulty.

[Series 1: run · 1 of 1 slices shown (1 of 5)]
[im 1/1]
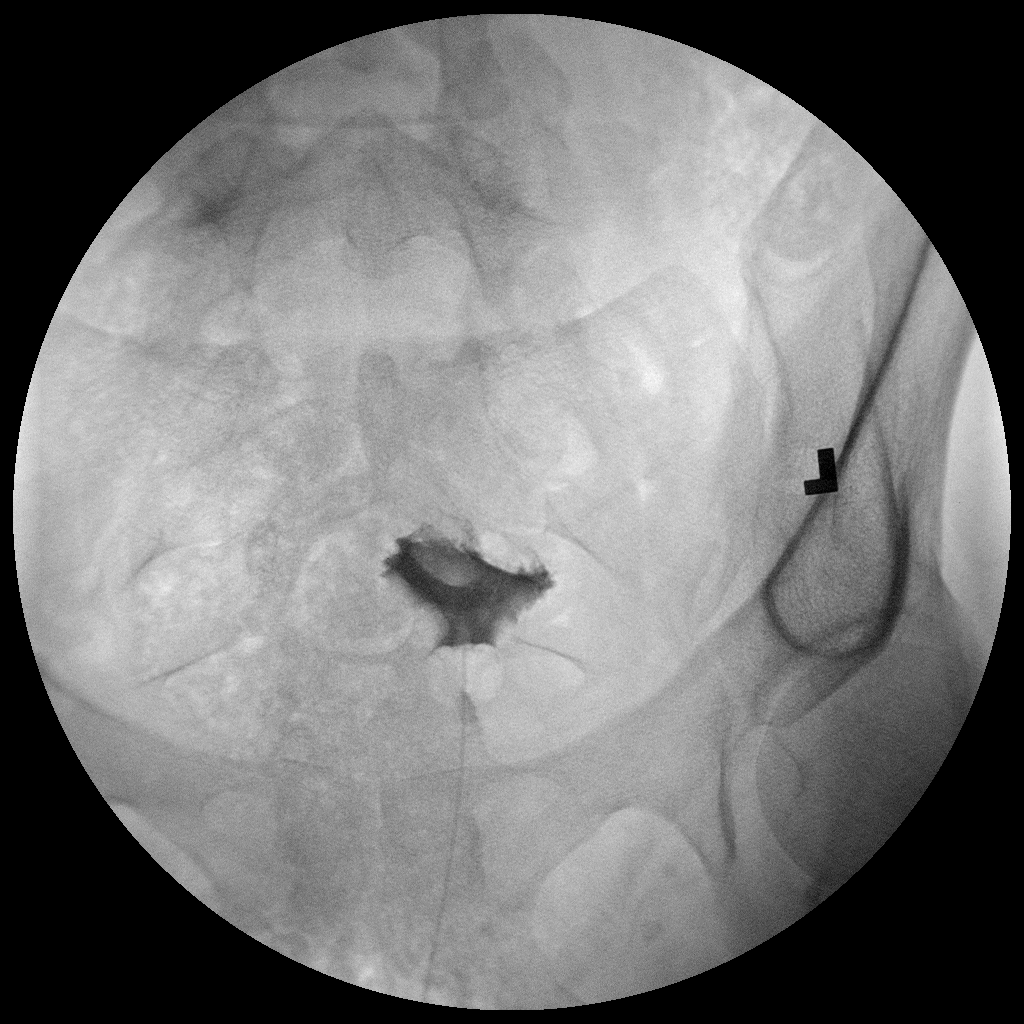

[Series 2: run · 1 of 1 slices shown (2 of 5)]
[im 1/1]
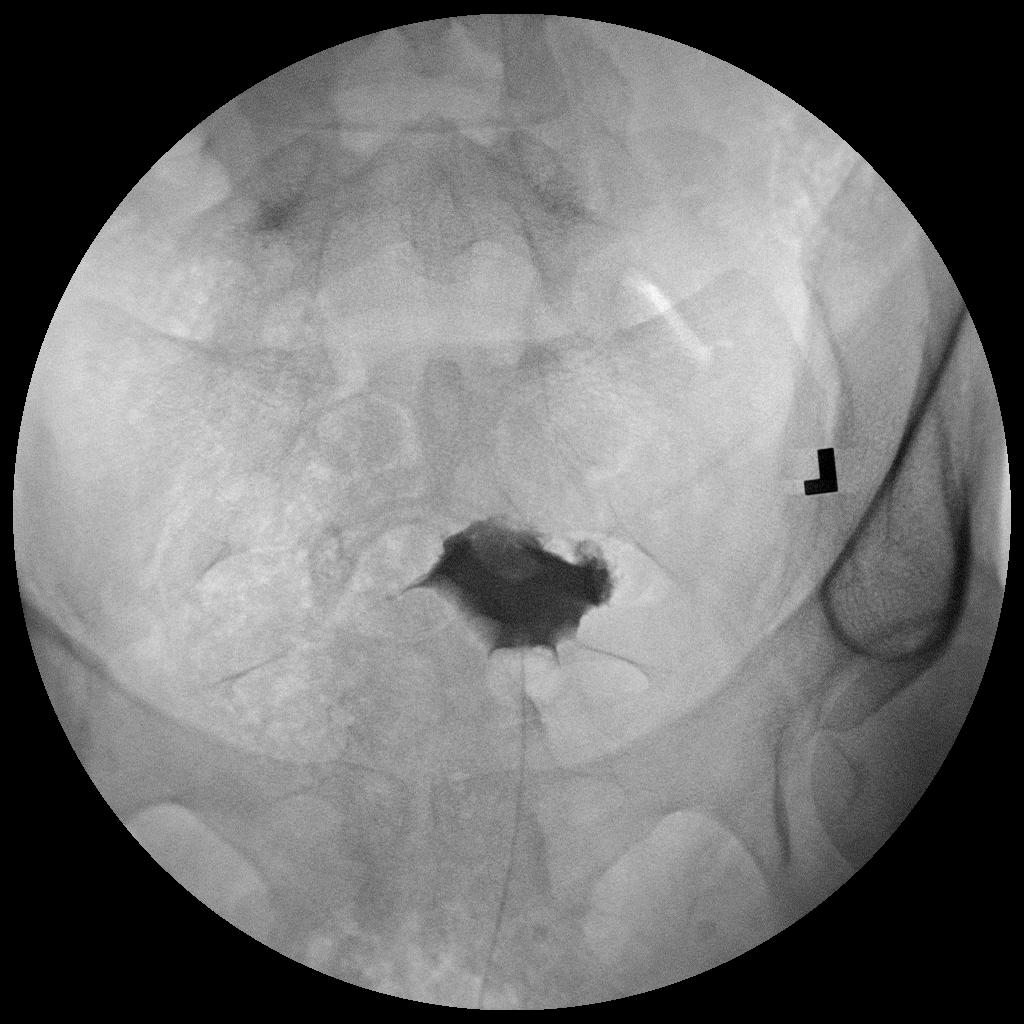

[Series 3: run · 1 of 1 slices shown (3 of 5)]
[im 1/1]
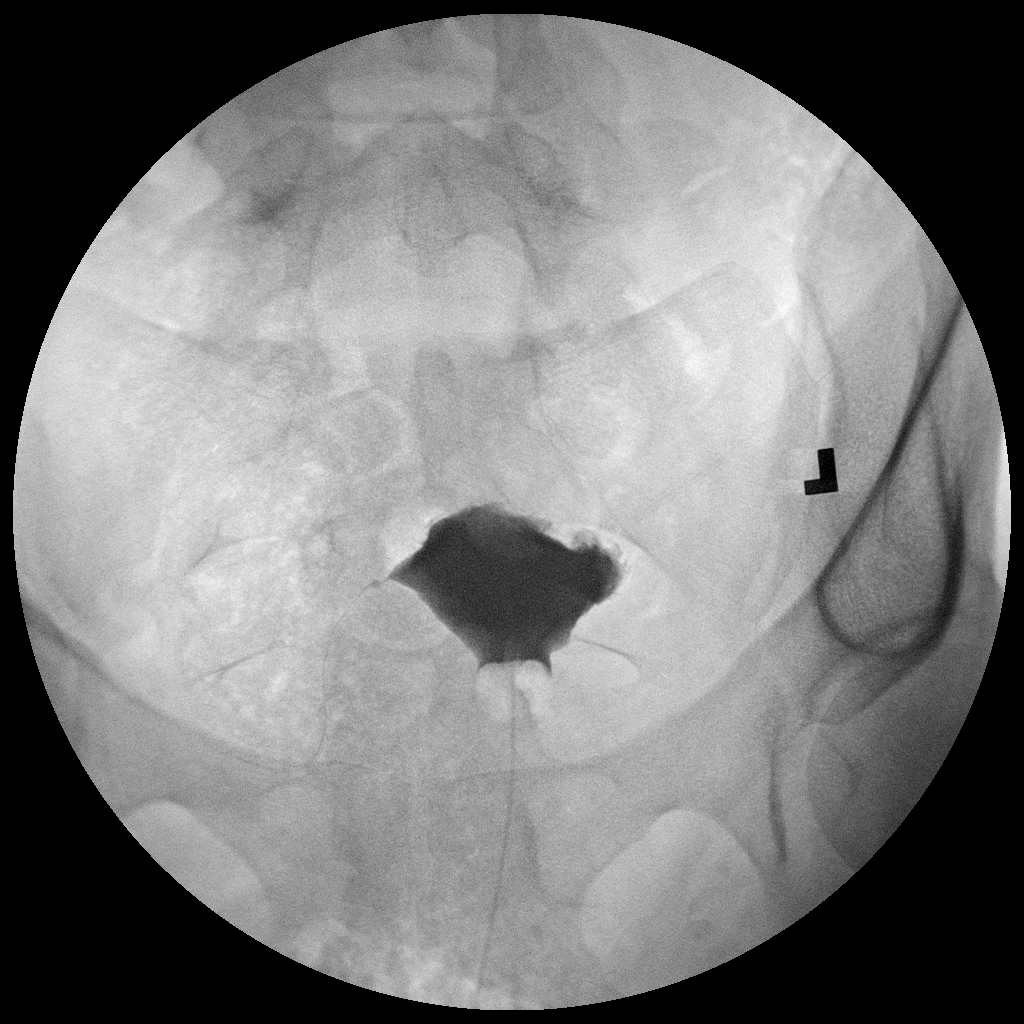

[Series 4: run · 1 of 1 slices shown (4 of 5)]
[im 1/1]
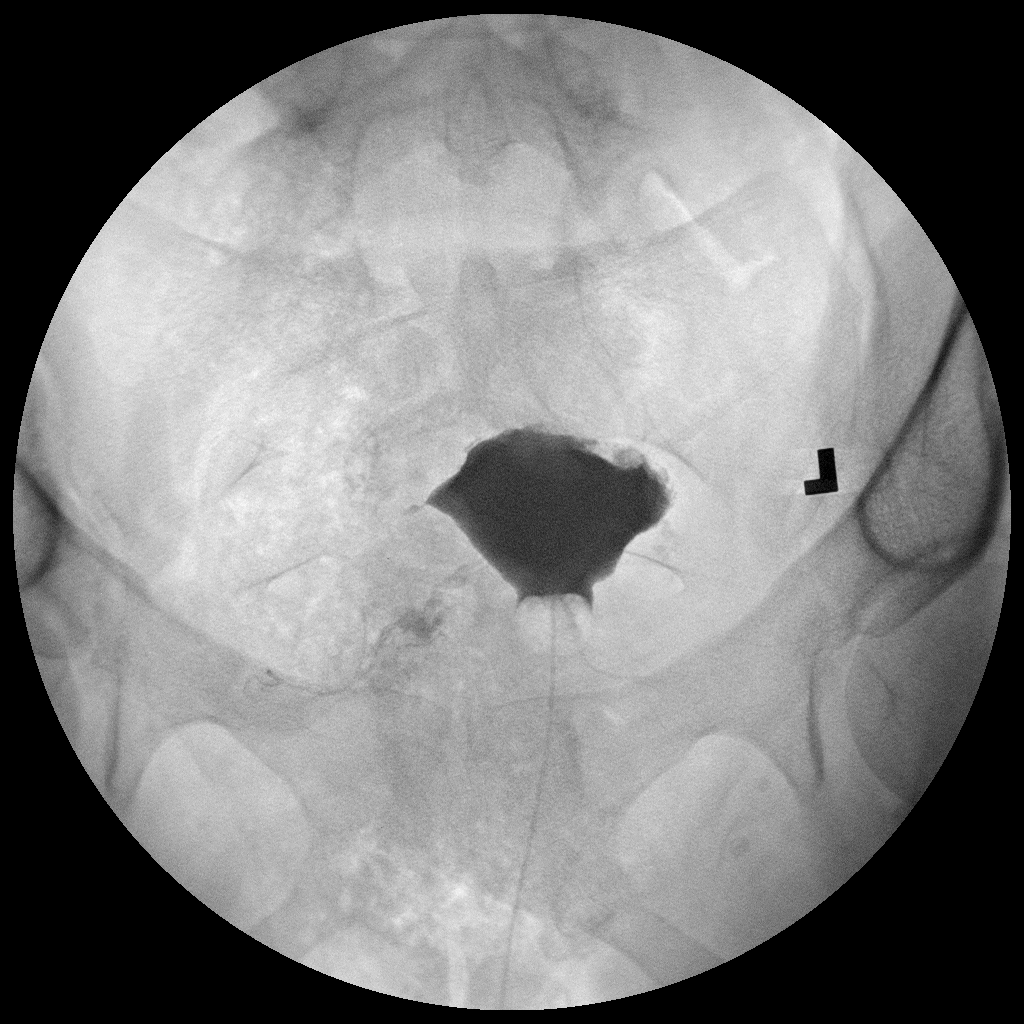

[Series 5: run · 1 of 1 slices shown (5 of 5)]
[im 1/1]
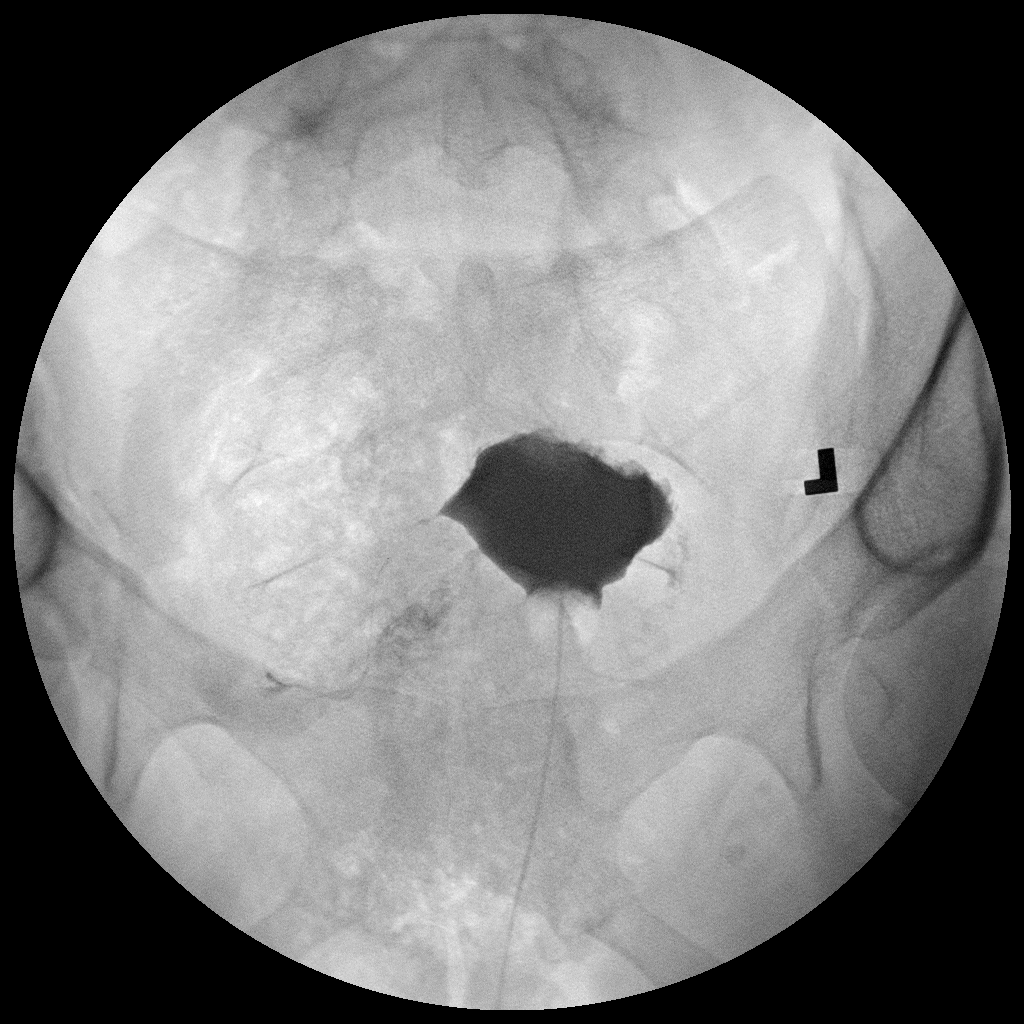

[5 of 5 positions shown; findings below may reference images not displayed]

FLUOROSCOPY TIME:  Radiation Exposure Index (as provided by the
fluoroscopic device):

If the device does not provide the exposure index:

Fluoroscopy Time:  42 seconds

Number of Acquired Images:  5
FINDINGS: Upon early filling of the uterine cavity, there is irregularity of
the endometrial contours and endometrial lining. Rounded filling
defect is noted along the fundus early on in filling which may
represent a fibroid.

There is partial filling of the right fallopian tube to the mid tube
level. Non filling of the left fallopian tube. No spillage could be
elicited on either side.
IMPRESSION: Irregularity of the endometrial lining. Rounded filling defect in
the fundus upon early filling may reflect a fibroid.

Tubal occlusion on the right in the mid fallopian tube. Proximal
tubal occlusion on the left.

## 2016-12-05 LAB — OB RESULTS CONSOLE GC/CHLAMYDIA
CHLAMYDIA, DNA PROBE: NEGATIVE
GC PROBE AMP, GENITAL: NEGATIVE

## 2016-12-05 LAB — OB RESULTS CONSOLE HIV ANTIBODY (ROUTINE TESTING): HIV: NONREACTIVE

## 2016-12-05 LAB — OB RESULTS CONSOLE ABO/RH: RH Type: POSITIVE

## 2016-12-05 LAB — OB RESULTS CONSOLE RPR: RPR: NONREACTIVE

## 2016-12-05 LAB — OB RESULTS CONSOLE HEPATITIS B SURFACE ANTIGEN: Hepatitis B Surface Ag: NEGATIVE

## 2016-12-05 LAB — OB RESULTS CONSOLE RUBELLA ANTIBODY, IGM: RUBELLA: IMMUNE

## 2017-02-05 NOTE — L&D Delivery Note (Signed)
Delivery Note  SVD viable female Apgars 8,8 over intact perineum.  Placenta delivered spontaneously intact with 3VC. Good support and hemostasis noted.  R/V exam confirms.  PH art was sent.   Mother and baby to couplet care and are doing well.  EBL 100cc  Candice Campavid Franny Selvage, MD

## 2017-03-31 ENCOUNTER — Encounter (HOSPITAL_COMMUNITY): Payer: Self-pay

## 2017-03-31 ENCOUNTER — Inpatient Hospital Stay (HOSPITAL_COMMUNITY): Payer: Managed Care, Other (non HMO)

## 2017-03-31 ENCOUNTER — Other Ambulatory Visit: Payer: Self-pay

## 2017-03-31 ENCOUNTER — Inpatient Hospital Stay (HOSPITAL_COMMUNITY)
Admission: AD | Admit: 2017-03-31 | Discharge: 2017-03-31 | Disposition: A | Discharge status: Home / Self Care | Payer: Managed Care, Other (non HMO) | Source: Ambulatory Visit | Attending: Obstetrics and Gynecology | Admitting: Obstetrics and Gynecology

## 2017-03-31 DIAGNOSIS — Z3A24 24 weeks gestation of pregnancy: Secondary | ICD-10-CM | POA: Insufficient documentation

## 2017-03-31 DIAGNOSIS — J40 Bronchitis, not specified as acute or chronic: Secondary | ICD-10-CM | POA: Insufficient documentation

## 2017-03-31 DIAGNOSIS — Z880 Allergy status to penicillin: Secondary | ICD-10-CM | POA: Insufficient documentation

## 2017-03-31 DIAGNOSIS — O9989 Other specified diseases and conditions complicating pregnancy, childbirth and the puerperium: Secondary | ICD-10-CM | POA: Diagnosis not present

## 2017-03-31 DIAGNOSIS — O99512 Diseases of the respiratory system complicating pregnancy, second trimester: Secondary | ICD-10-CM | POA: Diagnosis not present

## 2017-03-31 DIAGNOSIS — H9209 Otalgia, unspecified ear: Secondary | ICD-10-CM | POA: Insufficient documentation

## 2017-03-31 DIAGNOSIS — R05 Cough: Secondary | ICD-10-CM | POA: Diagnosis present

## 2017-03-31 LAB — CBC
HCT: 32 % — ABNORMAL LOW (ref 36.0–46.0)
HEMOGLOBIN: 10.8 g/dL — AB (ref 12.0–15.0)
MCH: 26.9 pg (ref 26.0–34.0)
MCHC: 33.8 g/dL (ref 30.0–36.0)
MCV: 79.8 fL (ref 78.0–100.0)
Platelets: 191 10*3/uL (ref 150–400)
RBC: 4.01 MIL/uL (ref 3.87–5.11)
RDW: 18.9 % — ABNORMAL HIGH (ref 11.5–15.5)
WBC: 5.9 10*3/uL (ref 4.0–10.5)

## 2017-03-31 MED ORDER — SULFAMETHOXAZOLE-TRIMETHOPRIM 800-160 MG PO TABS: 1.0000 | ORAL_TABLET | Freq: Two times a day (BID) | ORAL | 1 refills | Status: DC

## 2017-03-31 MED ORDER — METHYLPREDNISOLONE 4 MG PO TBPK: ORAL_TABLET | ORAL | 0 refills | Status: DC

## 2017-03-31 MED ORDER — BENZONATATE 100 MG PO CAPS: 100.0000 mg | ORAL_CAPSULE | Freq: Three times a day (TID) | ORAL | 0 refills | Status: DC

## 2017-03-31 NOTE — MAU Provider Note (Signed)
Patient April Rodgers is a 32 y.o. [phone] at [redacted]w[redacted]d here with complaints of painful cough. She denies bleeding, leaking of fluid, vaginal discharge, dysuria. She states that she has had cramping throughout the pregnancy and her doctor told her that was normal.   Patient has a history of asthma as a child but not as an adult.  History     CSN: [id number]  Arrival date and time: 03/31/17 1478   None     Chief Complaint  Patient presents with  . Cough    chest congestion   Cough  This is a new problem. The current episode started in the past 7 days. The problem has been unchanged. The problem occurs constantly. The cough is productive of sputum. Associated symptoms include chest pain, ear pain, nasal congestion and a sore throat. Pertinent negatives include no fever. The symptoms are aggravated by lying down. She has tried nothing for the symptoms.  The cough started on Tuesday.  Patient was diagnosed on 2-17 at [facility] and given Tamiflu for 5 days. Her symptoms have gotten better (reduced body aches and fatique). She still has a sore throat and cough. She is here because she is coughing at night; when she lays back at night she starts to cough and her chest hurts. Earlier this week she was coughing up mucous and blood. She denies vomiting, diarrhea.  She also endorses an ear ache for 5 days. It hurts constantly; she rates it as a 6/10. Nothing helps with the pain.  OB History    Gravida Para Term Preterm AB Living   5 3 2  0 1 2   SAB TAB Ectopic Multiple Live Births   0 1 0 1 2      Past Medical History:  Diagnosis Date  . Anemia   . Asherman's syndrome   . 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 Heart murmur   . 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 History of chorioamnionitis    03-10-2015  . History of fetal loss    03-10-2015  non-viable fetal demise twins at [redacted]w[redacted]d due to  chorioamnionitis infection with PPROM  . Hx of varicella   . Hydrosalpinx    RIGHT  . Migraine   . Normal exercise tolerance test results    03-22-2006  . Obstruction of fallopian tube    bilateral occlusion  . Scoliosis     Past Surgical History:  Procedure Laterality Date  . DILATION AND CURETTAGE OF UTERUS  2006  . HYSTEROSCOPY W/D&C N/A 06/28/2015   Procedure: SUCTION DILATATION AND CURETTAGE [name], ;  Surgeon: Fermin Schwab, MD;  Location: [facility] [place];  Service: Gynecology;  Laterality: N/A;  . LAPAROSCOPY Left 06/28/2015   Procedure: LAPAROSCOPY LYSIS OF ADHESIONS, RIGHT SALPINGECTOMY WITH NOVY CATHERIZATION OF LEFT TUBE,EXCISION OF ENDOMETROSIS;  Surgeon: Fermin Schwab, MD;  Location: [facility];  Service: Gynecology;  Laterality: Left;  . TONSILLECTOMY  2012  . WISDOM TOOTH EXTRACTION  2016    Family History  Problem Relation Age of Onset  . Anemia Mother     Social History   Tobacco Use  . Smoking status: Never Smoker  . Smokeless tobacco: Never Used  Substance Use Topics  . Alcohol use: No  . Drug use: No    Allergies:  Allergies  Allergen Reactions  . Penicillins Hives and  Rash    Has patient had a PCN reaction causing immediate rash, facial/tongue/throat swelling, SOB or lightheadedness with hypotension: Yes Has patient had a PCN reaction causing severe rash involving mucus membranes or skin necrosis: No Has patient had a PCN reaction that required hospitalization No Has patient had a PCN reaction occurring within the last 10 years: No If all of the above answers are "NO", then may proceed with Cephalosporin use.     Medications Prior to Admission  Medication Sig Dispense Refill Last Dose  . acetaminophen (TYLENOL) 500 MG tablet Take 1,000 mg by mouth every 6 (six) hours as needed for moderate pain or fever.   03/30/2017 at Unknown time  . docusate sodium (COLACE) 100 MG capsule Take 1 capsule (100 mg total) by  mouth 2 (two) times daily. 30 capsule 2 Past Week at Unknown time  . oseltamivir (TAMIFLU) 75 MG capsule Take 1 capsule by mouth 2 (two) times daily.  0 Past Week at Unknown time  . Prenatal Vit-Fe Fumarate-FA (PRENATAL MULTIVITAMIN) TABS tablet Take 1 tablet by mouth daily at 12 noon.   03/30/2017 at Unknown time    Review of Systems  Constitutional: Negative for fever.  HENT: Positive for ear pain and sore throat.   Respiratory: Positive for cough.   Cardiovascular: Positive for chest pain.  Gastrointestinal: Negative.   Genitourinary: Negative.   Musculoskeletal: Negative.   Neurological: Negative.    Physical Exam   Blood pressure 111/63, pulse 83, temperature 98.4 F (36.9 C), temperature source Oral, resp. rate 20, height 5\' 9"  (1.753 m), weight 158 lb (71.7 kg), SpO2 100 %, unknown if currently breastfeeding.  Physical Exam  Constitutional: She is oriented to person, place, and time. She appears well-developed.  HENT:  Head: Normocephalic.  Eyes: Pupils are equal, round, and reactive to light.  TM is pink; no tragal tenderness. Oropharynx is pink and moist with no exudate.   Neck: Normal range of motion.  Cardiovascular: Normal rate and regular rhythm.  Respiratory: Effort normal. No respiratory distress. She has no wheezes. She has no rales.  GI: Soft.  Musculoskeletal: Normal range of motion.  Neurological: She is alert and oriented to person, place, and time.  Skin: Skin is warm and dry.  Psychiatric: She has a normal mood and affect.    MAU Course  Procedures  MDM -NST: 135 bpm, mod var, present acel, neg decels, no contractions.  -chest x-ray negative; wbc 5.9 -Reviewed x-ray imaging personally, lab findings and patient's physical presentation with Dr. Vincente PoliGrewal. Will d/c home with abx, tessalon perles and steroid.  Assessment and Plan   1. Bronchitis   2. Earache    2. Patient stable for discharge with rx for Septra, Tessalon perles and medrol dose pack. Safe  medications in pregnancy handout given.  3. Return precautions reviewed; [facility] URI symtoms not improved in 5 days.    Charlesetta GaribaldiKathryn Lorraine Kooistra 03/31/2017, 10:59 AM

## 2017-03-31 NOTE — Progress Notes (Signed)
Monitors removed.

## 2017-03-31 NOTE — Discharge Instructions (Signed)
Upper Respiratory Infection, Adult Most upper respiratory infections (URIs) are caused by a virus. A URI affects the nose, throat, and upper air passages. The most common type of URI is often called "the common cold." Follow these instructions at home:  Take medicines only as told by your doctor.  Gargle warm saltwater or take cough drops to comfort your throat as told by your doctor.  Use a warm mist humidifier or inhale steam from a shower to increase air moisture. This may make it easier to breathe.  Drink enough fluid to keep your pee (urine) clear or pale yellow.  Eat soups and other clear broths.  Have a healthy diet.  Rest as needed.  Go back to work when your fever is gone or your doctor says it is okay. ? You may need to stay home longer to avoid giving your URI to others. ? You can also wear a face mask and wash your hands often to prevent spread of the virus.  Use your inhaler more if you have asthma.  Do not use any tobacco products, including cigarettes, chewing tobacco, or electronic cigarettes. If you need help quitting, ask your doctor. Contact a doctor if:  You are getting worse, not better.  Your symptoms are not helped by medicine.  You have chills.  You are getting more short of breath.  You have brown or red mucus.  You have yellow or brown discharge from your nose.  You have pain in your face, especially when you bend forward.  You have a fever.  You have puffy (swollen) neck glands.  You have pain while swallowing.  You have white areas in the back of your throat. Get help right away if:  You have very bad or constant: ? Headache. ? Ear pain. ? Pain in your forehead, behind your eyes, and over your cheekbones (sinus pain). ? Chest pain.  You have long-lasting (chronic) lung disease and any of the following: ? Wheezing. ? Long-lasting cough. ? Coughing up blood. ? A change in your usual mucus.  You have a stiff neck.  You have  changes in your: ? Vision. ? Hearing. ? Thinking. ? Mood. This information is not intended to replace advice given to you by your health care provider. Make sure you discuss any questions you have with your health care provider. Document Released: 07/11/2007 Document Revised: 09/25/2015 Document Reviewed: 04/29/2013 Elsevier Interactive Patient Education  2018 [employer] Acute Bronchitis, Adult Acute bronchitis is when air tubes (bronchi) in the lungs suddenly get swollen. The condition can make it hard to breathe. It can also cause these symptoms:  A cough.  Coughing up clear, yellow, or green mucus.  Wheezing.  Chest congestion.  Shortness of breath.  A fever.  Body aches.  Chills.  A sore throat.  Follow these instructions at home: Medicines  Take over-the-counter and prescription medicines only as told by your doctor.  If you were prescribed an antibiotic medicine, take it as told by your doctor. Do not stop taking the antibiotic even if you start to feel better. General instructions  Rest.  Drink enough fluids to keep your pee (urine) clear or pale yellow.  Avoid smoking and secondhand smoke. If you smoke and you need help quitting, ask your doctor. Quitting will help your lungs heal faster.  Use an inhaler, cool mist vaporizer, or humidifier as told by your doctor.  Keep all follow-up visits as told by your doctor. This is important. How is this prevented?  To lower your risk of getting this condition again:  Wash your hands often with soap and water. If you cannot use soap and water, use hand sanitizer.  Avoid contact with people who have cold symptoms.  Try not to touch your hands to your mouth, nose, or eyes.  Make sure to get the flu shot every year.  Contact a doctor if:  Your symptoms do not get better in 2 weeks. Get help right away if:  You cough up blood.  You have chest pain.  You have very bad shortness of breath.  You become  dehydrated.  You faint (pass out) or keep feeling like you are going to pass out.  You keep throwing up (vomiting).  You have a very bad headache.  Your fever or chills gets worse. This information is not intended to replace advice given to you by your health care provider. Make sure you discuss any questions you have with your health care provider. Document Released: 07/11/2007 Document Revised: 08/31/2015 Document Reviewed: 07/13/2015 Elsevier Interactive Patient Education  [employer].

## 2017-03-31 NOTE — MAU Note (Signed)
Urine in lab 

## 2017-03-31 NOTE — MAU Note (Signed)
Coughing, pain with coughing in her chest.  She has finished a regiment of Tamaflu on Thursday 03/28/2017.

## 2017-06-13 LAB — OB RESULTS CONSOLE GBS: STREP GROUP B AG: NEGATIVE

## 2017-07-03 ENCOUNTER — Other Ambulatory Visit (HOSPITAL_COMMUNITY): Payer: Self-pay

## 2017-07-04 ENCOUNTER — Ambulatory Visit (HOSPITAL_COMMUNITY)
Admission: RE | Admit: 2017-07-04 | Discharge: 2017-07-04 | Disposition: A | Discharge status: Home / Self Care | Payer: Managed Care, Other (non HMO) | Source: Ambulatory Visit | Attending: Obstetrics and Gynecology | Admitting: Obstetrics and Gynecology

## 2017-07-04 DIAGNOSIS — O99012 Anemia complicating pregnancy, second trimester: Secondary | ICD-10-CM | POA: Diagnosis present

## 2017-07-04 MED ORDER — FERUMOXYTOL INJECTION 510 MG/17 ML
510.0000 mg | INTRAVENOUS | Status: DC
  Administered 2017-07-04: 510 mg via INTRAVENOUS
  Filled 2017-07-04: qty 17

## 2017-07-05 ENCOUNTER — Inpatient Hospital Stay (HOSPITAL_COMMUNITY): Payer: Managed Care, Other (non HMO) | Admitting: Anesthesiology

## 2017-07-05 ENCOUNTER — Other Ambulatory Visit: Payer: Self-pay

## 2017-07-05 ENCOUNTER — Inpatient Hospital Stay (HOSPITAL_COMMUNITY)
Admission: AD | Admit: 2017-07-05 | Discharge: 2017-07-08 | DRG: 807 | Disposition: A | Discharge status: Home / Self Care | Payer: Managed Care, Other (non HMO) | Attending: Obstetrics and Gynecology | Admitting: Obstetrics and Gynecology

## 2017-07-05 ENCOUNTER — Encounter (HOSPITAL_COMMUNITY): Payer: Self-pay

## 2017-07-05 DIAGNOSIS — M419 Scoliosis, unspecified: Secondary | ICD-10-CM | POA: Diagnosis present

## 2017-07-05 DIAGNOSIS — O26893 Other specified pregnancy related conditions, third trimester: Principal | ICD-10-CM | POA: Diagnosis present

## 2017-07-05 DIAGNOSIS — Z3490 Encounter for supervision of normal pregnancy, unspecified, unspecified trimester: Secondary | ICD-10-CM

## 2017-07-05 DIAGNOSIS — Z3A38 38 weeks gestation of pregnancy: Secondary | ICD-10-CM | POA: Diagnosis not present

## 2017-07-05 DIAGNOSIS — Z3483 Encounter for supervision of other normal pregnancy, third trimester: Secondary | ICD-10-CM | POA: Diagnosis present

## 2017-07-05 LAB — CBC
HCT: 30.6 % — ABNORMAL LOW (ref 36.0–46.0)
Hemoglobin: 9.9 g/dL — ABNORMAL LOW (ref 12.0–15.0)
MCH: 26 pg (ref 26.0–34.0)
MCHC: 32.4 g/dL (ref 30.0–36.0)
MCV: 80.3 fL (ref 78.0–100.0)
PLATELETS: 183 10*3/uL (ref 150–400)
RBC: 3.81 MIL/uL — ABNORMAL LOW (ref 3.87–5.11)
RDW: 19.1 % — ABNORMAL HIGH (ref 11.5–15.5)
WBC: 9.2 10*3/uL (ref 4.0–10.5)

## 2017-07-05 LAB — TYPE AND SCREEN
ABO/RH(D): O POS
Antibody Screen: NEGATIVE

## 2017-07-05 MED ORDER — LACTATED RINGERS IV SOLN: 500.0000 mL | INTRAVENOUS | Status: DC | PRN

## 2017-07-05 MED ORDER — LACTATED RINGERS IV SOLN
INTRAVENOUS | Status: DC
  Administered 2017-07-05: 22:00:00 via INTRAVENOUS

## 2017-07-05 MED ORDER — ACETAMINOPHEN 325 MG PO TABS: 650.0000 mg | ORAL_TABLET | ORAL | Status: DC | PRN

## 2017-07-05 MED ORDER — OXYCODONE-ACETAMINOPHEN 5-325 MG PO TABS: 1.0000 | ORAL_TABLET | ORAL | Status: DC | PRN

## 2017-07-05 MED ORDER — PHENYLEPHRINE 40 MCG/ML (10ML) SYRINGE FOR IV PUSH (FOR BLOOD PRESSURE SUPPORT)
80.0000 ug | PREFILLED_SYRINGE | INTRAVENOUS | Status: DC | PRN
  Filled 2017-07-05: qty 10

## 2017-07-05 MED ORDER — LACTATED RINGERS IV SOLN
500.0000 mL | Freq: Once | INTRAVENOUS | Status: AC
  Administered 2017-07-05: 500 mL via INTRAVENOUS

## 2017-07-05 MED ORDER — LIDOCAINE HCL (PF) 1 % IJ SOLN
INTRAMUSCULAR | Status: DC | PRN
  Administered 2017-07-05 (×2): 4 mL via EPIDURAL

## 2017-07-05 MED ORDER — LIDOCAINE HCL (PF) 1 % IJ SOLN
30.0000 mL | INTRAMUSCULAR | Status: DC | PRN
  Filled 2017-07-05: qty 30

## 2017-07-05 MED ORDER — ONDANSETRON HCL 4 MG/2ML IJ SOLN: 4.0000 mg | Freq: Four times a day (QID) | INTRAMUSCULAR | Status: DC | PRN

## 2017-07-05 MED ORDER — OXYTOCIN 40 UNITS IN LACTATED RINGERS INFUSION - SIMPLE MED
2.5000 [IU]/h | INTRAVENOUS | Status: DC
  Administered 2017-07-06: 2.5 [IU]/h via INTRAVENOUS
  Filled 2017-07-05: qty 1000

## 2017-07-05 MED ORDER — EPHEDRINE 5 MG/ML INJ: 10.0000 mg | INTRAVENOUS | Status: DC | PRN

## 2017-07-05 MED ORDER — OXYTOCIN BOLUS FROM INFUSION
500.0000 mL | Freq: Once | INTRAVENOUS | Status: AC
  Administered 2017-07-06: 500 mL via INTRAVENOUS

## 2017-07-05 MED ORDER — FENTANYL 2.5 MCG/ML BUPIVACAINE 1/10 % EPIDURAL INFUSION (WH - ANES): 14.0000 mL/h | INTRAMUSCULAR | Status: DC | PRN

## 2017-07-05 MED ORDER — FENTANYL 2.5 MCG/ML BUPIVACAINE 1/10 % EPIDURAL INFUSION (WH - ANES)
14.0000 mL/h | INTRAMUSCULAR | Status: DC | PRN
  Administered 2017-07-05: 14 mL/h via EPIDURAL
  Filled 2017-07-05: qty 100

## 2017-07-05 MED ORDER — PHENYLEPHRINE 40 MCG/ML (10ML) SYRINGE FOR IV PUSH (FOR BLOOD PRESSURE SUPPORT): 80.0000 ug | PREFILLED_SYRINGE | INTRAVENOUS | Status: DC | PRN

## 2017-07-05 MED ORDER — OXYCODONE-ACETAMINOPHEN 5-325 MG PO TABS: 2.0000 | ORAL_TABLET | ORAL | Status: DC | PRN

## 2017-07-05 MED ORDER — SOD CITRATE-CITRIC ACID 500-334 MG/5ML PO SOLN: 30.0000 mL | ORAL | Status: DC | PRN

## 2017-07-05 MED ORDER — DIPHENHYDRAMINE HCL 50 MG/ML IJ SOLN: 12.5000 mg | INTRAMUSCULAR | Status: DC | PRN

## 2017-07-05 NOTE — Anesthesia Procedure Notes (Signed)
Epidural Patient location during procedure: OB Start time: 07/05/2017 9:48 PM End time: 07/05/2017 9:57 PM  Staffing Anesthesiologist: Lewie Loron, MD Performed: anesthesiologist   Preanesthetic Checklist Completed: patient identified, pre-op evaluation, timeout performed, IV checked, risks and benefits discussed and monitors and equipment checked  Epidural Patient position: sitting Prep: site prepped and draped and DuraPrep Patient monitoring: heart rate, continuous pulse ox and blood pressure Approach: midline Location: L3-L4 Injection technique: LOR air and LOR saline  Needle:  Needle type: Tuohy  Needle gauge: 17 G Needle length: 9 cm Needle insertion depth: 6 cm Catheter type: closed end flexible Catheter size: 19 Gauge Catheter at skin depth: 11 cm Test dose: negative  Assessment Sensory level: T8 Events: blood not aspirated, injection not painful, no injection resistance, negative IV test and no paresthesia  Additional Notes Reason for block:procedure for pain

## 2017-07-05 NOTE — Anesthesia Preprocedure Evaluation (Signed)
Anesthesia Evaluation  Patient identified by MRN, date of birth, ID band Patient awake    Reviewed: Allergy & Precautions, NPO status , Patient's Chart, lab work & pertinent test results  History of Anesthesia Complications Negative for: history of anesthetic complications  Airway Mallampati: II   Neck ROM: Full    Dental  (+) Dental Advisory Given, Teeth Intact   Pulmonary neg pulmonary ROS,    breath sounds clear to auscultation       Cardiovascular + Valvular Problems/Murmurs MVP  Rhythm:Regular Rate:Normal     Neuro/Psych negative neurological ROS     GI/Hepatic negative GI ROS, Neg liver ROS,   Endo/Other  negative endocrine ROS  Renal/GU negative Renal ROS     Musculoskeletal   Abdominal   Peds  Hematology plt 205k   Anesthesia Other Findings   Reproductive/Obstetrics (+) Pregnancy                             Anesthesia Physical  Anesthesia Plan  ASA: II  Anesthesia Plan: Epidural   Post-op Pain Management:    Induction:   PONV Risk Score and Plan:   Airway Management Planned:   Additional Equipment:   Intra-op Plan:   Post-operative Plan:   Informed Consent: I have reviewed the patients History and Physical, chart, labs and discussed the procedure including the risks, benefits and alternatives for the proposed anesthesia with the patient or authorized representative who has indicated his/her understanding and acceptance.     Plan Discussed with:   Anesthesia Plan Comments:         Anesthesia Quick Evaluation

## 2017-07-05 NOTE — H&P (Signed)
April Rodgers is a 32 y.o. female presenting for labor sxs.  Pregnancy uncomplicated.  GBS-. OB History    Gravida  5   Para  3   Term  2   Preterm  0   AB  1   Living  2     SAB  0   TAB  1   Ectopic  0   Multiple  1   Live Births  2          Past Medical History:  Diagnosis Date  . Anemia   . Asherman's syndrome   . 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 Heart murmur   . 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 History of chorioamnionitis    03-10-2015  . History of fetal loss    03-10-2015  non-viable fetal demise twins at [redacted]w[redacted]d due to chorioamnionitis infection with PPROM  . Hx of varicella   . Hydrosalpinx    RIGHT  . Migraine   . Normal exercise tolerance test results    03-22-2006  . Obstruction of fallopian tube    bilateral occlusion  . Scoliosis    Past Surgical History:  Procedure Laterality Date  . DILATION AND CURETTAGE OF UTERUS  2006  . HYSTEROSCOPY W/D&Rodgers N/A 06/28/2015   Procedure: SUCTION DILATATION AND CURETTAGE [name], ;  Surgeon: Fermin Schwab, MD;  Location[facility];  Service: Gynecology;  Laterality: N/A;  . LAPAROSCOPY Left 06/28/2015   Procedure: LAPAROSCOPY LYSIS OF ADHESIONS, RIGHT SALPINGECTOMY WITH NOVY CATHERIZATION OF LEFT TUBE,EXCISION OF ENDOMETROSIS;  Surgeon: Fermin Schwab, MD;  Location: [facility];  Service: Gynecology;  Laterality: Left;  . TONSILLECTOMY  2012  . WISDOM TOOTH EXTRACTION  2016   Family History: family history includes Anemia in her mother. Social History:  reports that she has never smoked. She has never used smokeless tobacco. She reports that she does not drink alcohol or use drugs.     Maternal Diabetes: No Genetic Screening: Normal Maternal Ultrasounds/Referrals: Normal Fetal Ultrasounds or other Referrals:  None Maternal Substance Abuse:   No Significant Maternal Medications:  None Significant Maternal Lab Results:  None Other Comments:  None  ROS History Dilation: 7 Effacement (%): 80 Station: 0 Exam by:: Mervin Kung, RN Blood pressure (!) 98/56, pulse 82, temperature 98.1 F (36.7 Rodgers), temperature source Oral, resp. rate 16, height  (1.753 m), weight 175 lb (79.4 kg), unknown if currently breastfeeding. Exam Physical Exam  Prenatal labs: ABO, Rh: O/Positive/-- (10/31 0000) Antibody:   Rubella: Immune (10/31 0000) RPR: Nonreactive (10/31 0000)  HBsAg: Negative (10/31 0000)  HIV: Non-reactive (10/31 0000)  GBS: Negative (05/09 0000)   Assessment/Plan: IUP at term Active labor Anticipate svd   April Rodgers 07/05/2017, 10:44 PM

## 2017-07-05 NOTE — MAU Note (Signed)
Pt presents MAU via EMS with ctx that started earlier today at 6 pm. Pt was seen in office today and dilated to 2 cm. Pt  Has had bloody show but denies LOF. +FM

## 2017-07-06 ENCOUNTER — Encounter (HOSPITAL_COMMUNITY): Payer: Self-pay

## 2017-07-06 LAB — CBC
HEMATOCRIT: 29.4 % — AB (ref 36.0–46.0)
HEMOGLOBIN: 9.6 g/dL — AB (ref 12.0–15.0)
MCH: 26.2 pg (ref 26.0–34.0)
MCHC: 32.7 g/dL (ref 30.0–36.0)
MCV: 80.3 fL (ref 78.0–100.0)
Platelets: 168 10*3/uL (ref 150–400)
RBC: 3.66 MIL/uL — AB (ref 3.87–5.11)
RDW: 19.2 % — ABNORMAL HIGH (ref 11.5–15.5)
WBC: 11.4 10*3/uL — ABNORMAL HIGH (ref 4.0–10.5)

## 2017-07-06 LAB — RPR: RPR: NONREACTIVE

## 2017-07-06 MED ORDER — SIMETHICONE 80 MG PO CHEW: 80.0000 mg | CHEWABLE_TABLET | ORAL | Status: DC | PRN

## 2017-07-06 MED ORDER — DIPHENHYDRAMINE HCL 25 MG PO CAPS
25.0000 mg | ORAL_CAPSULE | Freq: Four times a day (QID) | ORAL | Status: DC | PRN
Start: 2017-07-06 — End: 2017-07-08

## 2017-07-06 MED ORDER — BENZOCAINE-MENTHOL 20-0.5 % EX AERO
1.0000 "application " | INHALATION_SPRAY | CUTANEOUS | Status: DC | PRN
  Administered 2017-07-06: 1 via TOPICAL
  Filled 2017-07-06: qty 56

## 2017-07-06 MED ORDER — COCONUT OIL OIL
1.0000 "application " | TOPICAL_OIL | Status: DC | PRN
  Administered 2017-07-07: 1 via TOPICAL
  Filled 2017-07-06: qty 120

## 2017-07-06 MED ORDER — ONDANSETRON HCL 4 MG/2ML IJ SOLN: 4.0000 mg | INTRAMUSCULAR | Status: DC | PRN

## 2017-07-06 MED ORDER — PRENATAL MULTIVITAMIN CH
1.0000 | ORAL_TABLET | Freq: Every day | ORAL | Status: DC
  Administered 2017-07-06 – 2017-07-08 (×3): 1 via ORAL
  Filled 2017-07-06 (×3): qty 1

## 2017-07-06 MED ORDER — WITCH HAZEL-GLYCERIN EX PADS: 1.0000 "application " | MEDICATED_PAD | CUTANEOUS | Status: DC | PRN

## 2017-07-06 MED ORDER — MEDROXYPROGESTERONE ACETATE 150 MG/ML IM SUSP: 150.0000 mg | INTRAMUSCULAR | Status: DC | PRN

## 2017-07-06 MED ORDER — OXYCODONE-ACETAMINOPHEN 5-325 MG PO TABS: 2.0000 | ORAL_TABLET | ORAL | Status: DC | PRN

## 2017-07-06 MED ORDER — IBUPROFEN 600 MG PO TABS
600.0000 mg | ORAL_TABLET | Freq: Four times a day (QID) | ORAL | Status: DC
  Administered 2017-07-06 – 2017-07-08 (×10): 600 mg via ORAL
  Filled 2017-07-06 (×10): qty 1

## 2017-07-06 MED ORDER — DIBUCAINE 1 % RE OINT: 1.0000 "application " | TOPICAL_OINTMENT | RECTAL | Status: DC | PRN

## 2017-07-06 MED ORDER — OXYCODONE-ACETAMINOPHEN 5-325 MG PO TABS
1.0000 | ORAL_TABLET | ORAL | Status: DC | PRN
Start: 2017-07-06 — End: 2017-07-08
  Administered 2017-07-06 – 2017-07-08 (×5): 1 via ORAL
  Filled 2017-07-06 (×6): qty 1

## 2017-07-06 MED ORDER — MEASLES, MUMPS & RUBELLA VAC ~~LOC~~ INJ
0.5000 mL | INJECTION | Freq: Once | SUBCUTANEOUS | Status: DC
  Filled 2017-07-06: qty 0.5

## 2017-07-06 MED ORDER — SENNOSIDES-DOCUSATE SODIUM 8.6-50 MG PO TABS
2.0000 | ORAL_TABLET | ORAL | Status: DC
  Administered 2017-07-06 – 2017-07-07 (×2): 2 via ORAL
  Filled 2017-07-06 (×2): qty 2

## 2017-07-06 MED ORDER — TETANUS-DIPHTH-ACELL PERTUSSIS 5-2.5-18.5 LF-MCG/0.5 IM SUSP: 0.5000 mL | Freq: Once | INTRAMUSCULAR | Status: DC

## 2017-07-06 MED ORDER — ZOLPIDEM TARTRATE 5 MG PO TABS: 5.0000 mg | ORAL_TABLET | Freq: Every evening | ORAL | Status: DC | PRN

## 2017-07-06 MED ORDER — ACETAMINOPHEN 325 MG PO TABS: 650.0000 mg | ORAL_TABLET | ORAL | Status: DC | PRN

## 2017-07-06 MED ORDER — ONDANSETRON HCL 4 MG PO TABS: 4.0000 mg | ORAL_TABLET | ORAL | Status: DC | PRN

## 2017-07-06 NOTE — Lactation Note (Signed)
This note was copied from a baby's chart. Lactation Consultation Note Baby 5 hrs old. Assisted mom in BF and latching. Mom stated she may have BF her other children 1 month.  Mom has everted nipples. Baby had shallow latch in cradle position when LC entered rm.  Discussed feeding positions, comfort, support and baby body alignment. Newborn behavior, STS, I&O, cluster feeding discussed. Encouraged mom to call for assistance or questions. Mom encouraged to feed baby 8-12 times/24 hours and with feeding cues. WH/LC brochure given w/resources, support groups and LC services.  Patient Name: April Rodgers ZOXWR'UToday's Date: 07/06/2017 Reason for consult: Initial assessment   Maternal Data Has patient been taught Hand Expression?: Yes Does the patient have breastfeeding experience prior to this delivery?: Yes  Feeding Feeding Type: Breast Fed Nipple Type: (few sucks/ lactation came in and will adjust a poor latch) Length of feed: 5 min(still feeding)  LATCH Score Latch: Grasps breast easily, tongue down, lips flanged, rhythmical sucking.  Audible Swallowing: A few with stimulation  Type of Nipple: Everted at rest and after stimulation  Comfort (Breast/Nipple): Soft / non-tender  Hold (Positioning): Assistance needed to correctly position infant at breast and maintain latch.  LATCH Score: 8  Interventions Interventions: Breast feeding basics reviewed;Support pillows;Assisted with latch;Position options;Skin to skin;Breast massage;Hand express;Breast compression;Adjust position  Lactation Tools Discussed/Used WIC Program: Yes   Consult Status Consult Status: Follow-up Date: 07/07/17 Follow-up type: In-patient    Charyl DancerCARVER, Kirstyn Lean G 07/06/2017, 7:26 AM

## 2017-07-06 NOTE — Progress Notes (Signed)
CSW received consult for patient due to having a history of postpartum depression. CSW met with patient, newborn, and FOB at bedside to discuss situation. CSW obtained permission from patient to have conversation with FOB present. Patient states that she was on Zoloft for about three months prior to getting pregnant. Patient reports having a good mood at this time. Patient stated that her postpartum depressions were moderate and she has yet to have those feelings since delivery. Patient reports having a good support system outside of the hospital. [place] inquired with patient regarding comfort level with reaching out to PCP/OBGYN for evaluation if mental health needs arise, patient stated she was comfortable doing that. CSW encouraged patient to reach out if needs or concerns arise before or after discharge, stated agreement.  Madilyn Fireman, MSW, [place] Social Worker [facility] [phone]

## 2017-07-06 NOTE — Progress Notes (Signed)
Post Partum Day 0 Subjective: no complaints and tolerating PO  Objective: Blood pressure 119/74, pulse 67, temperature 98.4 F (36.9 C), temperature source Oral, resp. rate 16, height 5\' 9"  (1.753 m), weight 175 lb (79.4 kg), unknown if currently breastfeeding.  Physical Exam:  General: alert, cooperative, appears stated age and no distress Lochia: appropriate Uterine Fundus: firm Incision: healing well DVT Evaluation: No evidence of DVT seen on physical exam.  Recent Labs    07/05/17 2129 07/06/17 0604  HGB 9.9* 9.6*  HCT 30.6* 29.4*    Assessment/Plan: Plan for discharge tomorrow, Breastfeeding and Circumcision prior to discharge   LOS: 1 day   Cristi Gwynn C 07/06/2017, 9:47 AM

## 2017-07-06 NOTE — Anesthesia Postprocedure Evaluation (Signed)
Anesthesia Post Note  Patient: April Rodgers  Procedure(s) Performed: AN AD HOC LABOR EPIDURAL     Patient location during evaluation: Mother Baby Anesthesia Type: Epidural Level of consciousness: awake and alert and oriented Pain management: satisfactory to patient Vital Signs Assessment: post-procedure vital signs reviewed and stable Respiratory status: respiratory function stable Cardiovascular status: stable Postop Assessment: no headache, no backache, epidural receding, patient able to bend at knees, no signs of nausea or vomiting and adequate PO intake Anesthetic complications: no    Last Vitals:  Vitals:   07/06/17 0405 07/06/17 1015  BP: 119/74 102/63  Pulse: 67 64  Resp: 16 18  Temp: 36.9 C 36.9 C    Last Pain:  Vitals:   07/06/17 1015  TempSrc: Oral  PainSc:    Pain Goal:                 Janmarie Smoot

## 2017-07-07 NOTE — Progress Notes (Signed)
Post Partum Day 1 Subjective: no complaints, up ad lib, voiding and tolerating PO  Objective: Blood pressure (!) 96/50, pulse 69, temperature 98.1 F (36.7 C), temperature source Axillary, resp. rate 18, height 5\' 9"  (1.753 m), weight 175 lb (79.4 kg), unknown if currently breastfeeding.  Physical Exam:  General: alert, cooperative, appears stated age and no distress Lochia: appropriate Uterine Fundus: firm Incision: healing well DVT Evaluation: No evidence of DVT seen on physical exam.  Recent Labs    07/05/17 2129 07/06/17 0604  HGB 9.9* 9.6*  HCT 30.6* 29.4*    Assessment/Plan: Plan for discharge tomorrow, Breastfeeding and Circumcision prior to discharge   LOS: 2 days   Alicya Bena C 07/07/2017, 9:15 AM

## 2017-07-07 NOTE — Lactation Note (Signed)
This note was copied from a baby's chart. Lactation Consultation Note  Patient Name: April Rodgers WUJWJ'XToday's Date: 07/07/2017 Reason for consult: Follow-up assessment Mom has baby in football hold on left.  Baby is latched well and feeding with active suck/swallows noted.  Mom states nipples are a little sore.  Using coconut oil after feeds.  Instructed to feed with cues and call for assist prn.  Maternal Data    Feeding Feeding Type: Breast Fed Length of feed: 14 min  LATCH Score Latch: Grasps breast easily, tongue down, lips flanged, rhythmical sucking.  Audible Swallowing: A few with stimulation  Type of Nipple: Everted at rest and after stimulation  Comfort (Breast/Nipple): Filling, red/small blisters or bruises, mild/mod discomfort  Hold (Positioning): No assistance needed to correctly position infant at breast.  LATCH Score: 8  Interventions    Lactation Tools Discussed/Used     Consult Status Consult Status: Follow-up Date: 07/08/17    Huston FoleyMOULDEN, [name] 07/07/2017, 11:27 AM

## 2017-07-08 ENCOUNTER — Ambulatory Visit (HOSPITAL_COMMUNITY): Admission: RE | Admit: 2017-07-08 | Payer: Managed Care, Other (non HMO) | Source: Ambulatory Visit

## 2017-07-08 MED ORDER — OXYCODONE-ACETAMINOPHEN 5-325 MG PO TABS: 1.0000 | ORAL_TABLET | ORAL | 0 refills | Status: DC | PRN

## 2017-07-08 MED ORDER — IBUPROFEN 600 MG PO TABS: 600.0000 mg | ORAL_TABLET | Freq: Four times a day (QID) | ORAL | 0 refills | Status: DC

## 2017-07-08 NOTE — Discharge Summary (Signed)
Obstetric Discharge Summary Reason for Admission: onset of labor Prenatal Procedures: none Intrapartum Procedures: spontaneous vaginal delivery Postpartum Procedures: none Complications-Operative and Postpartum: 2nd degree perineal laceration Hemoglobin  Date Value Ref Range Status  07/06/2017 9.6 (L) 12.0 - 15.0 g/dL Final   HCT  Date Value Ref Range Status  07/06/2017 29.4 (L) 36.0 - 46.0 % Final    Physical Exam:  General: alert, cooperative and appears stated age 89Lochia: appropriate Uterine Fundus: firm Incision: healing well, no significant drainage, no dehiscence DVT Evaluation: No evidence of DVT seen on physical exam.  Discharge Diagnoses: Term Pregnancy-delivered  Discharge Information: Date: 07/08/2017 Activity: pelvic rest Diet: routine Medications: Ibuprofen and Percocet Condition: improved Instructions: refer to practice specific booklet Discharge to: home   Newborn Data: Live born female  Birth Weight: 6 lb 13 oz (3090 g) APGAR: 7, 8  Newborn Delivery   Birth date/time:  07/06/2017 01:31:00 Delivery type:  Vaginal, Spontaneous     Home with mother.  Kord Monette L 07/08/2017, 9:01 AM

## 2017-07-08 NOTE — Lactation Note (Signed)
This note was copied from a baby's chart. Lactation Consultation Note  Patient Name: April Rodgers: 07/08/2017 Reason for consult: Follow-up assessment Breasts are fuller this morning.  She can easily hand express breast milk.  Assisted with positioning baby in cross cradle hold on right side.  Baby latched easily and well.  Observed good swallows.  Instructed on engorgement treatment.  Mom has a breast pump at home.  Lactation outpatient services and support reviewed and encouraged prn.  Maternal Data Has patient been taught Hand Expression?: Yes  Feeding Feeding Type: Breast Fed Nipple Type: Slow - flow Length of feed: 25 min  LATCH Score Latch: Grasps breast easily, tongue down, lips flanged, rhythmical sucking.  Audible Swallowing: Spontaneous and intermittent  Type of Nipple: Everted at rest and after stimulation  Comfort (Breast/Nipple): Soft / non-tender  Hold (Positioning): No assistance needed to correctly position infant at breast.  LATCH Score: 10  Interventions    Lactation Tools Discussed/Used     Consult Status Consult Status: Complete Follow-up type: Call as needed    Huston FoleyMOULDEN, [name] 07/08/2017, 8:17 AM

## 2017-07-08 NOTE — Lactation Note (Signed)
This note was copied from a baby's chart. Lactation Consultation Note  Patient Name: April Rodgers HairShantera Thome ZOXWR'UToday's Date: 07/08/2017 Reason for consult: Follow-up assessment   Maternal Data Has patient been taught Hand Expression?: Yes  Feeding Feeding Type: Breast Fed Nipple Type: Slow - flow Length of feed: 25 min  LATCH Score Latch: Grasps breast easily, tongue down, lips flanged, rhythmical sucking.  Audible Swallowing: Spontaneous and intermittent  Type of Nipple: Everted at rest and after stimulation  Comfort (Breast/Nipple): Soft / non-tender  Hold (Positioning): No assistance needed to correctly position infant at breast.  LATCH Score: 10  Interventions    Lactation Tools Discussed/Used     Consult Status Consult Status: Complete Follow-up type: Call as needed    Huston FoleyMOULDEN, Marvis Saefong S 07/08/2017, 8:22 AM

## 2017-07-15 ENCOUNTER — Inpatient Hospital Stay (HOSPITAL_COMMUNITY): Payer: Managed Care, Other (non HMO)

## 2020-09-19 ENCOUNTER — Other Ambulatory Visit: Payer: Self-pay

## 2020-09-19 ENCOUNTER — Ambulatory Visit
Admission: RE | Admit: 2020-09-19 | Discharge: 2020-09-19 | Disposition: A | Discharge status: Home / Self Care | Payer: Managed Care, Other (non HMO) | Source: Ambulatory Visit

## 2020-09-19 VITALS — BP 112/76 | HR 79 | Temp 98.1°F | Resp 15

## 2020-09-19 DIAGNOSIS — R5383 Other fatigue: Secondary | ICD-10-CM | POA: Diagnosis not present

## 2020-09-19 DIAGNOSIS — M545 Low back pain, unspecified: Secondary | ICD-10-CM | POA: Diagnosis not present

## 2020-09-19 DIAGNOSIS — G8929 Other chronic pain: Secondary | ICD-10-CM | POA: Diagnosis not present

## 2020-09-19 LAB — POCT URINALYSIS DIP (MANUAL ENTRY)
Bilirubin, UA: NEGATIVE
Glucose, UA: NEGATIVE mg/dL
Ketones, POC UA: NEGATIVE mg/dL
Leukocytes, UA: NEGATIVE
Nitrite, UA: NEGATIVE
Protein Ur, POC: NEGATIVE mg/dL
Spec Grav, UA: <=1.005 — AB (ref 1.010–1.025)
Urobilinogen, UA: 0.2 E.U./dL
pH, UA: 7 (ref 5.0–8.0)

## 2020-09-19 LAB — POCT URINE PREGNANCY: Preg Test, Ur: NEGATIVE

## 2020-09-19 MED ORDER — NAPROXEN 500 MG PO TABS: 500.0000 mg | ORAL_TABLET | Freq: Two times a day (BID) | ORAL | 0 refills | Status: DC

## 2020-09-19 NOTE — ED Provider Notes (Signed)
[facility]    CSN: [id number] Arrival date & time: 09/19/20  1118      History   Chief Complaint Chief Complaint  Patient presents with   Fatigue   Back Pain   APPT 1130    HPI April Rodgers is a 35 y.o. female presenting today for evaluation of fatigue and back pain.  Symptoms have been going on for approximately 2 weeks.  Symptoms have been more persistent recently, but overall have been intermittent for months.  Reports history of anemia, but reports that she has never been diagnosed with a specific type of anemia.  Has taken iron supplements in the past.  Denies any heavy menstrual bleeding or prolonged bleeding.  She also reports associated back pain which has been going on for a while as well.  Has previously tried ibuprofen and Robaxin without relief.  Denies urinary symptoms, denies any vaginal symptoms.  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   History of chorioamnionitis    03-10-2015   History of fetal loss    03-10-2015  non-viable fetal demise twins at [redacted]w[redacted]d due to chorioamnionitis infection with PPROM   Hx of varicella    Hydrosalpinx    RIGHT   Migraine    Normal exercise tolerance test results    03-22-2006   Obstruction of fallopian tube    bilateral occlusion   Scoliosis     Patient Active Problem List   Diagnosis Date Noted   Pregnancy 07/05/2017   Term pregnancy 05/17/2016   Chorioamnionitis in second trimester 03/10/2015   Twin pregnancy, antepartum 03/10/2015   Vaginal delivery 03/10/2015   PROM (premature rupture of membranes) 03/04/2015    Past Surgical History:  Procedure Laterality Date   DILATION AND CURETTAGE OF UTERUS  2006   HYSTEROSCOPY WITH D & C N/A 06/28/2015   Procedure: SUCTION DILATATION  AND CURETTAGE [name], ;  Surgeon: Fermin Schwab, MD;  Location: [facility];  Service: Gynecology;  Laterality: N/A;   LAPAROSCOPY Left 06/28/2015   Procedure: LAPAROSCOPY LYSIS OF ADHESIONS, RIGHT SALPINGECTOMY WITH NOVY CATHERIZATION OF LEFT TUBE,EXCISION OF ENDOMETROSIS;  Surgeon: Fermin Schwab, MD;  Location:  SURGERY CENTER;  Service: Gynecology;  Laterality: Left;   TONSILLECTOMY  2012   WISDOM TOOTH EXTRACTION  2016    OB History     Gravida  5   Para  4   Term  3   Preterm  0   AB  1   Living  3      SAB  0   IAB  1   Ectopic  0   Multiple  1   Live Births  3            Home Medications    Prior to Admission medications   Medication Sig Start Date End Date Taking? Authorizing Provider  naproxen (NAPROSYN) 500 MG tablet Take 1 tablet (500 mg total) by mouth 2 (two) times daily. 09/19/20  Yes Matisse Salais C, PA-C  sertraline (ZOLOFT) 50 MG tablet Take 50 mg by mouth daily. 07/29/20  Yes [provider]  acetaminophen (TYLENOL) 500 MG tablet Take 1,000 mg by mouth every 6 (six) hours  as needed for moderate pain or fever.    [provider]  docusate sodium (COLACE) 100 MG capsule Take 1 capsule (100 mg total) by mouth 2 (two) times daily. 05/19/16   Ranae Pila, MD  oxyCODONE-acetaminophen (PERCOCET/ROXICET) 5-325 MG tablet Take 1 tablet by mouth every 4 (four) hours as needed (pain scale 4-7). 07/08/17   Marcelle Overlie, MD  Prenatal Vit-Fe Fumarate-FA (PRENATAL MULTIVITAMIN) TABS tablet Take 1 tablet by mouth daily at 12 noon.    [provider]    Family History Family History  Problem Relation Age of Onset   Anemia Mother     Social History Social History   Tobacco Use   Smoking status: Never   Smokeless tobacco: Never  Substance Use Topics   Alcohol use: No   Drug use: No     Allergies   Penicillins   Review of Systems Review of Systems  Constitutional:   Positive for fatigue. Negative for fever.  HENT:  Negative for congestion, sinus pressure and sore throat.   Eyes:  Negative for photophobia, pain and visual disturbance.  Respiratory:  Negative for cough and shortness of breath.   Cardiovascular:  Negative for chest pain.  Gastrointestinal:  Negative for abdominal pain, nausea and vomiting.  Genitourinary:  Negative for decreased urine volume and hematuria.  Musculoskeletal:  Positive for back pain and myalgias. Negative for neck pain and neck stiffness.  Neurological:  Positive for headaches. Negative for dizziness, syncope, facial asymmetry, speech difficulty, weakness, light-headedness and numbness.    Physical Exam Triage Vital Signs ED Triage Vitals [09/19/20 1133]  Enc Vitals Group     BP      Pulse      Resp      Temp      Temp src      SpO2      Weight      Height      Head Circumference      Peak Flow      Pain Score 7     Pain Loc      Pain Edu?      Excl. in GC?    No data found.  Updated Vital Signs BP 112/76 (BP Location: Right Arm)   Pulse 79   Temp 98.1 F (36.7 C) (Oral)   Resp 15   LMP 09/02/2020 (Exact Date)   SpO2 98%   Breastfeeding No   Visual Acuity Right Eye Distance:   Left Eye Distance:   Bilateral Distance:    Right Eye Near:   Left Eye Near:    Bilateral Near:     Physical Exam Vitals and nursing note reviewed.  Constitutional:      Appearance: She is well-developed.     Comments: No acute distress  HENT:     Head: Normocephalic and atraumatic.     Right Ear: Tympanic membrane normal.     Left Ear: Tympanic membrane normal.     Nose: Nose normal.     Mouth/Throat:     Comments: Oral mucosa pink and moist, no tonsillar enlargement or exudate. Posterior pharynx patent and nonerythematous, no uvula deviation or swelling. Normal phonation.  Eyes:     Conjunctiva/sclera: Conjunctivae normal.  Cardiovascular:     Rate and Rhythm: Normal rate and regular rhythm.  Pulmonary:      Effort: Pulmonary effort is normal. No respiratory distress.     Comments: Breathing comfortably at rest, CTABL, no wheezing, rales or other adventitious sounds auscultated  Abdominal:     General: There is no distension.  Musculoskeletal:        General: Normal range of motion.     Cervical back: Neck supple.  Skin:    General: Skin is warm and dry.  Neurological:     Mental Status: She is alert and oriented to person, place, and time.     UC Treatments / Results  Labs (all labs ordered are listed, but only abnormal results are displayed) Labs Reviewed  POCT URINALYSIS DIP (MANUAL ENTRY) - Abnormal; Notable for the following components:      Result Value   Color, UA light yellow (*)    Spec Grav, UA <=1.005 (*)    Blood, UA trace-intact (*)    All other components within normal limits  CBC WITH DIFFERENTIAL/PLATELET  COMPREHENSIVE METABOLIC PANEL  TSH  VITAMIN D 25 HYDROXY (VIT D DEFICIENCY, FRACTURES)  IRON AND TIBC  POCT URINE PREGNANCY    EKG   Radiology No results found.  Procedures Procedures (including critical care time)  Medications Ordered in UC Medications - No data to display  Initial Impression / Assessment and Plan / UC Course  I have reviewed the triage vital signs and the nursing notes.  Pertinent labs & imaging results that were available during my care of the patient were reviewed by me and considered in my medical decision making (see chart for details).     UA with negative leuks and nitrites, does have trace hemoglobin, but lower suspicion of stone causing back pain.  Recommended Naprosyn for use of back pain in the interim.  Checking blood work for thyroid, vitamin D and rechecking anemia.  We will call with results and provide further treatment as needed.  Encouraged follow-up with PCP if symptoms persistent.  Discussed strict return precautions. Patient verbalized understanding and is agreeable with plan.  Final Clinical Impressions(s) /  UC Diagnoses   Final diagnoses:  Fatigue, unspecified type  Chronic bilateral low back pain without sciatica     Discharge Instructions      Blood work pending-checking basic labs, hemoglobin, thyroid, vitamin D and iron level Referral placed to physical therapy Naprosyn twice daily with food Follow-up with primary care as planned Return if any symptoms not improving or worsening     ED Prescriptions     Medication Sig Dispense Auth. Provider   naproxen (NAPROSYN) 500 MG tablet Take 1 tablet (500 mg total) by mouth 2 (two) times daily. 30 tablet Jamichael Knotts, [place] C, PA-C      PDMP not reviewed this encounter.   Lew Dawes, [place] 09/19/20 1307

## 2020-09-19 NOTE — Discharge Instructions (Addendum)
Blood work pending-checking basic labs, hemoglobin, thyroid, vitamin D and iron level Referral placed to physical therapy Naprosyn twice daily with food Follow-up with primary care as planned Return if any symptoms not improving or worsening

## 2020-09-19 NOTE — ED Triage Notes (Addendum)
Patient c/o fatigue and bilateral lower back pain x 2 weeks.   Patient denies fever or dysuria. Patient denies any recent heavy menstrual bleeding.   Patient denies fall or trauma.  Patient endorses lightheadedness.   History of Anemia.   Patient hasn't taken any medications for symptoms.

## 2020-09-20 ENCOUNTER — Telehealth: Payer: Self-pay | Admitting: Emergency Medicine

## 2020-09-20 LAB — CBC WITH DIFFERENTIAL/PLATELET
Basophils Absolute: 0 10*3/uL (ref 0.0–0.2)
Basos: 1 %
EOS (ABSOLUTE): 0 10*3/uL (ref 0.0–0.4)
Eos: 1 %
Hematocrit: 33.6 % — ABNORMAL LOW (ref 34.0–46.6)
Hemoglobin: 11 g/dL — ABNORMAL LOW (ref 11.1–15.9)
Immature Grans (Abs): 0 10*3/uL (ref 0.0–0.1)
Immature Granulocytes: 0 %
Lymphocytes Absolute: 1.7 10*3/uL (ref 0.7–3.1)
Lymphs: 52 %
MCH: 25.8 pg — ABNORMAL LOW (ref 26.6–33.0)
MCHC: 32.7 g/dL (ref 31.5–35.7)
MCV: 79 fL (ref 79–97)
Monocytes Absolute: 0.3 10*3/uL (ref 0.1–0.9)
Monocytes: 11 %
Neutrophils Absolute: 1.1 10*3/uL — ABNORMAL LOW (ref 1.4–7.0)
Neutrophils: 35 %
RBC: 4.26 x10E6/uL (ref 3.77–5.28)
RDW: 19 % — ABNORMAL HIGH (ref 11.7–15.4)
WBC: 3.2 10*3/uL — ABNORMAL LOW (ref 3.4–10.8)

## 2020-09-20 LAB — COMPREHENSIVE METABOLIC PANEL
ALT: 9 IU/L (ref 0–32)
AST: 11 IU/L (ref 0–40)
Albumin/Globulin Ratio: 1.9 (ref 1.2–2.2)
Albumin: 4.6 g/dL (ref 3.8–4.8)
Alkaline Phosphatase: 70 IU/L (ref 44–121)
BUN/Creatinine Ratio: 7 — ABNORMAL LOW (ref 9–23)
BUN: 5 mg/dL — ABNORMAL LOW (ref 6–20)
Bilirubin Total: 0.2 mg/dL (ref 0.0–1.2)
CO2: 21 mmol/L (ref 20–29)
Calcium: 9.2 mg/dL (ref 8.7–10.2)
Chloride: 106 mmol/L (ref 96–106)
Creatinine, Ser: 0.71 mg/dL (ref 0.57–1.00)
Globulin, Total: 2.4 g/dL (ref 1.5–4.5)
Glucose: 92 mg/dL (ref 65–99)
Potassium: 3.9 mmol/L (ref 3.5–5.2)
Sodium: 141 mmol/L (ref 134–144)
Total Protein: 7 g/dL (ref 6.0–8.5)
eGFR: 114 mL/min/{1.73_m2} (ref >59)

## 2020-09-20 LAB — IRON AND TIBC
Iron Saturation: 16 % (ref 15–55)
Iron: 49 ug/dL (ref 27–159)
Total Iron Binding Capacity: 301 ug/dL (ref 250–450)
UIBC: 252 ug/dL (ref 131–425)

## 2020-09-20 LAB — TSH: TSH: 1.54 u[IU]/mL (ref 0.450–4.500)

## 2020-09-20 LAB — VITAMIN D 25 HYDROXY (VIT D DEFICIENCY, FRACTURES): Vit D, 25-Hydroxy: 9.4 ng/mL — ABNORMAL LOW (ref 30.0–100.0)

## 2020-09-20 MED ORDER — VITAMIN D3 1.25 MG (50000 UT) PO CAPS: 1.2500 mg | ORAL_CAPSULE | ORAL | 0 refills | Status: AC

## 2020-09-20 NOTE — Telephone Encounter (Signed)
Discussed low vitamin D and other blood work with patient.  Initiating on vitamin D supplement 50,000 units weekly x8 weeks followed by 1000 units daily.  Follow-up for recheck in 3 months.

## 2020-10-11 ENCOUNTER — Ambulatory Visit: Payer: Managed Care, Other (non HMO) | Attending: Emergency Medicine | Admitting: Physical Therapy

## 2020-11-14 ENCOUNTER — Ambulatory Visit: Payer: Managed Care, Other (non HMO) | Admitting: Medical

## 2020-11-14 ENCOUNTER — Encounter: Payer: Self-pay | Admitting: Medical

## 2020-11-14 ENCOUNTER — Other Ambulatory Visit: Payer: Self-pay

## 2020-11-14 VITALS — BP 110/70 | HR 73 | Temp 98.3°F | Resp 18 | Ht 69.0 in | Wt 166.4 lb

## 2020-11-14 DIAGNOSIS — H6123 Impacted cerumen, bilateral: Secondary | ICD-10-CM

## 2020-11-14 DIAGNOSIS — J3089 Other allergic rhinitis: Secondary | ICD-10-CM

## 2020-11-14 DIAGNOSIS — L819 Disorder of pigmentation, unspecified: Secondary | ICD-10-CM | POA: Diagnosis not present

## 2020-11-14 DIAGNOSIS — E559 Vitamin D deficiency, unspecified: Secondary | ICD-10-CM

## 2020-11-14 DIAGNOSIS — E7849 Other hyperlipidemia: Secondary | ICD-10-CM

## 2020-11-14 DIAGNOSIS — H9203 Otalgia, bilateral: Secondary | ICD-10-CM

## 2020-11-14 DIAGNOSIS — D649 Anemia, unspecified: Secondary | ICD-10-CM

## 2020-11-14 MED ORDER — FLUTICASONE PROPIONATE 50 MCG/ACT NA SUSP: 2.0000 | Freq: Every day | NASAL | 4 refills | Status: DC

## 2020-11-14 NOTE — Progress Notes (Signed)
Subjective:    Patient ID: April Rodgers, female    DOB: 1986-01-08, 35 y.o.   MRN: [id number]  HPI  Pt in for first time.  Pt works Building services engineer. Pt walks for exercise about 2-3 times a week. Pt states she is eating healthy. 2 sodas in a week. Ginger ale or vanilla coke. Coffee once a week. Non smoker. No alcohol use. Has 3 children.   History of anemia and mvp.   Pt indicated this summer got wellness exam thru her work. I don't have those labs. Pt showed me her labs on phone. No diabetes. Normal metabolic panel. Only mild ldl elevation.  Pt had video visit on Thursday. Had upper respiratory infection symptoms allergies. Given xyzal. Given azithromycin for ear infection/ear pain.. signs and symptoms present for 9 days. Pt  states that her ear feels less painful since starting the antibiotic. Pt on last day of antibiotic zpack today.  LMP- sept 21, 2022. Not breast feeding.  Review of Systems  Constitutional:  Negative for chills, fatigue and fever.  HENT:  Positive for congestion, ear pain and postnasal drip.   Respiratory:  Negative for cough, chest tightness, shortness of breath and wheezing.   Cardiovascular:  Negative for chest pain and palpitations.  Gastrointestinal:  Negative for anal bleeding.  Genitourinary:  Negative for dysuria, flank pain and frequency.  Skin:  Negative for rash.       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   History of chorioamnionitis    03-10-2015   History of fetal loss    03-10-2015  non-viable fetal demise twins at [redacted]w[redacted]d due to chorioamnionitis infection with PPROM   Hx of varicella    Hydrosalpinx    RIGHT   Migraine    Normal exercise tolerance test results    03-22-2006   Obstruction  of fallopian tube    bilateral occlusion   Scoliosis      Social History   Socioeconomic History   Marital status: Married    Spouse name: Not on file   Number of children: Not on file   Years of education: Not on file   Highest education level: Not on file  Occupational History   Not on file  Tobacco Use   Smoking status: Never   Smokeless tobacco: Never  Substance and Sexual Activity   Alcohol use: No   Drug use: No   Sexual activity: Yes  Other Topics Concern   Not on file  Social History Narrative   Not on file   Social Determinants of Health   Financial Resource Strain: Not on file  Food Insecurity: Not on file  Transportation Needs: Not on file  Physical Activity: Not on file  Stress: Not on file  Social Connections: Not on file  Intimate Partner Violence: Not on file    Past Surgical History:  Procedure Laterality Date   DILATION AND CURETTAGE OF UTERUS  2006   HYSTEROSCOPY WITH D & C N/A 06/28/2015   Procedure: SUCTION DILATATION AND CURETTAGE /HYSTEROSCOPY, ;  Surgeon: Fermin Schwab, MD;  Location: [facility];  Service: Gynecology;  Laterality: N/A;   LAPAROSCOPY Left 06/28/2015  Procedure: LAPAROSCOPY LYSIS OF ADHESIONS, RIGHT SALPINGECTOMY WITH NOVY CATHERIZATION OF LEFT TUBE,EXCISION OF ENDOMETROSIS;  Surgeon: Fermin Schwab, MD;  Location: [facility];  Service: Gynecology;  Laterality: Left;   TONSILLECTOMY  2012   WISDOM TOOTH EXTRACTION  2016    Family History  Problem Relation Age of Onset   Anemia Mother     Allergies  Allergen Reactions   Penicillins Hives and Rash    Has patient had a PCN reaction causing immediate rash, facial/tongue/throat swelling, SOB or lightheadedness with hypotension: Yes Has patient had a PCN reaction causing severe rash involving mucus membranes or skin necrosis: No Has patient had a PCN reaction that required hospitalization No Has patient had a PCN reaction occurring within  the last 10 years: No If all of the above answers are "NO", then may proceed with Cephalosporin use.     Current Outpatient Medications on File Prior to Visit  Medication Sig Dispense Refill   acetaminophen (TYLENOL) 500 MG tablet Take 1,000 mg by mouth every 6 (six) hours as needed for moderate pain or fever.     Prenatal Vit-Fe Fumarate-FA (PRENATAL MULTIVITAMIN) TABS tablet Take 1 tablet by mouth daily at 12 noon.     sertraline (ZOLOFT) 50 MG tablet Take 50 mg by mouth daily.     No current facility-administered medications on file prior to visit.    BP 110/70   Pulse 73   Temp 98.3 F (36.8 C)   Resp 18   Ht 5\' 9"  (1.753 m)   Wt 166 lb 6.4 oz (75.5 kg)   LMP 10/26/2020   SpO2 100%   BMI 24.57 kg/m        Objective:   Physical Exam  General Mental Status- Alert. General Appearance- Not in acute distress.   Skin General: Color- Normal Color. Moisture- Normal Moisture.  Neck Carotid Arteries- Normal color. Moisture- Normal Moisture. No carotid bruits. No JVD.  Chest and Lung Exam Auscultation: Breath Sounds:-Normal.  Cardiovascular Auscultation:Rythm- Regular. Murmurs & Other Heart Sounds:Auscultation of the heart reveals- No Murmurs.  Abdomen Inspection:-Inspeection Normal. Palpation/Percussion:Note:No mass. Palpation and Percussion of the abdomen reveal- Non Tender, Non Distended + BS, no rebound or guarding.  Neurologic Cranial Nerve exam:- CN III-XII intact(No nystagmus), symmetric smile. Strength:- 5/5 equal and symmetric strength both upper and lower extremities.    Heent- boggy turbinates red and swollen. Allergic shiners on exam. Pnd. No tonsil hypertrophy. Wax both canals. Small portion of tm seen on both sides area normal.  Lower lip dark spots/hyperpitmentation since august. Pt has been vit E and cocconut oil. Has not helped.     Assessment & Plan:   Patient Instructions  Nice to meet you.  For recent probable allergic rhinitis with  secondary ear infection, continue xyzal and azithromycin. I am adding flonase nasal spray since some of pain may be from eustachian tube presssure.  You have wax blocking view of tm presently. Hopefully your ear pain will resolve. If so then can use debrox to soften wax then return for ear lavage. If your ear pain worsens notify September but currently on azithromycin. If pain worsens may need additional antibiotic.  Mid ldl elevation only recommend low cholesterol diet.  For lips hyperpigmentation refer to dermatologist.  For history of anemia get cbc today.  Follow up 2 weeks or sooner if needed.   [place], PA-C

## 2020-11-14 NOTE — Patient Instructions (Addendum)
Nice to meet you.  For recent probable allergic rhinitis with secondary ear infection, continue xyzal and azithromycin. I am adding flonase nasal spray since some of pain may be from eustachian tube presssure.  You have wax blocking view of tm presently. Hopefully your ear pain will resolve. If so then can use debrox to soften wax then return for ear lavage. If your ear pain worsens notify us but currently on azithromycin. If pain worsens may need additional antibiotic.  Mid ldl elevation only recommend low cholesterol diet.  For lips hyperpigmentation refer to dermatologist.  For history of anemia get cbc today.  For low vit d recheck vit d level today.  Follow up 2 weeks or sooner if needed.

## 2020-11-16 NOTE — Addendum Note (Signed)
Addended by: Gwenevere Abbot on: 11/16/2020 10:51 PM   Modules accepted: Orders

## 2020-11-17 ENCOUNTER — Encounter: Payer: Self-pay | Admitting: Medical

## 2020-11-18 ENCOUNTER — Telehealth: Payer: Self-pay | Admitting: Medical

## 2020-11-18 MED ORDER — AZITHROMYCIN 250 MG PO TABS: ORAL_TABLET | ORAL | 0 refills | Status: AC

## 2020-11-18 NOTE — Telephone Encounter (Signed)
Rx zpack send to pt pharmacy.  Esperanza Richters, PA-C

## 2020-11-24 LAB — CBC WITH DIFFERENTIAL/PLATELET
Basophils Absolute: 0 10*3/uL (ref 0.0–0.2)
Basos: 0 %
EOS (ABSOLUTE): 0 10*3/uL (ref 0.0–0.4)
Eos: 0 %
Hematocrit: 32.9 % — ABNORMAL LOW (ref 34.0–46.6)
Hemoglobin: 10.5 g/dL — ABNORMAL LOW (ref 11.1–15.9)
Immature Grans (Abs): 0 10*3/uL (ref 0.0–0.1)
Immature Granulocytes: 0 %
Lymphocytes Absolute: 2.2 10*3/uL (ref 0.7–3.1)
Lymphs: 49 %
MCH: 24.6 pg — ABNORMAL LOW (ref 26.6–33.0)
MCHC: 31.9 g/dL (ref 31.5–35.7)
MCV: 77 fL — ABNORMAL LOW (ref 79–97)
Monocytes Absolute: 0.5 10*3/uL (ref 0.1–0.9)
Monocytes: 11 %
Neutrophils Absolute: 1.8 10*3/uL (ref 1.4–7.0)
Neutrophils: 40 %
Platelets: 275 10*3/uL (ref 150–450)
RBC: 4.26 x10E6/uL (ref 3.77–5.28)
RDW: 19.2 % — ABNORMAL HIGH (ref 11.7–15.4)
WBC: 4.5 10*3/uL (ref 3.4–10.8)

## 2020-11-24 LAB — VITAMIN D 1,25 DIHYDROXY
Vitamin D 1, 25 (OH)2 Total: 91 pg/mL — ABNORMAL HIGH
Vitamin D2 1, 25 (OH)2: <10 pg/mL
Vitamin D3 1, 25 (OH)2: 89 pg/mL

## 2020-11-25 ENCOUNTER — Ambulatory Visit: Payer: Managed Care, Other (non HMO) | Admitting: Medical

## 2020-11-25 ENCOUNTER — Other Ambulatory Visit: Payer: Self-pay

## 2020-11-25 VITALS — BP 111/59 | HR 73 | Temp 98.3°F | Resp 18 | Ht 69.0 in | Wt 166.0 lb

## 2020-11-25 DIAGNOSIS — H6122 Impacted cerumen, left ear: Secondary | ICD-10-CM | POA: Diagnosis not present

## 2020-11-25 DIAGNOSIS — H6690 Otitis media, unspecified, unspecified ear: Secondary | ICD-10-CM

## 2020-11-25 MED ORDER — DOXYCYCLINE HYCLATE 100 MG PO TABS: 100.0000 mg | ORAL_TABLET | Freq: Two times a day (BID) | ORAL | 0 refills | Status: DC

## 2020-11-25 NOTE — Patient Instructions (Signed)
Persisting right side otitis media/ear infection.  It appears that you successfully removed wax present from the right side.  Failed 2 rounds of azithromycin antibiotic.  Severe allergy to penicillin and no documented history of using cephalosporin.  So decided to prescribe doxycycline antibiotic.  Rx advisement given.  Want you to follow-up in 7 days to recheck your.  If still having discomfort or if that looks still infected then will refer you to ENT.  Rx advisement given regarding doxycycline use.  No oral contraceptive use presently.  Did advise a backup method while using doxycycline.  Left side cerumen impaction.  Medical assistant lavaged you today but failed attempts since she reported the pain medical assistant stopped.  Recommend that she continue Debrox over-the-counter.  Follow-up in 7 days or sooner if needed.

## 2020-11-25 NOTE — Progress Notes (Signed)
Subjective:    Patient ID: April Rodgers, female    DOB: 1985/06/14, 35 y.o.   MRN: [id number]  HPI  Pt in for follow up.  Pt seen by video visit initially by other provider then me on 11-14-20.   Pt given zpack for rt ear pain. I saw her when she was on day 4 of zpack.  That day her heent  showed below in ".   Heent- boggy turbinates red and swollen. Allergic shiners on exam. Pnd. No tonsil hypertrophy. Wax both canals. Small portion of tm seen on both sides area normal.  Pt sent message reporting persisting pain and I sent in zpack refill pending follow up.   Pt has not been using q tips to either ear. She saw some wax on pillow the other day.    Pt has severe rxn to pcn in the past. No record that I can see that she has been on cephalasporin.  Lmp- this Tuesday and was normal. Came on when expected.   Pt still has rt ear pain. No left ear pain presently.   Review of Systems  Constitutional:  Negative for chills, fatigue and fever.  HENT:  Positive for ear pain. Negative for congestion.        Rt side.  Respiratory:  Negative for cough, chest tightness and wheezing.   Cardiovascular:  Negative for chest pain and palpitations.  Gastrointestinal:  Negative for abdominal pain.  Genitourinary:  Negative for dyspareunia, dysuria and frequency.    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   History of chorioamnionitis    03-10-2015   History of fetal loss    03-10-2015  non-viable fetal demise twins at [redacted]w[redacted]d due to chorioamnionitis infection with PPROM   Hx of varicella    Hydrosalpinx    RIGHT   Migraine    Normal exercise tolerance test results    03-22-2006   Obstruction of fallopian tube    bilateral occlusion   Scoliosis      Social  History   Socioeconomic History   Marital status: Married    Spouse name: Not on file   Number of children: Not on file   Years of education: Not on file   Highest education level: Not on file  Occupational History   Not on file  Tobacco Use   Smoking status: Never   Smokeless tobacco: Never  Substance and Sexual Activity   Alcohol use: No   Drug use: No   Sexual activity: Yes  Other Topics Concern   Not on file  Social History Narrative   Not on file   Social Determinants of Health   Financial Resource Strain: Not on file  Food Insecurity: Not on file  Transportation Needs: Not on file  Physical Activity: Not on file  Stress: Not on file  Social Connections: Not on file  Intimate Partner Violence: Not on file    Past Surgical History:  Procedure Laterality Date   DILATION AND CURETTAGE OF UTERUS  2006   HYSTEROSCOPY WITH D & C N/A 06/28/2015   Procedure: SUCTION DILATATION AND CURETTAGE /HYSTEROSCOPY, ;  Surgeon: Fermin Schwab, MD;  Location: [facility];  Service: Gynecology;  Laterality: N/A;   LAPAROSCOPY Left 06/28/2015   Procedure: LAPAROSCOPY LYSIS OF ADHESIONS, RIGHT SALPINGECTOMY WITH NOVY CATHERIZATION OF LEFT TUBE,EXCISION OF ENDOMETROSIS;  Surgeon: Fermin Schwab, MD;  Location: [facility];  Service: Gynecology;  Laterality: Left;   TONSILLECTOMY  2012   WISDOM TOOTH EXTRACTION  2016    Family History  Problem Relation Age of Onset   Anemia Mother     Allergies  Allergen Reactions   Penicillins Hives and Rash    Has patient had a PCN reaction causing immediate rash, facial/tongue/throat swelling, SOB or lightheadedness with hypotension: Yes Has patient had a PCN reaction causing severe rash involving mucus membranes or skin necrosis: No Has patient had a PCN reaction that required hospitalization No Has patient had a PCN reaction occurring within the last 10 years: No If all of the above answers are "NO", then may  proceed with Cephalosporin use.     Current Outpatient Medications on File Prior to Visit  Medication Sig Dispense Refill   acetaminophen (TYLENOL) 500 MG tablet Take 1,000 mg by mouth every 6 (six) hours as needed for moderate pain or fever.     fluticasone (FLONASE) 50 MCG/ACT nasal spray Place 2 sprays into both nostrils daily. 16 g 4   Prenatal Vit-Fe Fumarate-FA (PRENATAL MULTIVITAMIN) TABS tablet Take 1 tablet by mouth daily at 12 noon.     sertraline (ZOLOFT) 50 MG tablet Take 50 mg by mouth daily.     No current facility-administered medications on file prior to visit.    BP (!) 111/59   Pulse 73   Temp 98.3 F (36.8 C)   Resp 18   Ht 5\' 9"  (1.753 m)   Wt 166 lb (75.3 kg)   LMP 11/22/2020   SpO2 96%   BMI 24.51 kg/m       Objective:   Physical Exam  General- No acute distress. Pleasant patient. Neck- Full range of motion, no jvd Lungs- Clear, even and unlabored. Heart- regular rate and rhythm. Neurologic- CNII- XII grossly intact.   Heent- no frontal or maxillary sinus pressure.  Left tm still red centrally. Canal clear no wax. Rt canal small to moderate amount of wax. Debrox just used so obscurred view. No portion of tm seen.   Post lavage on left side wax still present.  Cannot see the TM.  No blood seen/trauma seen.      Assessment & Plan:   Patient Instructions  Persisting right side otitis media/ear infection.  It appears that you successfully removed wax present from the right side.  Failed 2 rounds of azithromycin antibiotic.  Severe allergy to penicillin and no documented history of using cephalosporin.  So decided to prescribe doxycycline antibiotic.  Rx advisement given.  Want you to follow-up in 7 days to recheck your.  If still having discomfort or if that looks still infected then will refer you to ENT.  Rx advisement given regarding doxycycline use.  No oral contraceptive use presently.  Did advise a backup method while using doxycycline.  Left  side cerumen impaction.  Medical assistant lavaged you today but failed attempts since she reported the pain medical assistant stopped.  Recommend that she continue Debrox over-the-counter.  Follow-up in 7 days or sooner if needed.   11/24/2020, PA-C   Time spent with patient today was 23  minutes which consisted of chart revdew, discussing diagnosis, work up treatment and documentation.

## 2020-11-28 NOTE — Addendum Note (Signed)
Addended by: Gwenevere Abbot on: 11/28/2020 08:41 PM   Modules accepted: Orders

## 2020-11-29 MED ORDER — LEVOFLOXACIN 500 MG PO TABS: 500.0000 mg | ORAL_TABLET | Freq: Every day | ORAL | 0 refills | Status: AC

## 2020-11-29 NOTE — Addendum Note (Signed)
Addended by: Gwenevere Abbot on: 11/29/2020 08:12 AM   Modules accepted: Orders

## 2020-12-12 NOTE — Addendum Note (Signed)
Addended by: Mervin Kung A on: 12/12/2020 02:36 PM   Modules accepted: Orders

## 2020-12-14 ENCOUNTER — Other Ambulatory Visit: Payer: Managed Care, Other (non HMO)

## 2020-12-16 ENCOUNTER — Other Ambulatory Visit: Payer: Self-pay

## 2020-12-16 ENCOUNTER — Other Ambulatory Visit (INDEPENDENT_AMBULATORY_CARE_PROVIDER_SITE_OTHER): Payer: Managed Care, Other (non HMO)

## 2020-12-16 DIAGNOSIS — D649 Anemia, unspecified: Secondary | ICD-10-CM

## 2020-12-17 LAB — IRON: Iron: 104 ug/dL (ref 27–159)

## 2020-12-17 LAB — CBC WITH DIFFERENTIAL/PLATELET
Basophils Absolute: 0 10*3/uL (ref 0.0–0.2)
Basos: 1 %
EOS (ABSOLUTE): 0 10*3/uL (ref 0.0–0.4)
Eos: 0 %
Hematocrit: 33.6 % — ABNORMAL LOW (ref 34.0–46.6)
Hemoglobin: 10.7 g/dL — ABNORMAL LOW (ref 11.1–15.9)
Immature Grans (Abs): 0 10*3/uL (ref 0.0–0.1)
Immature Granulocytes: 0 %
Lymphocytes Absolute: 1.8 10*3/uL (ref 0.7–3.1)
Lymphs: 41 %
MCH: 25 pg — ABNORMAL LOW (ref 26.6–33.0)
MCHC: 31.8 g/dL (ref 31.5–35.7)
MCV: 79 fL (ref 79–97)
Monocytes Absolute: 0.5 10*3/uL (ref 0.1–0.9)
Monocytes: 12 %
Neutrophils Absolute: 2.1 10*3/uL (ref 1.4–7.0)
Neutrophils: 46 %
Platelets: 265 10*3/uL (ref 150–450)
RBC: 4.28 x10E6/uL (ref 3.77–5.28)
RDW: 19.1 % — ABNORMAL HIGH (ref 11.7–15.4)
WBC: 4.5 10*3/uL (ref 3.4–10.8)

## 2020-12-19 ENCOUNTER — Telehealth: Payer: Self-pay | Admitting: Medical

## 2020-12-19 MED ORDER — FLUCONAZOLE 150 MG PO TABS: ORAL_TABLET | ORAL | 0 refills | Status: DC

## 2020-12-19 NOTE — Telephone Encounter (Signed)
Chart opened to rx med, order lab, review chart, respond to my chart message or send message to staff member  

## 2020-12-21 ENCOUNTER — Other Ambulatory Visit: Payer: Managed Care, Other (non HMO)

## 2020-12-21 DIAGNOSIS — D649 Anemia, unspecified: Secondary | ICD-10-CM

## 2020-12-22 ENCOUNTER — Other Ambulatory Visit (INDEPENDENT_AMBULATORY_CARE_PROVIDER_SITE_OTHER): Payer: Managed Care, Other (non HMO)

## 2020-12-22 DIAGNOSIS — D649 Anemia, unspecified: Secondary | ICD-10-CM | POA: Diagnosis not present

## 2020-12-22 LAB — FECAL OCCULT BLOOD, IMMUNOCHEMICAL: Fecal Occult Bld: NEGATIVE

## 2021-05-03 ENCOUNTER — Other Ambulatory Visit: Payer: Self-pay

## 2021-05-03 ENCOUNTER — Telehealth: Payer: Self-pay

## 2021-05-03 ENCOUNTER — Encounter (HOSPITAL_BASED_OUTPATIENT_CLINIC_OR_DEPARTMENT_OTHER): Payer: Self-pay | Admitting: Emergency Medicine

## 2021-05-03 ENCOUNTER — Emergency Department (HOSPITAL_BASED_OUTPATIENT_CLINIC_OR_DEPARTMENT_OTHER)
Admission: EM | Admit: 2021-05-03 | Discharge: 2021-05-03 | Disposition: A | Discharge status: Home / Self Care | Payer: Managed Care, Other (non HMO) | Attending: Emergency Medicine | Admitting: Emergency Medicine

## 2021-05-03 ENCOUNTER — Ambulatory Visit: Payer: Self-pay

## 2021-05-03 DIAGNOSIS — Z20822 Contact with and (suspected) exposure to covid-19: Secondary | ICD-10-CM | POA: Diagnosis not present

## 2021-05-03 DIAGNOSIS — M545 Low back pain, unspecified: Secondary | ICD-10-CM | POA: Diagnosis not present

## 2021-05-03 DIAGNOSIS — J029 Acute pharyngitis, unspecified: Secondary | ICD-10-CM | POA: Diagnosis present

## 2021-05-03 DIAGNOSIS — J020 Streptococcal pharyngitis: Secondary | ICD-10-CM | POA: Diagnosis not present

## 2021-05-03 LAB — RESP PANEL BY RT-PCR (FLU A&B, COVID) ARPGX2
Influenza A by PCR: NEGATIVE
Influenza B by PCR: NEGATIVE
SARS Coronavirus 2 by RT PCR: NEGATIVE

## 2021-05-03 LAB — GROUP A STREP BY PCR: Group A Strep by PCR: DETECTED — AB

## 2021-05-03 MED ORDER — SODIUM CHLORIDE 0.9 % IV BOLUS
1000.0000 mL | Freq: Once | INTRAVENOUS | Status: AC
  Administered 2021-05-03: 1000 mL via INTRAVENOUS

## 2021-05-03 MED ORDER — AZITHROMYCIN 250 MG PO TABS
500.0000 mg | ORAL_TABLET | Freq: Once | ORAL | Status: AC
  Administered 2021-05-03: 500 mg via ORAL
  Filled 2021-05-03: qty 2

## 2021-05-03 MED ORDER — ACETAMINOPHEN 325 MG PO TABS
650.0000 mg | ORAL_TABLET | Freq: Once | ORAL | Status: AC | PRN
  Administered 2021-05-03: 650 mg via ORAL

## 2021-05-03 MED ORDER — AZITHROMYCIN 250 MG PO TABS: 250.0000 mg | ORAL_TABLET | Freq: Every day | ORAL | 0 refills | Status: DC

## 2021-05-03 NOTE — ED Triage Notes (Signed)
Patient arrives ambulatory by POV c/o sore throat and painful swallowing x 2 days. Patient reports headaches, chills, and lower back aching. Has not tried any OTC meds.  ?

## 2021-05-03 NOTE — Telephone Encounter (Signed)
Nurse Assessment ?Nurse: Leilani Merl, RN, Heather Date/Time (Eastern Time): 05/03/2021 8:32:05 AM ?Confirm and document reason for call. If ?symptomatic, describe symptoms. ?---Caller states that she started with a sore throat, ?headache and chills 2 days ago. ?Does the patient have any new or worsening ?symptoms? ---Yes ?Will a triage be completed? ---Yes ?Related visit to physician within the last 2 weeks? ---No ?Does the PT have any chronic conditions? (i.e. ?diabetes, asthma, this includes High risk factors for ?pregnancy, etc.) ?---Yes ?List chronic conditions. ---heart murmur, anxiety. ?Is the patient pregnant or possibly pregnant? (Ask ?all females between the ages of 55-55) ---No ?Is this a behavioral health or substance abuse call? ---No ?Guidelines ?Guideline Title Affirmed Question Affirmed Notes Nurse Date/Time (Eastern ?Time) ?Sore Throat [1] Drooling or ?spitting out saliva ?(because can't ?swallow) AND [2] ?normal breathing ?Standifer, Therapist, sports, ?Heather ?05/03/2021 8:32:34 ?AM ?Disp. Time (Eastern ?Time) Disposition Final User ?05/03/2021 8:34:47 AM Go to ED Now Yes Standifer, RN, Nira Conn ?PLEASE NOTE: All timestamps contained within this report are represented as [place] Standard Time. ?CONFIDENTIALTY NOTICE: This fax transmission is intended only for the addressee. It contains information that is legally privileged, confidential or ?otherwise protected from use or disclosure. If you are not the intended recipient, you are strictly prohibited from reviewing, disclosing, copying using ?or disseminating any of this information or taking any action in reliance on or regarding this information. If you have received this fax in error, please ?notify us immediately by telephone so that we can arrange for its return to [place]. Phone: [phone], Toll-Free: [phone], Fax: [phone] ?Page: 2 of 2 ?Call Id: [id number] ?Caller Disagree/Comply Comply ?Caller Understands Yes ?PreDisposition Call Doctor ?Care Advice  Given Per Guideline ?GO TO ED NOW: * You need to be seen in the Emergency Department. CARE ADVICE given per Sore Throat (Adult) guideline. ?Referrals ?GO TO FACILITY UNDECIDED ?

## 2021-05-03 NOTE — Discharge Instructions (Addendum)
You tested positive for strep throat today.  This is likely the cause of her symptoms.  We have given you antibiotics in the emergency department which will cover you for today.  Starting tomorrow, I would like for you to take 250 mg of azithromycin daily for the next 4 days.  Return to the emergency department for any worsening symptoms. ?

## 2021-05-03 NOTE — ED Provider Notes (Signed)
?[facility] ?Provider Note ? ? ?CSN: [id number] ?Arrival date & time: 05/03/21  [id number] ? ?  ? ?History ?Chief Complaint  ?Patient presents with  ? Sore Throat  ? Back Pain  ? ? ?April Rodgers is a 36 y.o. female with history of tonsillectomy back in 2012 who presents the emergency department with a 2-day history of sore throat, general myalgias, low back pain, and headache.  She denies any sick contacts.  She denies fever but does endorse chills last night.  No nausea, vomiting, diarrhea.  No urinary complaints.  Patient states that she was having some trouble swallowing secondary to pain but was able to tolerate p.o. in the department today. ? ? ?Sore Throat ? ?Back Pain ? ?  ? ?Home Medications ?Prior to Admission medications   ?Medication Sig Start Date End Date Taking? Authorizing Provider  ?azithromycin (ZITHROMAX) 250 MG tablet Take 1 tablet (250 mg total) by mouth daily. Take 250mg  once daily for the next 4 days starting on 05/04/21. 05/03/21  Yes Raul Del, Tauren Delbuono M, PA-C  ?acetaminophen (TYLENOL) 500 MG tablet Take 1,000 mg by mouth every 6 (six) hours as needed for moderate pain or fever.    [provider]  ?fluconazole (DIFLUCAN) 150 MG tablet 1 tab po q day 12/19/20   Saguier, Percell Miller, PA-C  ?fluticasone (FLONASE) 50 MCG/ACT nasal spray Place 2 sprays into both nostrils daily. 11/14/20   Saguier, Percell Miller, PA-C  ?Prenatal Vit-Fe Fumarate-FA (PRENATAL MULTIVITAMIN) TABS tablet Take 1 tablet by mouth daily at 12 noon.    [provider]  ?sertraline (ZOLOFT) 50 MG tablet Take 50 mg by mouth daily. 07/29/20   [provider]  ?   ? ?Allergies    ?Penicillins   ? ?Review of Systems   ?Review of Systems  ?Musculoskeletal:  Positive for back pain.  ?All other systems reviewed and are negative. ? ?Physical Exam ?Updated Vital Signs ?BP 135/74 (BP Location: Right Arm)   Pulse 98   Temp 98.9 ?F (37.2 ?C) (Oral)   Resp 16   Ht 5\' 9"  (1.753 m)   Wt 73.9 kg    LMP 04/30/2021   SpO2 100%   BMI 24.07 kg/m?  ?Physical Exam ?Vitals and nursing note reviewed.  ?Constitutional:   ?   Appearance: Normal appearance.  ?HENT:  ?   Head: Normocephalic and atraumatic.  ?   Mouth/Throat:  ?   Comments: Tonsils absent.  Uvula is midline.  There is mild posterior pharyngeal erythema.  Tongue is normal and not swollen.  Uvula not swollen.  Good dentition. ?Eyes:  ?   General:     ?   Right eye: No discharge.     ?   Left eye: No discharge.  ?   Conjunctiva/sclera: Conjunctivae normal.  ?Pulmonary:  ?   Effort: Pulmonary effort is normal.  ?Skin: ?   General: Skin is warm and dry.  ?   Findings: No rash.  ?Neurological:  ?   General: No focal deficit present.  ?   Mental Status: She is alert.  ?Psychiatric:     ?   Mood and Affect: Mood normal.     ?   Behavior: Behavior normal.  ? ? ?ED Results / Procedures / Treatments   ?Labs ?(all labs ordered are listed, but only abnormal results are displayed) ?Labs Reviewed  ?GROUP A STREP BY PCR - Abnormal; Notable for the following components:  ?    Result Value  ?  Group A Strep by PCR DETECTED (*)   ? All other components within normal limits  ?RESP PANEL BY RT-PCR (FLU A&B, COVID) [id number]  ? ? ?EKG ?None ? ?Radiology ?No results found. ? ?Procedures ?Procedures  ? ? ?Medications Ordered in ED ?Medications  ?azithromycin (ZITHROMAX) tablet 500 mg (has no administration in time range)  ?acetaminophen (TYLENOL) tablet 650 mg (650 mg Oral Given 05/03/21 0904)  ?sodium chloride 0.9 % bolus 1,000 mL (1,000 mLs Intravenous New Bag/Given 05/03/21 0951)  ? ? ?ED Course/ Medical Decision Making/ A&P ?  ?                        ?Medical Decision Making ?Risk ?OTC drugs. ? ? ?April Rodgers is a 36 y.o. female who presents to the emergency department with a sore throat.  There is posterior pharyngeal erythema and obviously strep pharyngitis versus viral pharyngitis is on my differential.  I will low suspicion for upper respiratory infection as patient  has not had any cough.  I doubt pneumonia.  Doubt pneumothorax.  I doubt epiglottitis.  Low suspicion for sinusitis.  I have personally ordered and interpreted labs including strep and respiratory panel.  Strep was positive.  COVID and flu were negative.  Patient does have a penicillin allergy.  I talked with patient about this and she states that she gets an anaphylactic reaction.  Will avoid penicillins. Will treat with Azithromycin. Strict return precaution given she is safe for discharge.  ? ?Final Clinical Impression(s) / ED Diagnoses ?Final diagnoses:  ?Strep pharyngitis  ? ? ?Rx / DC Orders ?ED Discharge Orders   ? ?      Ordered  ?  azithromycin (ZITHROMAX) 250 MG tablet  Daily       ? 05/03/21 1029  ? ?  ?  ? ?  ? ? ?  ?Hendricks Limes, PA-C ?05/03/21 1030 ? ?  ?Fredia Sorrow, MD ?05/04/21 [phone] ? ?

## 2021-07-04 ENCOUNTER — Ambulatory Visit (HOSPITAL_BASED_OUTPATIENT_CLINIC_OR_DEPARTMENT_OTHER)
Admission: RE | Admit: 2021-07-04 | Discharge: 2021-07-04 | Disposition: A | Discharge status: Home / Self Care | Payer: Managed Care, Other (non HMO) | Source: Ambulatory Visit | Attending: Medical | Admitting: Medical

## 2021-07-04 ENCOUNTER — Ambulatory Visit: Payer: Managed Care, Other (non HMO) | Admitting: Medical

## 2021-07-04 VITALS — BP 120/77 | HR 81 | Temp 97.9°F | Resp 16 | Ht 69.0 in | Wt 157.6 lb

## 2021-07-04 DIAGNOSIS — M545 Low back pain, unspecified: Secondary | ICD-10-CM

## 2021-07-04 DIAGNOSIS — R5383 Other fatigue: Secondary | ICD-10-CM

## 2021-07-04 DIAGNOSIS — D649 Anemia, unspecified: Secondary | ICD-10-CM | POA: Diagnosis not present

## 2021-07-04 DIAGNOSIS — M2550 Pain in unspecified joint: Secondary | ICD-10-CM | POA: Diagnosis not present

## 2021-07-04 DIAGNOSIS — G43009 Migraine without aura, not intractable, without status migrainosus: Secondary | ICD-10-CM

## 2021-07-04 DIAGNOSIS — E559 Vitamin D deficiency, unspecified: Secondary | ICD-10-CM | POA: Diagnosis not present

## 2021-07-04 MED ORDER — SUMATRIPTAN SUCCINATE 50 MG PO TABS: 50.0000 mg | ORAL_TABLET | ORAL | 0 refills | Status: DC | PRN

## 2021-07-04 NOTE — Progress Notes (Signed)
Subjective:    Patient ID: April Rodgers, female    DOB: 08/09/1985, 36 y.o.   MRN: [id number]  HPI Reports extreme fatigue recently for past 2 months . On review has anemia and vit D deficiency.  Diet- states healthy. Exercise- states walking 4 days a week for exercise half a mil. Sleep- 5 nights a week get adequate. Has 3 children.     Also reporting pain to back of hand and feet. Also some lower back pain.  Hand pain for 3 month. Feet pain for 3 months.  Back pain for one year. No injury or fall. No radicular pain. Pain in both mid SI and middle lspine.   LMP- May, 9, 2023.   Pt states some ha migraine like usually one a year. Light sensitivite, nausea and 8/10 level ha. In past takes ibuprofen and sleeps ha off. Recently 3 times in past month.   Review of Systems  Constitutional:  Positive for fatigue. Negative for chills and fever.  Respiratory:  Negative for cough, choking, shortness of breath and wheezing.   Cardiovascular:  Negative for chest pain and palpitations.  Gastrointestinal:  Negative for abdominal pain and constipation.  Musculoskeletal:  Positive for arthralgias. Negative for myalgias, neck pain and neck stiffness.  Skin:  Negative for rash.  Neurological:  Negative for dizziness and headaches.  Hematological:  Negative for adenopathy. Does not bruise/bleed easily.  Psychiatric/Behavioral:  Negative for behavioral problems and decreased concentration.     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   History of chorioamnionitis    03-10-2015   History of fetal loss    03-10-2015  non-viable fetal demise twins at [redacted]w[redacted]d due to chorioamnionitis infection with PPROM   Hx of varicella    Hydrosalpinx    RIGHT   Migraine    Normal  exercise tolerance test results    03-22-2006   Obstruction of fallopian tube    bilateral occlusion   Scoliosis      Social History   Socioeconomic History   Marital status: Married    Spouse name: Not on file   Number of children: Not on file   Years of education: Not on file   Highest education level: Not on file  Occupational History   Not on file  Tobacco Use   Smoking status: Never   Smokeless tobacco: Never  Substance and Sexual Activity   Alcohol use: No   Drug use: No   Sexual activity: Yes  Other Topics Concern   Not on file  Social History Narrative   Not on file   Social Determinants of Health   Financial Resource Strain: Not on file  Food Insecurity: Not on file  Transportation Needs: Not on file  Physical Activity: Not on file  Stress: Not on file  Social Connections: Not on file  Intimate Partner Violence: Not on file    Past Surgical History:  Procedure Laterality Date   DILATION AND CURETTAGE OF UTERUS  2006   HYSTEROSCOPY WITH D & C N/A 06/28/2015   Procedure: SUCTION DILATATION AND CURETTAGE /HYSTEROSCOPY, ;  Surgeon: Fermin Schwab, MD;  Location: [facility];  Service: Gynecology;  Laterality: N/A;  LAPAROSCOPY Left 06/28/2015   Procedure: LAPAROSCOPY LYSIS OF ADHESIONS, RIGHT SALPINGECTOMY WITH NOVY CATHERIZATION OF LEFT TUBE,EXCISION OF ENDOMETROSIS;  Surgeon: Fermin Schwab, MD;  Location: [facility];  Service: Gynecology;  Laterality: Left;   TONSILLECTOMY  2012   WISDOM TOOTH EXTRACTION  2016    Family History  Problem Relation Age of Onset   Anemia Mother     Allergies  Allergen Reactions   Penicillins Hives and Rash    Has patient had a PCN reaction causing immediate rash, facial/tongue/throat swelling, SOB or lightheadedness with hypotension: Yes Has patient had a PCN reaction causing severe rash involving mucus membranes or skin necrosis: No Has patient had a PCN reaction that required  hospitalization No Has patient had a PCN reaction occurring within the last 10 years: No If all of the above answers are "NO", then may proceed with Cephalosporin use.     Current Outpatient Medications on File Prior to Visit  Medication Sig Dispense Refill   acetaminophen (TYLENOL) 500 MG tablet Take 1,000 mg by mouth every 6 (six) hours as needed for moderate pain or fever.     azithromycin (ZITHROMAX) 250 MG tablet Take 1 tablet (250 mg total) by mouth daily. Take 250mg  once daily for the next 4 days starting on 05/04/21. 4 tablet 0   fluconazole (DIFLUCAN) 150 MG tablet 1 tab po q day 1 tablet 0   fluticasone (FLONASE) 50 MCG/ACT nasal spray Place 2 sprays into both nostrils daily. 16 g 4   Prenatal Vit-Fe Fumarate-FA (PRENATAL MULTIVITAMIN) TABS tablet Take 1 tablet by mouth daily at 12 noon.     sertraline (ZOLOFT) 50 MG tablet Take 50 mg by mouth daily.     No current facility-administered medications on file prior to visit.    There were no vitals taken for this visit.      Objective:   Physical Exam   General Mental Status- Alert. General Appearance- Not in acute distress.   Skin General: Color- Normal Color. Moisture- Normal Moisture.  Neck Carotid Arteries- Normal color. Moisture- Normal Moisture. No carotid bruits. No JVD.  Chest and Lung Exam Auscultation: Breath Sounds:-Normal.  Cardiovascular Auscultation:Rythm- Regular. Murmurs & Other Heart Sounds:Auscultation of the heart reveals- No Murmurs.  Abdomen Inspection:-Inspeection Normal. Palpation/Percussion:Note:No mass. Palpation and Percussion of the abdomen reveal- Non Tender, Non Distended + BS, no rebound or guarding.   Neurologic Cranial Nerve exam:- CN III-XII intact(No nystagmus), symmetric smile. Strength:- 5/5 equal and symmetric strength both upper and lower extremities.   Hands- no obvious abnormality. No pain on palpation. Feet- overall both feet normal.  No pain on palpation.(Except known  5th metatarsal mid region bone protuberance evaluated by podiatrist)  Back- faint mid lumbar and si tenderness.    Assessment & Plan:   Patient Instructions  For severe fatigue placed order for cmp, cbc. B12, b1, tsh, t4 and iron level.  Anemia- follow cbc and iron level.  Vit D deficiency- vit D level today.  For arthralgias- get inflammatory lab panel.   For back pain get lumbar spine will get xray.  For migraine ha increased recently rx imitrex. Rx advisement.  For join pains advise continue ibuprofen and combine with tylenol. May give rx med after lab and imaging.  Follow up 3 weeks or sooner if needed.   05/06/21, PA-C

## 2021-07-04 NOTE — Patient Instructions (Addendum)
For severe fatigue placed order for cmp, cbc. B12, b1, tsh, t4 and iron level.  Anemia- follow cbc and iron level.  Vit D deficiency- vit D level today.  For arthralgias- get inflammatory lab panel.   For back pain get lumbar spine will get xray.  For migraine ha increased recently rx imitrex. Rx advisement.  For join pains advise continue ibuprofen and combine with tylenol. May give rx med after lab and imaging.  Follow up 3 weeks or sooner if needed.

## 2021-07-04 NOTE — Addendum Note (Signed)
Addended by: Rosita Kea on: 07/04/2021 09:07 AM   Modules accepted: Orders

## 2021-07-05 ENCOUNTER — Encounter: Payer: Self-pay | Admitting: Medical

## 2021-07-05 MED ORDER — VITAMIN D (ERGOCALCIFEROL) 1.25 MG (50000 UNIT) PO CAPS: 50000.0000 [IU] | ORAL_CAPSULE | ORAL | 0 refills | Status: DC

## 2021-07-05 NOTE — Addendum Note (Signed)
Addended by: Gwenevere Abbot on: 07/05/2021 08:17 AM   Modules accepted: Orders

## 2021-07-06 LAB — CBC WITH DIFFERENTIAL/PLATELET
Basophils Absolute: 0 10*3/uL (ref 0.0–0.2)
Basos: 1 %
EOS (ABSOLUTE): 0 10*3/uL (ref 0.0–0.4)
Eos: 1 %
Hematocrit: 34.5 % (ref 34.0–46.6)
Hemoglobin: 10.5 g/dL — ABNORMAL LOW (ref 11.1–15.9)
Immature Grans (Abs): 0 10*3/uL (ref 0.0–0.1)
Immature Granulocytes: 0 %
Lymphocytes Absolute: 2.4 10*3/uL (ref 0.7–3.1)
Lymphs: 59 %
MCH: 24.1 pg — ABNORMAL LOW (ref 26.6–33.0)
MCHC: 30.4 g/dL — ABNORMAL LOW (ref 31.5–35.7)
MCV: 79 fL (ref 79–97)
Monocytes Absolute: 0.4 10*3/uL (ref 0.1–0.9)
Monocytes: 11 %
Neutrophils Absolute: 1.1 10*3/uL — ABNORMAL LOW (ref 1.4–7.0)
Neutrophils: 28 %
Platelets: 316 10*3/uL (ref 150–450)
RBC: 4.35 x10E6/uL (ref 3.77–5.28)
RDW: 18.5 % — ABNORMAL HIGH (ref 11.7–15.4)
WBC: 4 10*3/uL (ref 3.4–10.8)

## 2021-07-06 LAB — COMPREHENSIVE METABOLIC PANEL
ALT: 11 IU/L (ref 0–32)
AST: 16 IU/L (ref 0–40)
Albumin/Globulin Ratio: 1.6 (ref 1.2–2.2)
Albumin: 4.1 g/dL (ref 3.8–4.8)
Alkaline Phosphatase: 70 IU/L (ref 44–121)
BUN/Creatinine Ratio: 5 — ABNORMAL LOW (ref 9–23)
BUN: 4 mg/dL — ABNORMAL LOW (ref 6–20)
Bilirubin Total: 0.3 mg/dL (ref 0.0–1.2)
CO2: 22 mmol/L (ref 20–29)
Calcium: 9.1 mg/dL (ref 8.7–10.2)
Chloride: 103 mmol/L (ref 96–106)
Creatinine, Ser: 0.79 mg/dL (ref 0.57–1.00)
Globulin, Total: 2.6 g/dL (ref 1.5–4.5)
Glucose: 92 mg/dL (ref 70–99)
Potassium: 4 mmol/L (ref 3.5–5.2)
Sodium: 137 mmol/L (ref 134–144)
Total Protein: 6.7 g/dL (ref 6.0–8.5)
eGFR: 101 mL/min/{1.73_m2} (ref >59)

## 2021-07-06 LAB — T4, FREE: Free T4: 1 ng/dL (ref 0.82–1.77)

## 2021-07-06 LAB — IRON: Iron: 48 ug/dL (ref 27–159)

## 2021-07-06 LAB — SEDIMENTATION RATE: Sed Rate: 20 mm/hr (ref 0–32)

## 2021-07-06 LAB — ANA: Anti Nuclear Antibody (ANA): NEGATIVE

## 2021-07-06 LAB — C-REACTIVE PROTEIN: CRP: <1 mg/L (ref 0–10)

## 2021-07-06 LAB — RHEUMATOID FACTOR: Rheumatoid fact SerPl-aCnc: 13.6 IU/mL (ref <14.0)

## 2021-07-06 LAB — VITAMIN B12: Vitamin B-12: 823 pg/mL (ref 232–1245)

## 2021-07-06 LAB — TSH: TSH: 2.44 u[IU]/mL (ref 0.450–4.500)

## 2021-07-06 LAB — URIC ACID: Uric Acid: 3.8 mg/dL (ref 2.6–6.2)

## 2021-07-06 LAB — VITAMIN D 25 HYDROXY (VIT D DEFICIENCY, FRACTURES): Vit D, 25-Hydroxy: 14.1 ng/mL — ABNORMAL LOW (ref 30.0–100.0)

## 2021-07-07 LAB — VITAMIN B1: Thiamine: 71.3 nmol/L (ref 66.5–200.0)

## 2021-07-28 ENCOUNTER — Other Ambulatory Visit: Payer: Self-pay | Admitting: Medical

## 2021-08-01 ENCOUNTER — Ambulatory Visit: Payer: Managed Care, Other (non HMO) | Admitting: Medical

## 2021-08-03 ENCOUNTER — Ambulatory Visit: Payer: Managed Care, Other (non HMO) | Admitting: Medical

## 2021-08-03 VITALS — BP 112/70 | HR 73 | Resp 18 | Ht 69.0 in | Wt 160.0 lb

## 2021-08-03 DIAGNOSIS — H60392 Other infective otitis externa, left ear: Secondary | ICD-10-CM | POA: Diagnosis not present

## 2021-08-03 DIAGNOSIS — D649 Anemia, unspecified: Secondary | ICD-10-CM | POA: Diagnosis not present

## 2021-08-03 DIAGNOSIS — J40 Bronchitis, not specified as acute or chronic: Secondary | ICD-10-CM | POA: Diagnosis not present

## 2021-08-03 DIAGNOSIS — M2550 Pain in unspecified joint: Secondary | ICD-10-CM

## 2021-08-03 DIAGNOSIS — J3089 Other allergic rhinitis: Secondary | ICD-10-CM

## 2021-08-03 MED ORDER — FLUCONAZOLE 150 MG PO TABS: 150.0000 mg | ORAL_TABLET | Freq: Every day | ORAL | 0 refills | Status: DC

## 2021-08-03 MED ORDER — LEVOCETIRIZINE DIHYDROCHLORIDE 5 MG PO TABS: 5.0000 mg | ORAL_TABLET | Freq: Every evening | ORAL | 3 refills | Status: DC

## 2021-08-03 MED ORDER — AZITHROMYCIN 250 MG PO TABS: ORAL_TABLET | ORAL | 0 refills | Status: AC

## 2021-08-03 MED ORDER — NEOMYCIN-POLYMYXIN-HC 3.5-10000-1 OT SOLN: 3.0000 [drp] | Freq: Three times a day (TID) | OTIC | 0 refills | Status: DC

## 2021-08-03 MED ORDER — OLOPATADINE HCL 0.1 % OP SOLN: 1.0000 [drp] | Freq: Two times a day (BID) | OPHTHALMIC | 11 refills | Status: DC

## 2021-08-03 MED ORDER — BENZONATATE 100 MG PO CAPS
100.0000 mg | ORAL_CAPSULE | Freq: Three times a day (TID) | ORAL | 0 refills | Status: DC | PRN
Start: 2021-08-03 — End: 2021-11-08

## 2021-08-03 NOTE — Addendum Note (Signed)
Addended by: Gwenevere Abbot on: 08/03/2021 09:03 AM   Modules accepted: Orders, Level of Service

## 2021-08-03 NOTE — Addendum Note (Signed)
Addended by: Rosita Kea on: 08/03/2021 09:10 AM   Modules accepted: Orders

## 2021-08-03 NOTE — Patient Instructions (Addendum)
Allergic rhinitis symptoms for one week but allergic conjunctivitis symptoms rear round. Rx xyzal antihistamine and use your flonase. Also sent in rx patanol eye drops.  For otitis externa- rx cortisporin otic.    For bronchitis and productive cough rx azithromycin. Will send in benzonatate if cough worsens.  For skin hyperpigmentation post folliculitis.Recommend using moisturizer with vit E. Try palmers lotion. If area reoccurs as before like on my chart pictures ask you come in for office visit.  For joint pains and body aches with negative inflammatory labs will refer to rheumatologist.  For anemia do ifob study. Ask also that you see your gyn as 1st days heavy may be cause of anemia.Want gyn opinion if contraceptive can be used to control cycle bleeding.  Follow up 2 weeks or sooner if needed

## 2021-08-03 NOTE — Progress Notes (Addendum)
Subjective:    Patient ID: April Rodgers, female    DOB: [birth year], 36 y.o.   MRN: [id number]  HPI  Pt in with some bilateral ear discomfort for one week. Worse on left side. No recent nasal congestion and no swimming. Mild cough for one week. Some productive green mucus. She states can feel some pnd and has some itching.  She has tried benadryl.  Pt notes that her eyes itch year round.  Pt shows me area on upper arms that she had questions about on my cahrt message one month ago.  Area got better. But now describes spots/hyperpigmentation.  Also discussed labs done from last visit. Pt is low level anemia. Iron level normal. Denies heavy menses. First 2 days very heavy. Not on contraceptive. No black or blood stools.  Also diffuse body aches all over. Worse hands and feet. Pain more at night.   Last visit "Also reporting pain to back of hand and feet. Also some lower back pain.  Hand pain for 3 month. Feet pain for 3 months.   Back pain for one year. No injury or fall. No radicular pain. Pain in both mid SI and middle lspine."  Inflammatory labs negative.   Review of Systems  Constitutional:  Negative for chills, fatigue and fever.  HENT:  Positive for congestion and ear pain.   Respiratory:  Positive for cough. Negative for chest tightness, shortness of breath and wheezing.   Cardiovascular:  Negative for chest pain and palpitations.  Gastrointestinal:  Negative for abdominal pain.  Genitourinary:  Negative for dysuria.  Skin:  Negative for rash.  Hematological:  Negative for adenopathy.    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   History of chorioamnionitis    03-10-2015   History of fetal loss    03-10-2015  non-viable fetal demise twins at  [redacted]w[redacted]d due to chorioamnionitis infection with PPROM   Hx of varicella    Hydrosalpinx    RIGHT   Migraine    Normal exercise tolerance test results    03-22-2006   Obstruction of fallopian tube    bilateral occlusion   Scoliosis      Social History   Socioeconomic History   Marital status: Married    Spouse name: Not on file   Number of children: Not on file   Years of education: Not on file   Highest education level: Not on file  Occupational History   Not on file  Tobacco Use   Smoking status: Never   Smokeless tobacco: Never  Substance and Sexual Activity   Alcohol use: No   Drug use: No   Sexual activity: Yes  Other Topics Concern   Not on file  Social History Narrative   Not on file   Social Determinants of Health   Financial Resource Strain: Not on file  Food Insecurity: Not on file  Transportation Needs: Not on file  Physical Activity: Not on file  Stress: Not on file  Social Connections: Not on file  Intimate Partner Violence: Not on file    Past Surgical History:  Procedure Laterality Date   DILATION AND CURETTAGE OF UTERUS  2006   HYSTEROSCOPY WITH D &  C N/A 06/28/2015   Procedure: SUCTION DILATATION AND CURETTAGE /HYSTEROSCOPY, ;  Surgeon: Fermin Schwab, MD;  Location: [facility];  Service: Gynecology;  Laterality: N/A;   LAPAROSCOPY Left 06/28/2015   Procedure: LAPAROSCOPY LYSIS OF ADHESIONS, RIGHT SALPINGECTOMY WITH NOVY CATHERIZATION OF LEFT TUBE,EXCISION OF ENDOMETROSIS;  Surgeon: Fermin Schwab, MD;  Location: [facility];  Service: Gynecology;  Laterality: Left;   TONSILLECTOMY  2012   WISDOM TOOTH EXTRACTION  2016    Family History  Problem Relation Age of Onset   Anemia Mother     Allergies  Allergen Reactions   Penicillins Hives and Rash    Has patient had a PCN reaction causing immediate rash, facial/tongue/throat swelling, SOB or lightheadedness with hypotension: Yes Has patient had a PCN reaction  causing severe rash involving mucus membranes or skin necrosis: No Has patient had a PCN reaction that required hospitalization No Has patient had a PCN reaction occurring within the last 10 years: No If all of the above answers are "NO", then may proceed with Cephalosporin use.     Current Outpatient Medications on File Prior to Visit  Medication Sig Dispense Refill   acetaminophen (TYLENOL) 500 MG tablet Take 1,000 mg by mouth every 6 (six) hours as needed for moderate pain or fever.     fluconazole (DIFLUCAN) 150 MG tablet 1 tab po q day 1 tablet 0   fluticasone (FLONASE) 50 MCG/ACT nasal spray Place 2 sprays into both nostrils daily. 16 g 4   Prenatal Vit-Fe Fumarate-FA (PRENATAL MULTIVITAMIN) TABS tablet Take 1 tablet by mouth daily at 12 noon.     sertraline (ZOLOFT) 50 MG tablet Take 50 mg by mouth daily.     SUMAtriptan (IMITREX) 50 MG tablet Take 1 tablet (50 mg total) by mouth every 2 (two) hours as needed for migraine. May repeat in 2 hours if headache persists or recurs. 10 tablet 0   Vitamin D, Ergocalciferol, (DRISDOL) 1.25 MG (50000 UNIT) CAPS capsule TAKE 1 CAPSULE (50,000 UNITS TOTAL) BY MOUTH EVERY 7 (SEVEN) DAYS 4 capsule 1   No current facility-administered medications on file prior to visit.    BP 112/70   Pulse 73   Resp 18   Ht 5\' 9"  (1.753 m)   Wt 160 lb (72.6 kg)   LMP 06/13/2021   SpO2 100%   BMI 23.63 kg/m        Objective:   Physical Exam  General- No acute distress. Pleasant patient. Neck- Full range of motion, no jvd Lungs- Clear, even and unlabored. Heart- regular rate and rhythm. Neurologic- CNII- XII grossly intact.  Heent- no sinus pressure. Left ear canal small amount present. Tm normal. Mild tragus tender. Rt ear canal clear and normal tm. Allergic shiner appearance on exam boggy turbinatea  Skin- small hyperpigmented area where by my chart pictues looks like had follicullitis.     Assessment & Plan:   Patient Instructions  Allergic  rhinitis symptoms for one week but allergic conjunctivitis symptoms rear round. Rx xyzal antihistamine and use your flonase. Also sent in rx patanol eye drops.  For otitis externa- rx cortisporin otic.    For bronchitis and productive cough rx azithromycin. Will send in benzonatate if cough worsens.  For skin hyperpigmentation post folliculitis.Recommend using moisturizer with vit E. Try palmers lotion. If area reoccurs as before like on my chart pictures ask you come in for office visit.  For joint pains and body aches with negative inflammatory labs will refer to  rheumatologist.  For anemia do ifob study. Ask also that you see your gyn as 1st days heavy may be cause of anemia.Want gyn opinion if contraceptive can be used to control cycle bleeding.  Follow up 2 weeks or sooner if needed   Esperanza Richters, PA-C    Time spent with patient today was 40  minutes which consisted of chart review, discussing diagnosis, work up, treatment and documentation.

## 2021-11-08 ENCOUNTER — Ambulatory Visit: Payer: Managed Care, Other (non HMO) | Admitting: Medical

## 2021-11-08 VITALS — BP 102/70 | HR 92 | Temp 98.3°F | Resp 18 | Ht 69.0 in | Wt 154.0 lb

## 2021-11-08 DIAGNOSIS — J3089 Other allergic rhinitis: Secondary | ICD-10-CM | POA: Diagnosis not present

## 2021-11-08 DIAGNOSIS — J029 Acute pharyngitis, unspecified: Secondary | ICD-10-CM | POA: Diagnosis not present

## 2021-11-08 DIAGNOSIS — J0100 Acute maxillary sinusitis, unspecified: Secondary | ICD-10-CM

## 2021-11-08 MED ORDER — AZITHROMYCIN 250 MG PO TABS: ORAL_TABLET | ORAL | 0 refills | Status: AC

## 2021-11-08 MED ORDER — FLUCONAZOLE 150 MG PO TABS: ORAL_TABLET | ORAL | 0 refills | Status: DC

## 2021-11-08 NOTE — Progress Notes (Signed)
Subjective:    Patient ID: April Rodgers, female    DOB: Dec 09, 1985, 36 y.o.   MRN: [id number]  HPI  Pt in with sore throat.  Pt had sore throat since august.   Most 11-06-2021 went to UC recently evaluated for strep and she also had rapid strep and she had G?C study pending.   Pt states back in august she she test positive for strep. Pt has 3 children.  Pt told to use xyzal and flonase otc. Some itching eyes.   Does hurt to swallow.  Some rt side frontal and maxillary pressure.  Recurrent alllergy type symptoms over the years. She has missed work related to this in past.  Lmp- 2 weeks ago.   Review of Systems  Constitutional:  Negative for chills, fatigue and fever.  HENT:  Positive for congestion, sinus pressure, sinus pain and sore throat. Negative for drooling and ear discharge.   Eyes:  Positive for itching.  Respiratory:  Negative for cough, chest tightness, shortness of breath and wheezing.   Cardiovascular:  Negative for chest pain and palpitations.  Gastrointestinal:  Negative for abdominal pain, blood in stool and diarrhea.  Musculoskeletal:  Negative for back pain, gait problem and joint swelling.  Skin:  Negative for pallor, rash and wound.    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   History of chorioamnionitis    03-10-2015   History of fetal loss    03-10-2015  non-viable fetal demise twins at [redacted]w[redacted]d due to chorioamnionitis infection with PPROM   Hx of varicella    Hydrosalpinx    RIGHT   Migraine    Normal exercise tolerance test results    03-22-2006   Obstruction of fallopian tube    bilateral occlusion   Scoliosis      Social History   Socioeconomic History   Marital status: Married    Spouse name: Not on file   Number  of children: Not on file   Years of education: Not on file   Highest education level: Not on file  Occupational History   Not on file  Tobacco Use   Smoking status: Never   Smokeless tobacco: Never  Substance and Sexual Activity   Alcohol use: No   Drug use: No   Sexual activity: Yes  Other Topics Concern   Not on file  Social History Narrative   Not on file   Social Determinants of Health   Financial Resource Strain: Not on file  Food Insecurity: Not on file  Transportation Needs: Not on file  Physical Activity: Not on file  Stress: Not on file  Social Connections: Not on file  Intimate Partner Violence: Not on file    Past Surgical History:  Procedure Laterality Date   DILATION AND CURETTAGE OF UTERUS  2006   HYSTEROSCOPY WITH D & C N/A 06/28/2015   Procedure: SUCTION DILATATION AND CURETTAGE /HYSTEROSCOPY, ;  Surgeon: Fermin Schwab, MD;  Location: [facility];  Service: Gynecology;  Laterality: N/A;   LAPAROSCOPY Left 06/28/2015   Procedure: LAPAROSCOPY LYSIS OF ADHESIONS, RIGHT SALPINGECTOMY WITH NOVY CATHERIZATION OF LEFT TUBE,EXCISION OF ENDOMETROSIS;  Surgeon: Fermin Schwab, MD;  Location: [facility];  Service: Gynecology;  Laterality: Left;   TONSILLECTOMY  2012   WISDOM TOOTH EXTRACTION  2016    Family History  Problem Relation Age of Onset   Anemia Mother     Allergies  Allergen Reactions   Penicillins Hives and Rash    Has patient had a PCN reaction causing immediate rash, facial/tongue/throat swelling, SOB or lightheadedness with hypotension: Yes Has patient had a PCN reaction causing severe rash involving mucus membranes or skin necrosis: No Has patient had a PCN reaction that required hospitalization No Has patient had a PCN reaction occurring within the last 10 years: No If all of the above answers are "NO", then may proceed with Cephalosporin use.     Current Outpatient Medications on File Prior to Visit   Medication Sig Dispense Refill   acetaminophen (TYLENOL) 500 MG tablet Take 1,000 mg by mouth every 6 (six) hours as needed for moderate pain or fever.     fluconazole (DIFLUCAN) 150 MG tablet Take 1 tablet (150 mg total) by mouth daily. 1 tablet 0   levocetirizine (XYZAL) 5 MG tablet Take 1 tablet (5 mg total) by mouth every evening. 30 tablet 3   neomycin-polymyxin-hydrocortisone (CORTISPORIN) OTIC solution Place 3 drops into the left ear 3 (three) times daily. 10 mL 0   olopatadine (PATANOL) 0.1 % ophthalmic solution Place 1 drop into both eyes 2 (two) times daily. 5 mL 11   Prenatal Vit-Fe Fumarate-FA (PRENATAL MULTIVITAMIN) TABS tablet Take 1 tablet by mouth daily at 12 noon.     sertraline (ZOLOFT) 50 MG tablet Take 50 mg by mouth daily.     SUMAtriptan (IMITREX) 50 MG tablet Take 1 tablet (50 mg total) by mouth every 2 (two) hours as needed for migraine. May repeat in 2 hours if headache persists or recurs. 10 tablet 0   Vitamin D, Ergocalciferol, (DRISDOL) 1.25 MG (50000 UNIT) CAPS capsule TAKE 1 CAPSULE (50,000 UNITS TOTAL) BY MOUTH EVERY 7 (SEVEN) DAYS 4 capsule 1   No current facility-administered medications on file prior to visit.    BP 102/70   Pulse 92   Temp 98.3 F (36.8 C)   Resp 18   Ht 5\' 9"  (1.753 m)   Wt 154 lb (69.9 kg)   SpO2 100%   BMI 22.74 kg/m        Objective:   Physical Exam  General- No acute distress. Pleasant patient. Neck- Full range of motion, no jvd Lungs- Clear, even and unlabored. Heart- regular rate and rhythm. Neurologic- CNII- XII grossly intact.   Heent- mild red pharynx, mild tender submandibular nodes but not enlarged on palpation.        Assessment & Plan:   Patient Instructions  Recurrent st since august. Just recent was more severe. Rapid strep negative. G/C throat study pending. With sore throat and possible sinus infections decided to go ahead and rx azithromycin antibiotic. Let me know what UC study results are when  back.  I do think you have allergic rhintis component presently as well. Continue xyzal and flonase. After studies back from UC if negative and symptoms persisting then will add on low dose medrol dose pack. In addition may refer to ENT  Follow up in 7-10 days or sooner if needed.     Mackie Pai, PA-C

## 2021-11-08 NOTE — Patient Instructions (Addendum)
Recurrent st since august. Just recent was more severe. Rapid strep negative. G/C throat study pending. With sore throat and possible sinus infections decided to go ahead and rx azithromycin antibiotic. Let me know what UC study results are when back.  I do think you have allergic rhintis component presently as well. Continue xyzal and flonase. After studies back from UC if negative and symptoms persisting then will add on low dose medrol dose pack. In addition may refer to ENT  Follow up in 7-10 days or sooner if needed.

## 2021-11-11 ENCOUNTER — Encounter: Payer: Self-pay | Admitting: Medical

## 2021-11-21 ENCOUNTER — Ambulatory Visit: Payer: Managed Care, Other (non HMO) | Admitting: Medical

## 2021-11-27 ENCOUNTER — Ambulatory Visit: Payer: Managed Care, Other (non HMO) | Admitting: Medical

## 2021-11-27 VITALS — BP 114/70 | HR 82 | Temp 98.0°F | Resp 18 | Ht 69.0 in | Wt 156.2 lb

## 2021-11-27 DIAGNOSIS — J309 Allergic rhinitis, unspecified: Secondary | ICD-10-CM

## 2021-11-27 DIAGNOSIS — M2550 Pain in unspecified joint: Secondary | ICD-10-CM

## 2021-11-27 MED ORDER — OLOPATADINE HCL 0.1 % OP SOLN
1.0000 [drp] | Freq: Two times a day (BID) | OPHTHALMIC | 4 refills | Status: AC
Start: 2021-11-27 — End: ?

## 2021-11-27 MED ORDER — METHYLPREDNISOLONE 4 MG PO TABS: ORAL_TABLET | ORAL | 0 refills | Status: DC

## 2021-11-27 MED ORDER — FLUTICASONE PROPIONATE 50 MCG/ACT NA SUSP: 2.0000 | Freq: Every day | NASAL | 1 refills | Status: DC

## 2021-11-27 MED ORDER — LEVOCETIRIZINE DIHYDROCHLORIDE 5 MG PO TABS: 5.0000 mg | ORAL_TABLET | Freq: Every evening | ORAL | 3 refills | Status: DC

## 2021-11-27 NOTE — Patient Instructions (Addendum)
Allergic rhinitis signs/symptoms- some improvement about  50% with xyzal, flonase and patanol. I do think reasonable to give medrol dose pack as add on and refer to allergist at this point.  ST- pharyngitis type symptoms resolved.  Arthralgias to both hands at night with nightly back pain. Will get inflammatory labs and assess response to medrol 6 day taper dose course.  Keep appointment with rheumatologist on February 14, 2021  Follow up date to be determined after lab review.

## 2021-11-27 NOTE — Progress Notes (Signed)
Subjective:    Patient ID: April Rodgers, female    DOB: December 11, 1985, 36 y.o.   MRN: [id number]  HPI  Pt in for follow up.  "Recurrent st since august. Just recent was more severe. Rapid strep negative. G/C throat study pending. With sore throat and possible sinus infections decided to go ahead and rx azithromycin antibiotic. Let me know what UC study results are when back.   I do think you have allergic rhinitis component presently as well. Continue xyzal and Flonase. After studies back from UC if negative and symptoms persisting then will add on low dose medrol dose pack. In addition may refer to ENT"   Pt tells me that her throat pain has improve/resolved with zpack. Her g and c test came back negative.      Pt states had chronic pnd and her eyes itch for years(pt has never seen allergist). No nasal congestion today but does get nasal congested easily.  Occassionally gets skin rashes but not presently.  She gets hand pain at night.in morning notices diffuse body aches.  Also back pain at night.  Hand pain at night since March 2023.   Lmp- 11-22-2021    Review of Systems  Constitutional:  Negative for chills, fatigue and fever.  Respiratory:  Negative for cough, chest tightness, shortness of breath and wheezing.   Cardiovascular:  Negative for chest pain and palpitations.  Gastrointestinal:  Negative for abdominal pain, blood in stool and diarrhea.  Genitourinary:  Negative for decreased urine volume, dyspareunia, enuresis, flank pain, frequency, genital sores, hematuria and pelvic pain.  Musculoskeletal:  Positive for arthralgias. Negative for back pain and joint swelling.  Skin:  Negative for rash.    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   History  of chorioamnionitis    03-10-2015   History of fetal loss    03-10-2015  non-viable fetal demise twins at [redacted]w[redacted]d due to chorioamnionitis infection with PPROM   Hx of varicella    Hydrosalpinx    RIGHT   Migraine    Normal exercise tolerance test results    03-22-2006   Obstruction of fallopian tube    bilateral occlusion   Scoliosis      Social History   Socioeconomic History   Marital status: Married    Spouse name: Not on file   Number of children: Not on file   Years of education: Not on file   Highest education level: Not on file  Occupational History   Not on file  Tobacco Use   Smoking status: Never   Smokeless tobacco: Never  Substance and Sexual Activity   Alcohol use: No   Drug use: No   Sexual activity: Yes  Other Topics Concern   Not on file  Social History Narrative   Not on file   Social Determinants of Health   Financial Resource Strain: Not on file  Food Insecurity: Not on file  Transportation Needs: Not on file  Physical Activity: Not on file  Stress: Not on file  Social Connections: Not on file  Intimate Partner Violence: Not on file    Past Surgical History:  Procedure Laterality Date   DILATION AND CURETTAGE OF UTERUS  2006   HYSTEROSCOPY WITH D & C N/A 06/28/2015   Procedure: SUCTION DILATATION AND CURETTAGE /HYSTEROSCOPY, ;  Surgeon: Fermin Schwab, MD;  Location: [facility];  Service: Gynecology;  Laterality: N/A;   LAPAROSCOPY Left 06/28/2015   Procedure: LAPAROSCOPY LYSIS OF ADHESIONS, RIGHT SALPINGECTOMY WITH NOVY CATHERIZATION OF LEFT TUBE,EXCISION OF ENDOMETROSIS;  Surgeon: Fermin Schwab, MD;  Location: [facility];  Service: Gynecology;  Laterality: Left;   TONSILLECTOMY  2012   WISDOM TOOTH EXTRACTION  2016    Family History  Problem Relation Age of Onset   Anemia Mother     Allergies  Allergen Reactions   Penicillins Hives and Rash    Has patient had a PCN reaction causing immediate rash,  facial/tongue/throat swelling, SOB or lightheadedness with hypotension: Yes Has patient had a PCN reaction causing severe rash involving mucus membranes or skin necrosis: No Has patient had a PCN reaction that required hospitalization No Has patient had a PCN reaction occurring within the last 10 years: No If all of the above answers are "NO", then may proceed with Cephalosporin use.     Current Outpatient Medications on File Prior to Visit  Medication Sig Dispense Refill   acetaminophen (TYLENOL) 500 MG tablet Take 1,000 mg by mouth every 6 (six) hours as needed for moderate pain or fever.     fluconazole (DIFLUCAN) 150 MG tablet Take 1 tablet (150 mg total) by mouth daily. 1 tablet 0   fluconazole (DIFLUCAN) 150 MG tablet 1 tab po day 3 and repeat in 7 days if needed 2 tablet 0   levocetirizine (XYZAL) 5 MG tablet Take 1 tablet (5 mg total) by mouth every evening. 30 tablet 3   neomycin-polymyxin-hydrocortisone (CORTISPORIN) OTIC solution Place 3 drops into the left ear 3 (three) times daily. 10 mL 0   olopatadine (PATANOL) 0.1 % ophthalmic solution Place 1 drop into both eyes 2 (two) times daily. 5 mL 11   Prenatal Vit-Fe Fumarate-FA (PRENATAL MULTIVITAMIN) TABS tablet Take 1 tablet by mouth daily at 12 noon.     sertraline (ZOLOFT) 50 MG tablet Take 50 mg by mouth daily.     SUMAtriptan (IMITREX) 50 MG tablet Take 1 tablet (50 mg total) by mouth every 2 (two) hours as needed for migraine. May repeat in 2 hours if headache persists or recurs. 10 tablet 0   Vitamin D, Ergocalciferol, (DRISDOL) 1.25 MG (50000 UNIT) CAPS capsule TAKE 1 CAPSULE (50,000 UNITS TOTAL) BY MOUTH EVERY 7 (SEVEN) DAYS 4 capsule 1   No current facility-administered medications on file prior to visit.    BP 114/70   Pulse 82   Temp 98 F (36.7 C)   Resp 18   Ht 5\' 9"  (1.753 m)   Wt 156 lb 3.2 oz (70.9 kg)   SpO2 100%   BMI 23.07 kg/m        Objective:   Physical Exam  General Mental Status- Alert.  General Appearance- Not in acute distress.   Skin General: Color- Normal Color. Moisture- Normal Moisture.  Neck Carotid Arteries- Normal color. Moisture- Normal Moisture. No carotid bruits. No JVD.  Chest and Lung Exam Auscultation: Breath Sounds:-Normal.  Cardiovascular Auscultation:Rythm- Regular. Murmurs & Other Heart Sounds:Auscultation of the heart reveals- No Murmurs.   Neurologic Cranial Nerve exam:- CN III-XII intact(No nystagmus), symmetric smile. Strength:- 5/5 equal and symmetric strength both upper and lower extremities.   Heent- nml  pharynx, non  tender submandibular nodes and not enlarged. Appearance of allergic shiner,  pnd on boggy turbinates.  Hands- bilaterally- no swollen but reports diffuse pain at night.     Assessment & Plan:   Patient Instructions  Allergic rhinitis signs/symptoms- some improvement about  50% with xyzal, flonase and patanol. I do think reasonable to give medrol dose pack as add on and refer to allergist at this point.  ST- pharyngitis type symptoms resolved.  Arthralgias to both hands at night with nightly back pain. Will get inflammatory labs and assess response to medrol 6 day taper dose course.  Keep appointment with rheumatologist on February 14, 2021  Follow up date to be determined after lab review.    Esperanza Richters, PA-C

## 2021-11-27 NOTE — Addendum Note (Signed)
Addended by: Anabel Halon on: 11/27/2021 10:07 AM   Modules accepted: Orders

## 2022-02-11 NOTE — Progress Notes (Unsigned)
Office Visit Note  Patient: April Rodgers             Date of Birth: December 09, 1985           MRN: [id number]             PCP: Elise Benne Referring: Elise Benne Visit Date: 02/14/2022 Occupation: @GUAROCC @  Subjective:  Pain in multiple joints and muscles  History of Present Illness: April Rodgers is a 37 y.o. female seen in consultation per request of her PCP.  According to the patient her symptoms a started about 2 years ago with pain and stiffness in her hands and feet.  She states she notices some discomfort in her hands.  She has not noticed any swelling.  She states the symptoms last for about 10 to 15 minutes and then resolved.  She also has lower back pain for the last 2 years.  She states the lower back pain is constant.  Pain is mostly localized to her back without any radiculopathy.  Has been having infrequent rashes on her extremities and her face.  She states they are like hives with itch and last for about 2 to 3 days.  She usually takes Benadryl and uses topical Benadryl and hydrocortisone cream.  She had an episode on her lips in March 2023 which lasted for few weeks.  She has an appointment with the allergist on March 09, 2022.  She also gives history of increased fatigue.  She states her fatigue could be severe enough that she had to miss work in the past.  She gives history of frequent infections.  There is no family history of autoimmune disease.  She is gravida 5, para 3, miscarriage 1, abortion 1.  She is currently not using any contraception.    Activities of Daily Living:  Patient reports morning stiffness for 10-15 minutes.   Patient Reports nocturnal pain.  Difficulty dressing/grooming: Denies Difficulty climbing stairs: Denies Difficulty getting out of chair: Denies Difficulty using hands for taps, buttons, cutlery, and/or writing: Denies  Review of Systems  Constitutional:  Positive for fatigue.  HENT:  Positive for mouth dryness.  Negative for mouth sores.   Eyes:  Negative for dryness.  Respiratory:  Negative for difficulty breathing.   Cardiovascular:  Negative for chest pain and palpitations.  Gastrointestinal:  Negative for blood in stool, constipation and diarrhea.  Endocrine: Positive for increased urination.  Genitourinary:  Negative for involuntary urination.  Musculoskeletal:  Positive for joint pain, joint pain, myalgias, morning stiffness, muscle tenderness and myalgias. Negative for gait problem, joint swelling and muscle weakness.  Skin:  Positive for rash. Negative for color change, hair loss and sensitivity to sunlight.  Allergic/Immunologic: Positive for susceptible to infections.  Neurological:  Positive for dizziness. Negative for headaches.  Hematological:  Negative for swollen glands.  Psychiatric/Behavioral:  Negative for depressed mood and sleep disturbance. The patient is nervous/anxious.     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   History of chorioamnionitis    03-10-2015   History of fetal loss    03-10-2015  non-viable fetal demise twins at [redacted]w[redacted]d due to chorioamnionitis infection with PPROM   Hx of varicella    Hydrosalpinx    RIGHT   Migraine    Normal exercise tolerance test results    03-22-2006   Obstruction of fallopian tube    bilateral occlusion   Scoliosis     Family History  Problem Relation Age of Onset   Anemia Mother    Allergies Son    Allergies Son     Pneumonia Son    Allergies Daughter    Past Surgical History:  Procedure Laterality Date   DILATION AND CURETTAGE OF UTERUS  2006   HYSTEROSCOPY WITH D & C N/A 06/28/2015   Procedure: SUCTION DILATATION AND CURETTAGE /HYSTEROSCOPY, ;  Surgeon: Governor Specking, MD;  Location: [facility];  Service: Gynecology;  Laterality: N/A;   LAPAROSCOPY Left 06/28/2015   Procedure: LAPAROSCOPY LYSIS OF ADHESIONS, RIGHT SALPINGECTOMY WITH NOVY CATHERIZATION OF LEFT TUBE,EXCISION OF ENDOMETROSIS;  Surgeon: Governor Specking, MD;  Location: [facility];  Service: Gynecology;  Laterality: Left;   TONSILLECTOMY  2012   WISDOM TOOTH EXTRACTION  2016   Social History   Social History Narrative   Not on file   There is no immunization history for the selected administration types on file for this patient.   Objective: Vital Signs: BP 112/75 (BP Location: Right Arm, Patient Position: Sitting, Cuff Size: Normal)   Pulse 73   Resp 14   Ht 5' 9.5" (1.765 m)   Wt 157 lb 9.6 oz (71.5 kg)   BMI 22.94 kg/m    Physical Exam Vitals and nursing note reviewed.  Constitutional:      Appearance: She is well-developed.  HENT:     Head: Normocephalic and atraumatic.  Eyes:     Conjunctiva/sclera: Conjunctivae normal.  Cardiovascular:     Rate and Rhythm: Normal rate and regular rhythm.     Heart sounds: Normal heart sounds.  Pulmonary:     Effort: Pulmonary effort is normal.     Breath sounds: Normal breath sounds.  Abdominal:     General: Bowel sounds are normal.     Palpations: Abdomen is soft.  Musculoskeletal:     Cervical back: Normal range of motion.  Lymphadenopathy:     Cervical: No cervical adenopathy.  Skin:    General: Skin is warm and dry.     Capillary Refill: Capillary refill takes less than 2 seconds.  Neurological:     Mental Status: She is alert and oriented to person, place, and time.  Psychiatric:        Behavior: Behavior normal.       Musculoskeletal Exam: Cervical spine was in good range of motion.  She had dextroscoliosis of the thoracic spine.  She had tenderness over thoracic and lumbar region.  There was no tenderness over SI joints.  Shoulder joints, elbow joints, wrist joints, MCPs PIPs and DIPs been good range of motion with no synovitis.  Hip joints and knee joints were in good range of motion.  There was no tenderness over ankles or MTPs.  No synovitis was noted.  She had positive tender points.  CDAI Exam: CDAI Score: -- Patient Global: --; Provider Global: -- Swollen: --; Tender: -- Joint Exam 02/14/2022   No joint exam has  been documented for this visit   There is currently no information documented on the homunculus. Go to the Rheumatology activity and complete the homunculus joint exam.  Investigation: No additional findings.  Imaging: No results found.  Recent Labs: Lab Results  Component Value Date   WBC 4.0 07/04/2021   HGB 10.5 (L) 07/04/2021   PLT 316 07/04/2021   NA 137 07/04/2021   K 4.0 07/04/2021   CL 103 07/04/2021   CO2 22 07/04/2021   GLUCOSE 92 07/04/2021   BUN 4 (L) 07/04/2021   CREATININE 0.79 07/04/2021   BILITOT 0.3 07/04/2021   ALKPHOS 70 07/04/2021   AST 16 07/04/2021   ALT 11 07/04/2021   PROT 6.7 07/04/2021   ALBUMIN 4.1 07/04/2021   CALCIUM 9.1 07/04/2021   GFRAA >60 03/09/2015    Speciality Comments: No specialty comments available.  Procedures:  No procedures performed Allergies: Penicillins   Assessment / Plan:     Visit Diagnoses: Polyarthralgia -patient complains of pain in multiple joints for the last 2 to 2 years.  She states she has significant stiffness in her hands and her feet.  She has never noticed any joint swelling.  She also gives history of fatigue and intermittent hives.  Labs obtained in May 2023 were reviewed.  ANA was negative.  07/04/21: ANA negative, CRP<1, Iron 48, RF 13.6, ESR 20, T4 1.00, TSH 2.440, uric acid 3.8  Pain in both hands  -she complains of stiffness and discomfort in her bilateral hands.  She works as a Artist at [employer].  She uses her hands and types all day.  No synovitis was noted on the examination today.  Plan: XR Hand 2 View Right, XR Hand 2 View Left.  X-rays of bilateral hands were unremarkable except for mild narrowing of the left CMC joint.  Pain in both feet -she complains of discomfort in her feet.  She also has some stiffness.  No synovitis was noted.  Plan: XR Foot 2 Views Right, XR Foot 2 Views Left.  X-rays of bilateral feet were unremarkable.  Other idiopathic scoliosis, thoracolumbar region-she had thoracolumbar scoliosis on the examination.  Patient states she has had a scoliosis for several years.  Patient declined referral to spine and scoliosis center.  I offered referral to physical therapy.  Patient was in agreement.  I gave her a handout on exercises and I will also refer her for physical therapy.  Pain in thoracic spine-she has chronic discomfort in her thoracic and lumbar spine.  I will refer her to physical therapy.  Chronic midline low back pain without sciatica-will refer her to physical therapy.  Myalgia-patient complains of generalized pain and discomfort.  She had a few tender points.  She also gives history of fatigue.  I discussed possibility of myofascial pain syndrome.  Benefits of water aerobics and sitting were discussed.  She would also benefit from staying on Zoloft.  Other fatigue-she gives history of extreme fatigue.  She states there are times when she has to miss work because of fatigue.  Primary insomnia-she gives history of chronic insomnia.  She states she is unable to sleep more than 5 to 6 hours at night.  Rash-patient gives history of intermittent hives.  She showed me several pictures on her iPhone.  She has an appointment coming up with the allergist.  Vitamin D deficiency-she has history of vitamin D deficiency.  Vitamin D was 14.1 on  Jul 04, 2021.   Last vitamin D prescription was  on July 28, 2020.  I will check vitamin D level today.  I will also check and a TTG and antigliadin antibodies.  History of iron deficiency anemia-patient does have history of heavy menstrual cycles.  She also has iron deficiency anemia for several years.  Patient states she has had iron infusions in the past.  She is currently not taking iron supplement.  This could be contributing to her fatigue.  History of migraine-she had intermittent migraine.  Anxiety-she has history of Lorenz Coaster but currently not taking Zoloft.  Orders: Orders Placed This Encounter  Procedures   XR Hand 2 View Right   XR Hand 2 View Left   XR Foot 2 Views Right   XR Foot 2 Views Left   CBC with Differential/Platelet   COMPLETE METABOLIC PANEL WITH GFR   Sedimentation rate   CK   TSH   VITAMIN D 25 Hydroxy (Vit-D Deficiency, Fractures)   Rheumatoid factor   Cyclic citrul peptide antibody, IgG   ANA   Pan-ANCA   Serum protein electrophoresis with reflex   IgG, IgA, IgM   Tissue transglutaminase, IgA   Gliadin antibodies, serum   Ambulatory referral to Physical Therapy   No orders of the defined types were placed in this encounter.    Follow-Up Instructions: Return for Polyarthralgia, myalgia.   Bo Merino, MD  Note - This record has been created using Editor, commissioning.  Chart creation errors have been sought, but may not always  have been located. Such creation errors do not reflect on  the standard of medical care.,

## 2022-02-13 ENCOUNTER — Ambulatory Visit: Payer: Self-pay | Admitting: Allergy & Immunology

## 2022-02-14 ENCOUNTER — Ambulatory Visit: Payer: BC Managed Care – PPO | Attending: Rheumatology | Admitting: Rheumatology

## 2022-02-14 ENCOUNTER — Ambulatory Visit: Payer: BC Managed Care – PPO

## 2022-02-14 ENCOUNTER — Ambulatory Visit (INDEPENDENT_AMBULATORY_CARE_PROVIDER_SITE_OTHER): Payer: BC Managed Care – PPO

## 2022-02-14 ENCOUNTER — Encounter: Payer: Self-pay | Admitting: Rheumatology

## 2022-02-14 VITALS — BP 112/75 | HR 73 | Resp 14 | Ht 69.5 in | Wt 157.6 lb

## 2022-02-14 DIAGNOSIS — Z862 Personal history of diseases of the blood and blood-forming organs and certain disorders involving the immune mechanism: Secondary | ICD-10-CM

## 2022-02-14 DIAGNOSIS — M2550 Pain in unspecified joint: Secondary | ICD-10-CM | POA: Diagnosis not present

## 2022-02-14 DIAGNOSIS — E559 Vitamin D deficiency, unspecified: Secondary | ICD-10-CM | POA: Diagnosis not present

## 2022-02-14 DIAGNOSIS — M79642 Pain in left hand: Secondary | ICD-10-CM

## 2022-02-14 DIAGNOSIS — R21 Rash and other nonspecific skin eruption: Secondary | ICD-10-CM

## 2022-02-14 DIAGNOSIS — F5101 Primary insomnia: Secondary | ICD-10-CM

## 2022-02-14 DIAGNOSIS — F419 Anxiety disorder, unspecified: Secondary | ICD-10-CM

## 2022-02-14 DIAGNOSIS — M79671 Pain in right foot: Secondary | ICD-10-CM

## 2022-02-14 DIAGNOSIS — R52 Pain, unspecified: Secondary | ICD-10-CM

## 2022-02-14 DIAGNOSIS — M791 Myalgia, unspecified site: Secondary | ICD-10-CM | POA: Diagnosis not present

## 2022-02-14 DIAGNOSIS — M79672 Pain in left foot: Secondary | ICD-10-CM

## 2022-02-14 DIAGNOSIS — M546 Pain in thoracic spine: Secondary | ICD-10-CM

## 2022-02-14 DIAGNOSIS — R5383 Other fatigue: Secondary | ICD-10-CM

## 2022-02-14 DIAGNOSIS — M545 Low back pain, unspecified: Secondary | ICD-10-CM

## 2022-02-14 DIAGNOSIS — M4125 Other idiopathic scoliosis, thoracolumbar region: Secondary | ICD-10-CM | POA: Diagnosis not present

## 2022-02-14 DIAGNOSIS — G8929 Other chronic pain: Secondary | ICD-10-CM

## 2022-02-14 DIAGNOSIS — M79641 Pain in right hand: Secondary | ICD-10-CM

## 2022-02-14 DIAGNOSIS — Z8669 Personal history of other diseases of the nervous system and sense organs: Secondary | ICD-10-CM

## 2022-02-14 NOTE — Patient Instructions (Signed)
Back Exercises The following exercises strengthen the muscles that help to support the trunk (torso) and back. They also help to keep the lower back flexible. Doing these exercises can help to prevent or lessen existing low back pain. If you have back pain or discomfort, try doing these exercises 2-3 times each day or as told by your health care provider. As your pain improves, do them once each day, but increase the number of times that you repeat the steps for each exercise (do more repetitions). To prevent the recurrence of back pain, continue to do these exercises once each day or as told by your health care provider. Do exercises exactly as told by your health care provider and adjust them as directed. It is normal to feel mild stretching, pulling, tightness, or discomfort as you do these exercises, but you should stop right away if you feel sudden pain or your pain gets worse. Exercises Single knee to chest Repeat these steps 3-5 times for each leg: Lie on your back on a firm bed or the floor with your legs extended. Bring one knee to your chest. Your other leg should stay extended and in contact with the floor. Hold your knee in place by grabbing your knee or thigh with both hands and hold. Pull on your knee until you feel a gentle stretch in your lower back or buttocks. Hold the stretch for 10-30 seconds. Slowly release and straighten your leg.  Pelvic tilt Repeat these steps 5-10 times: Lie on your back on a firm bed or the floor with your legs extended. Bend your knees so they are pointing toward the ceiling and your feet are flat on the floor. Tighten your lower abdominal muscles to press your lower back against the floor. This motion will tilt your pelvis so your tailbone points up toward the ceiling instead of pointing to your feet or the floor. With gentle tension and even breathing, hold this position for 5-10 seconds.  Cat-cow Repeat these steps until your lower back becomes  more flexible: Get into a hands-and-knees position on a firm bed or the floor. Keep your hands under your shoulders, and keep your knees under your hips. You may place padding under your knees for comfort. Let your head hang down toward your chest. Contract your abdominal muscles and point your tailbone toward the floor so your lower back becomes rounded like the back of a cat. Hold this position for 5 seconds. Slowly lift your head, let your abdominal muscles relax, and point your tailbone up toward the ceiling so your back forms a sagging arch like the back of a cow. Hold this position for 5 seconds.  Press-ups Repeat these steps 5-10 times: Lie on your abdomen (face-down) on a firm bed or the floor. Place your palms near your head, about shoulder-width apart. Keeping your back as relaxed as possible and keeping your hips on the floor, slowly straighten your arms to raise the top half of your body and lift your shoulders. Do not use your back muscles to raise your upper torso. You may adjust the placement of your hands to make yourself more comfortable. Hold this position for 5 seconds while you keep your back relaxed. Slowly return to lying flat on the floor.  Bridges Repeat these steps 10 times: Lie on your back on a firm bed or the floor. Bend your knees so they are pointing toward the ceiling and your feet are flat on the floor. Your arms should be flat   at your sides, next to your body. Tighten your buttocks muscles and lift your buttocks off the floor until your waist is at almost the same height as your knees. You should feel the muscles working in your buttocks and the back of your thighs. If you do not feel these muscles, slide your feet 1-2 inches (2.5-5 cm) farther away from your buttocks. Hold this position for 3-5 seconds. Slowly lower your hips to the starting position, and allow your buttocks muscles to relax completely. If this exercise is too easy, try doing it with your arms  crossed over your chest. Abdominal crunches Repeat these steps 5-10 times: Lie on your back on a firm bed or the floor with your legs extended. Bend your knees so they are pointing toward the ceiling and your feet are flat on the floor. Cross your arms over your chest. Tip your chin slightly toward your chest without bending your neck. Tighten your abdominal muscles and slowly raise your torso high enough to lift your shoulder blades a tiny bit off the floor. Avoid raising your torso higher than that because it can put too much stress on your lower back and does not help to strengthen your abdominal muscles. Slowly return to your starting position.  Back lifts Repeat these steps 5-10 times: Lie on your abdomen (face-down) with your arms at your sides, and rest your forehead on the floor. Tighten the muscles in your legs and your buttocks. Slowly lift your chest off the floor while you keep your hips pressed to the floor. Keep the back of your head in line with the curve in your back. Your eyes should be looking at the floor. Hold this position for 3-5 seconds. Slowly return to your starting position.  Contact a health care provider if: Your back pain or discomfort gets much worse when you do an exercise. Your worsening back pain or discomfort does not lessen within 2 hours after you exercise. If you have any of these problems, stop doing these exercises right away. Do not do them again unless your health care provider says that you can. Get help right away if: You develop sudden, severe back pain. If this happens, stop doing the exercises right away. Do not do them again unless your health care provider says that you can. This information is not intended to replace advice given to you by your health care provider. Make sure you discuss any questions you have with your health care provider. Document Revised: 07/19/2020 Document Reviewed: 04/06/2020 Elsevier Patient Education  2023 [employer]  Hand Exercises Hand exercises can be helpful for almost anyone. These exercises can strengthen the hands, improve flexibility and movement, and increase blood flow to the hands. These results can make work and daily tasks easier. Hand exercises can be especially helpful for people who have joint pain from arthritis or have nerve damage from overuse (carpal tunnel syndrome). These exercises can also help people who have injured a hand. Exercises Most of these hand exercises are gentle stretching and motion exercises. It is usually safe to do them often throughout the day. Warming up your hands before exercise may help to reduce stiffness. You can do this with gentle massage or by placing your hands in warm water for 10-15 minutes. It is normal to feel some stretching, pulling, tightness, or mild discomfort as you begin new exercises. This will gradually improve. Stop an exercise right away if you feel sudden, severe pain or your pain gets worse. Ask   your health care provider which exercises are best for you. Knuckle bend or "claw" fist  Stand or sit with your arm, hand, and all five fingers pointed straight up. Make sure to keep your wrist straight during the exercise. Gently bend your fingers down toward your palm until the tips of your fingers are touching the top of your palm. Keep your big knuckle straight and just bend the small knuckles in your fingers. Hold this position for __________ seconds. Straighten (extend) your fingers back to the starting position. Repeat this exercise 5-10 times with each hand. Full finger fist  Stand or sit with your arm, hand, and all five fingers pointed straight up. Make sure to keep your wrist straight during the exercise. Gently bend your fingers into your palm until the tips of your fingers are touching the middle of your palm. Hold this position for __________ seconds. Extend your fingers back to the starting position, stretching every joint  fully. Repeat this exercise 5-10 times with each hand. Straight fist Stand or sit with your arm, hand, and all five fingers pointed straight up. Make sure to keep your wrist straight during the exercise. Gently bend your fingers at the big knuckle, where your fingers meet your hand, and the middle knuckle. Keep the knuckle at the tips of your fingers straight and try to touch the bottom of your palm. Hold this position for __________ seconds. Extend your fingers back to the starting position, stretching every joint fully. Repeat this exercise 5-10 times with each hand. Tabletop  Stand or sit with your arm, hand, and all five fingers pointed straight up. Make sure to keep your wrist straight during the exercise. Gently bend your fingers at the big knuckle, where your fingers meet your hand, as far down as you can while keeping the small knuckles in your fingers straight. Think of forming a tabletop with your fingers. Hold this position for __________ seconds. Extend your fingers back to the starting position, stretching every joint fully. Repeat this exercise 5-10 times with each hand. Finger spread  Place your hand flat on a table with your palm facing down. Make sure your wrist stays straight as you do this exercise. Spread your fingers and thumb apart from each other as far as you can until you feel a gentle stretch. Hold this position for __________ seconds. Bring your fingers and thumb tight together again. Hold this position for __________ seconds. Repeat this exercise 5-10 times with each hand. Making circles  Stand or sit with your arm, hand, and all five fingers pointed straight up. Make sure to keep your wrist straight during the exercise. Make a circle by touching the tip of your thumb to the tip of your index finger. Hold for __________ seconds. Then open your hand wide. Repeat this motion with your thumb and each finger on your hand. Repeat this exercise 5-10 times with each  hand. Thumb motion  Sit with your forearm resting on a table and your wrist straight. Your thumb should be facing up toward the ceiling. Keep your fingers relaxed as you move your thumb. Lift your thumb up as high as you can toward the ceiling. Hold for __________ seconds. Bend your thumb across your palm as far as you can, reaching the tip of your thumb for the small finger (pinkie) side of your palm. Hold for __________ seconds. Repeat this exercise 5-10 times with each hand. Grip strengthening  Hold a stress ball or other soft ball in the middle   of your hand. Slowly increase the pressure, squeezing the ball as much as you can without causing pain. Think of bringing the tips of your fingers into the middle of your palm. All of your finger joints should bend when doing this exercise. Hold your squeeze for __________ seconds, then relax. Repeat this exercise 5-10 times with each hand. Contact a health care provider if: Your hand pain or discomfort gets much worse when you do an exercise. Your hand pain or discomfort does not improve within 2 hours after you exercise. If you have any of these problems, stop doing these exercises right away. Do not do them again unless your health care provider says that you can. Get help right away if: You develop sudden, severe hand pain or swelling. If this happens, stop doing these exercises right away. Do not do them again unless your health care provider says that you can. This information is not intended to replace advice given to you by your health care provider. Make sure you discuss any questions you have with your health care provider. Document Revised: 05/12/2020 Document Reviewed: 05/12/2020 Elsevier Patient Education  2023 [employer]   

## 2022-02-15 NOTE — Progress Notes (Signed)
Vitamin D is low at 12.  Please call in prescription for vitamin D 50,000 units twice a week for 3 months.  She should have repeat vitamin D level in 3 months.

## 2022-02-16 ENCOUNTER — Other Ambulatory Visit: Payer: Self-pay | Admitting: *Deleted

## 2022-02-16 ENCOUNTER — Encounter: Payer: Self-pay | Admitting: *Deleted

## 2022-02-16 DIAGNOSIS — E559 Vitamin D deficiency, unspecified: Secondary | ICD-10-CM

## 2022-02-16 MED ORDER — VITAMIN D (ERGOCALCIFEROL) 1.25 MG (50000 UNIT) PO CAPS: 50000.0000 [IU] | ORAL_CAPSULE | ORAL | 0 refills | Status: DC

## 2022-02-16 NOTE — Telephone Encounter (Signed)
-----  Message from Bo Merino, MD sent at 02/15/2022  8:00 AM EST ----- Vitamin D is low at 12.  Please call in prescription for vitamin D 50,000 units twice a week for 3 months.  She should have repeat vitamin D level in 3 months.

## 2022-02-16 NOTE — Progress Notes (Signed)
I will discuss results at the follow-up visit.

## 2022-02-18 LAB — CBC WITH DIFFERENTIAL/PLATELET
Absolute Monocytes: 279 cells/uL (ref 200–950)
Basophils Absolute: 19 cells/uL (ref 0–200)
Basophils Relative: 0.6 %
Eosinophils Absolute: 31 cells/uL (ref 15–500)
Eosinophils Relative: 1 %
HCT: 32.1 % — ABNORMAL LOW (ref 35.0–45.0)
Hemoglobin: 10.3 g/dL — ABNORMAL LOW (ref 11.7–15.5)
Lymphs Abs: 1786 cells/uL (ref 850–3900)
MCH: 25.6 pg — ABNORMAL LOW (ref 27.0–33.0)
MCHC: 32.1 g/dL (ref 32.0–36.0)
MCV: 79.9 fL — ABNORMAL LOW (ref 80.0–100.0)
MPV: 9.9 fL (ref 7.5–12.5)
Monocytes Relative: 9 %
Neutro Abs: 986 cells/uL — ABNORMAL LOW (ref 1500–7800)
Neutrophils Relative %: 31.8 %
Platelets: 278 10*3/uL (ref 140–400)
RBC: 4.02 10*6/uL (ref 3.80–5.10)
RDW: 18 % — ABNORMAL HIGH (ref 11.0–15.0)
Total Lymphocyte: 57.6 %
WBC: 3.1 10*3/uL — ABNORMAL LOW (ref 3.8–10.8)

## 2022-02-18 LAB — PROTEIN ELECTROPHORESIS, SERUM, WITH REFLEX
Albumin ELP: 3.9 g/dL (ref 3.8–4.8)
Alpha 1: 0.3 g/dL (ref 0.2–0.3)
Alpha 2: 0.8 g/dL (ref 0.5–0.9)
Beta 2: 0.3 g/dL (ref 0.2–0.5)
Beta Globulin: 0.4 g/dL (ref 0.4–0.6)
Gamma Globulin: 1.2 g/dL (ref 0.8–1.7)
Total Protein: 6.9 g/dL (ref 6.1–8.1)

## 2022-02-18 LAB — SEDIMENTATION RATE: Sed Rate: 9 mm/h (ref 0–20)

## 2022-02-18 LAB — PAN-ANCA
ANCA SCREEN: NEGATIVE
Myeloperoxidase Abs: <1 AI (ref <1.0)
Serine Protease 3: <1 AI (ref <1.0)

## 2022-02-18 LAB — COMPLETE METABOLIC PANEL WITH GFR
AG Ratio: 1.5 (calc) (ref 1.0–2.5)
ALT: 7 U/L (ref 6–29)
AST: 12 U/L (ref 10–30)
Albumin: 4.3 g/dL (ref 3.6–5.1)
Alkaline phosphatase (APISO): 59 U/L (ref 31–125)
BUN: 8 mg/dL (ref 7–25)
CO2: 25 mmol/L (ref 20–32)
Calcium: 8.8 mg/dL (ref 8.6–10.2)
Chloride: 109 mmol/L (ref 98–110)
Creat: 0.72 mg/dL (ref 0.50–0.97)
Globulin: 2.8 g/dL (calc) (ref 1.9–3.7)
Glucose, Bld: 87 mg/dL (ref 65–99)
Potassium: 4.5 mmol/L (ref 3.5–5.3)
Sodium: 139 mmol/L (ref 135–146)
Total Bilirubin: 0.2 mg/dL (ref 0.2–1.2)
Total Protein: 7.1 g/dL (ref 6.1–8.1)
eGFR: 112 mL/min/{1.73_m2} (ref >=60)

## 2022-02-18 LAB — CYCLIC CITRUL PEPTIDE ANTIBODY, IGG: Cyclic Citrullin Peptide Ab: <16 UNITS

## 2022-02-18 LAB — VITAMIN D 25 HYDROXY (VIT D DEFICIENCY, FRACTURES): Vit D, 25-Hydroxy: 12 ng/mL — ABNORMAL LOW (ref 30–100)

## 2022-02-18 LAB — GLIADIN ANTIBODIES, SERUM
Gliadin IgA: <1 U/mL
Gliadin IgG: <1 U/mL

## 2022-02-18 LAB — TSH: TSH: 1.43 mIU/L

## 2022-02-18 LAB — IGG, IGA, IGM
IgG (Immunoglobin G), Serum: 1280 mg/dL (ref 600–1640)
IgM, Serum: 159 mg/dL (ref 50–300)
Immunoglobulin A: 141 mg/dL (ref 47–310)

## 2022-02-18 LAB — ANA: Anti Nuclear Antibody (ANA): NEGATIVE

## 2022-02-18 LAB — CK: Total CK: 76 U/L (ref 29–143)

## 2022-02-18 LAB — RHEUMATOID FACTOR: Rheumatoid fact SerPl-aCnc: <14 IU/mL (ref <14)

## 2022-02-18 LAB — TISSUE TRANSGLUTAMINASE, IGA: (tTG) Ab, IgA: <1 U/mL

## 2022-02-22 NOTE — Progress Notes (Signed)
Office Visit Note  Patient: April Rodgers             Date of Birth: 03-22-1985           MRN: [id number]             PCP: Marisue Brooklyn Referring: Marisue Brooklyn Visit Date: 03/07/2022 Occupation: @GUAROCC @  Subjective:  Pain in multiple joints  History of Present Illness: April Rodgers is a 37 y.o. female with history of pain in multiple joints.  She returns for follow-up visit.  She states she continues to have joint pain and muscle pain all over her body.  She is currently sick with upper respiratory tract infection.  She complains of a stiffness in her hands.  She has not noticed any joint swelling.  She has off-and-on discomfort in her feet.  She has chronic discomfort in her entire spine.  As she has not been to physical therapy yet.  She also has an appointment coming up with the allergist tomorrow.  She started taking vitamin D 50,000 units twice a week after the prescription was sent.  She is concerned about chronic anemia and would like to be seen by hematologist for evaluation.    Activities of Daily Living:  Patient reports morning stiffness for 0 minutes.   Patient Reports nocturnal pain.  Difficulty dressing/grooming: Denies Difficulty climbing stairs: Denies Difficulty getting out of chair: Reports Difficulty using hands for taps, buttons, cutlery, and/or writing: Denies  Review of Systems  Constitutional:  Positive for fatigue.  HENT:  Positive for mouth dryness. Negative for mouth sores.   Eyes:  Positive for dryness.  Respiratory:  Negative for shortness of breath.   Cardiovascular:  Negative for chest pain and palpitations.  Gastrointestinal:  Negative for blood in stool, constipation and diarrhea.  Endocrine: Negative for increased urination.  Genitourinary:  Negative for involuntary urination.  Musculoskeletal:  Positive for joint pain, joint pain, myalgias, muscle tenderness and myalgias. Negative for gait problem, joint swelling,  muscle weakness and morning stiffness.  Skin:  Negative for color change, rash, hair loss and sensitivity to sunlight.  Allergic/Immunologic: Positive for susceptible to infections.  Neurological:  Positive for dizziness. Negative for headaches.  Hematological:  Negative for swollen glands.  Psychiatric/Behavioral:  Negative for depressed mood and sleep disturbance. The patient is nervous/anxious.     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   History of chorioamnionitis    03-10-2015   History of fetal loss    03-10-2015  non-viable fetal demise twins at [redacted]w[redacted]d due to chorioamnionitis infection with PPROM   Hx of varicella    Hydrosalpinx    RIGHT   Migraine    Normal exercise tolerance test results    03-22-2006   Obstruction of fallopian tube    bilateral occlusion   Scoliosis     Family History  Problem Relation Age of Onset  Anemia Mother    Allergies Son    Allergies Son    Pneumonia Son    Allergies Daughter    Past Surgical History:  Procedure Laterality Date   DILATION AND CURETTAGE OF UTERUS  2006   HYSTEROSCOPY WITH D & C N/A 06/28/2015   Procedure: SUCTION DILATATION AND CURETTAGE /HYSTEROSCOPY, ;  Surgeon: Governor Specking, MD;  Location: [place];  Service: Gynecology;  Laterality: N/A;   LAPAROSCOPY Left 06/28/2015   Procedure: LAPAROSCOPY LYSIS OF ADHESIONS, RIGHT SALPINGECTOMY WITH NOVY CATHERIZATION OF LEFT TUBE,EXCISION OF  ENDOMETROSIS;  Surgeon: Governor Specking, MD;  Location: [place];  Service: Gynecology;  Laterality: Left;   TONSILLECTOMY  2012   WISDOM TOOTH EXTRACTION  2016   Social History   Social History Narrative   Not on file   There is no immunization history for the selected administration types on file for this patient.   Objective: Vital Signs: BP 106/72 (BP Location: Left Arm, Patient Position: Sitting, Cuff Size: Small)   Pulse 79   Resp 12   Ht 5\' 9"  (1.753 m)   Wt 160 lb 9.6 oz (72.8 kg)   BMI 23.72 kg/m    Physical Exam Vitals and nursing note reviewed.  Constitutional:      Appearance: She is well-developed.  HENT:     Head: Normocephalic and atraumatic.  Eyes:     Conjunctiva/sclera: Conjunctivae normal.  Cardiovascular:     Rate and Rhythm: Normal rate and regular rhythm.     Heart sounds: Normal heart sounds.  Pulmonary:     Effort: Pulmonary effort is normal.     Breath sounds: Normal breath sounds.  Abdominal:     General: Bowel sounds are normal.     Palpations: Abdomen is soft.  Musculoskeletal:     Cervical back: Normal range of motion.  Lymphadenopathy:     Cervical: No cervical adenopathy.  Skin:    General: Skin is warm and dry.     Capillary Refill: Capillary refill takes less than 2 seconds.  Neurological:     Mental Status: She is alert and oriented to person, place, and time.  Psychiatric:        Behavior: Behavior normal.      Musculoskeletal Exam: Cervical skin was in good range of motion.  She has scoliosis of the thoracic spine.  She had discomfort over her entire thoracic and lumbar region.  There was no SI joint tenderness.  Shoulders, elbows, wrists, MCPs PIPs and DIPs been good range of motion with no synovitis.  Hip joints and knee joints were in good range of motion.  There was no tenderness over ankles or MTPs.  She had generalized hyperalgesia and tender points.  CDAI Exam: CDAI Score: -- Patient Global: --;  Provider Global: -- Swollen: --; Tender: -- Joint Exam 03/07/2022   No joint exam has been documented for this visit   There is currently no information documented on the homunculus. Go to the Rheumatology activity and complete the homunculus joint exam.  Investigation: No additional findings.  Imaging: XR Foot 2 Views Left  Result Date: 02/14/2022 No MTP, PIP or DIP narrowing was noted.  No intertarsal, tibiotalar or subtalar joint space narrowing was noted.  No erosive changes were noted. Impression: Unremarkable x-rays of the foot.  XR Foot 2 Views Right  Result Date: 02/14/2022 No MTP, PIP or DIP narrowing was noted.  No intertarsal, tibiotalar or subtalar joint space narrowing was noted.  No  erosive changes were noted. Impression: Unremarkable x-rays of the foot.  XR Hand 2 View Left  Result Date: 02/14/2022 No CMC, MCP, PIP, DIP, intercarpal or radiocarpal joint space narrowing was noted.  No erosive changes were noted. Impression: Unremarkable x-rays of the hand.  XR Hand 2 View Right  Result Date: 02/14/2022 No MCP PIP or DIP narrowing was noted.  No intercarpal or radiocarpal joint space narrowing was noted.  Mild CMC narrowing was noted.  No erosive changes were noted. Impression: Unremarkable x-rays of the hand except for mild CMC narrowing.   Recent Labs: Lab Results  Component Value Date   WBC 3.1 (L) 02/14/2022   HGB 10.3 (L) 02/14/2022   PLT 278 02/14/2022   NA 139 02/14/2022   K 4.5 02/14/2022   CL 109 02/14/2022   CO2 25 02/14/2022   GLUCOSE 87 02/14/2022   BUN 8 02/14/2022   CREATININE 0.72 02/14/2022   BILITOT 0.2 02/14/2022   ALKPHOS 70 07/04/2021   AST 12 02/14/2022   ALT 7 02/14/2022   PROT 7.1 02/14/2022   PROT 6.9 02/14/2022   ALBUMIN 4.1 07/04/2021   CALCIUM 8.8 02/14/2022   GFRAA >60 03/09/2015     07/04/21: ANA negative, CRP<1, Iron 48, RF 13.6, ESR 20, T4 1.00, TSH 2.440, uric acid 3.8   Speciality Comments: No specialty comments  available.  Procedures:  No procedures performed Allergies: Penicillins   Assessment / Plan:     Visit Diagnoses: Polyarthralgia - History of pain in multiple joints for the last 2 years.  No synovitis was noted.  All autoimmune workup negative.February 14, 2022 SPEP normal, immunoglobulins normal CK 76, TSH normal, ESR 9, ANA negative, RF negative, anti-CCP negative, ANCA negative, MPO negative, serum proteinase 3 negative antigliadin negative, anti-tTG negative, vitamin D12.  Labs obtained at the last visit were reviewed at length.  Arthritis of carpometacarpal (CMC) joint of left thumb - Mild left CMC joint narrowing was noted on the x-rays.  Hand muscle strengthening exercises were discussed.  Joint protection muscle strengthening was discussed.  Pain in both hands - No synovitis was noted.  X-rays were unremarkable for the left [facility] joint narrowing.  X-ray findings were reviewed with the patient.  Pain in both feet - No synovitis was noted.  X-rays were unremarkable.  X-ray findings were reviewed with the patient.  Other idiopathic scoliosis, thoracolumbar region - Patient declined referral to a spinal scoliosis center.  She was referred to physical therapy.  Patient has not scheduled an appointment with the physical therapy yet.  Pain in thoracic spine - Referral to physical therapy be was placed at the last visit.  Patient will schedule an appointment.  Chronic midline low back pain without sciatica -she continues to have lower back pain.  Core strengthening exercises were emphasized.  Patient was referred to physical therapy.  Patient plans to schedule an appointment.  Myofascial pain - History of generalized pain, myalgias.  She also has fatigue.  Water aerobics, swimming and stretching were advised.  She may benefit from staying on Zoloft.  Other fatigue - History of extreme fatigue.  Primary insomnia - History of chronic insomnia.  Rash and other nonspecific skin eruption -  Intermittent hives.  Patient had an appointment with the allergist.  Chronic anemia-she has history of chronic anemia.  Patient states she used to have heavy menstrual cycle in the past but now she does not have heavy menstrual cycle.  She would like to be evaluated by hematologist.  I  will place the referral.  Vitamin D deficiency - Vitamin D was 12.  Prescription for vitamin D 50,000 units twice a week for 3 months was sent on 02/19/22.  Patient has been taking vitamin D.  She was advised to get repeat labs in 3 months.  She was also advised to follow-up closely with her PCP for the treatment of vitamin D deficiency.  Anti-tTG and antigliadin antibodies were negative.  History of iron deficiency anemia-patient was referred to hematology per her request.  Anxiety  History of migraine  Orders: Orders Placed This Encounter  Procedures   Ambulatory referral to Hematology / Oncology   No orders of the defined types were placed in this encounter.    Follow-Up Instructions: Return if symptoms worsen or fail to improve, for Osteoarthritis.   Bo Merino, MD  Note - This record has been created using Editor, commissioning.  Chart creation errors have been sought, but may not always  have been located. Such creation errors do not reflect on  the standard of medical care.

## 2022-02-27 ENCOUNTER — Telehealth: Payer: Self-pay | Admitting: Medical

## 2022-02-27 NOTE — Telephone Encounter (Signed)
Pt was called in reference to her appt made via [place]. Pt stated that she was having issues breathing and it has gotten progressively worse over the last 72 hours. Pt was transferred to triage nurse for further eval.

## 2022-02-27 NOTE — Telephone Encounter (Signed)
Nurse Assessment Nurse: Vallery Sa, RN, Cathy Date/Time (Eastern Time): 02/27/2022 9:58:55 AM Confirm and document reason for call. If symptomatic, describe symptoms. ---Caller states she developed a sore throat and muscle aches about 3 days ago. She developed breathing difficulty with swelling in her throat about 3 days ago. No drooling or severe swallowing difficulty. No known fever. SpO2 is 99 at this time. Alert and responsive. (Possible Flu concerns due to symptoms) Does the patient have any new or worsening symptoms? ---Yes Will a triage be completed? ---Yes Related visit to physician within the last 2 weeks? ---No Does the PT have any chronic conditions? (i.e. diabetes, asthma, this includes High risk factors for pregnancy, etc.) ---Yes List chronic conditions. ---Anemia, Vit D low Is the patient pregnant or possibly pregnant? (Ask all females between the ages of 75-55) ---No Is this a behavioral health or substance abuse call? ---No Guidelines Guideline Title Affirmed Question Affirmed Notes Nurse Date/Time (Eastern Time) Sore Throat [1] Difficulty breathing AND [2] not severe Vallery Sa, RN, Cathy 02/27/2022 10:02:08 AM PLEASE NOTE: All timestamps contained within this report are represented as [place] Standard Time. CONFIDENTIALTY NOTICE: This fax transmission is intended only for the addressee. It contains information that is legally privileged, confidential or otherwise protected from use or disclosure. If you are not the intended recipient, you are strictly prohibited from reviewing, disclosing, copying using or disseminating any of this information or taking any action in reliance on or regarding this information. If you have received this fax in error, please notify us immediately by telephone so that we can arrange for its return to [place]. Phone: [phone], Toll-Free: [phone], Fax: [phone] Page: 2 of 2 Call Id: [id number] [place]. Time Eilene Ghazi Time) Disposition  Final User 02/27/2022 9:54:48 AM Send to Urgent [place], Erin 02/27/2022 10:06:04 AM Go to ED Now (or PCP triage) Yes Vallery Sa, RN, [place] Final Disposition 02/27/2022 10:06:04 AM Go to ED Now (or PCP triage) Yes Vallery Sa, RN, Rosey [place] Disagree/Comply Comply Caller Understands Yes PreDisposition Call Doctor Care Advice Given Per Guideline GO TO ED NOW (OR PCP TRIAGE): CARE ADVICE given per Sore Throat (Adult) guideline. Comments User: Berton Mount, RN Date/Time Eilene Ghazi Time): 02/27/2022 10:06:47 AM Caller states she feels like her throat is swelling shut and that she can't breath because of the swelling. She will go to the ED now. Referrals [place]

## 2022-02-27 NOTE — Telephone Encounter (Signed)
Pt called and lvm to return call to ask about other sx   Pt does have an appt tomorrow 02/28/22 at 9

## 2022-02-28 ENCOUNTER — Encounter: Payer: Self-pay | Admitting: Medical

## 2022-02-28 ENCOUNTER — Ambulatory Visit (INDEPENDENT_AMBULATORY_CARE_PROVIDER_SITE_OTHER): Payer: BC Managed Care – PPO | Admitting: Medical

## 2022-02-28 VITALS — BP 102/76 | HR 87 | Temp 98.5°F | Resp 18 | Ht 69.5 in | Wt 158.0 lb

## 2022-02-28 DIAGNOSIS — J069 Acute upper respiratory infection, unspecified: Secondary | ICD-10-CM | POA: Diagnosis not present

## 2022-02-28 DIAGNOSIS — J029 Acute pharyngitis, unspecified: Secondary | ICD-10-CM | POA: Diagnosis not present

## 2022-02-28 DIAGNOSIS — R0981 Nasal congestion: Secondary | ICD-10-CM

## 2022-02-28 LAB — POCT RAPID STREP A (OFFICE): Rapid Strep A Screen: NEGATIVE

## 2022-02-28 MED ORDER — AZITHROMYCIN 250 MG PO TABS: ORAL_TABLET | ORAL | 0 refills | Status: AC

## 2022-02-28 MED ORDER — FLUCONAZOLE 150 MG PO TABS: 150.0000 mg | ORAL_TABLET | Freq: Every day | ORAL | 0 refills | Status: DC

## 2022-02-28 MED ORDER — FLUTICASONE PROPIONATE 50 MCG/ACT NA SUSP: 2.0000 | Freq: Every day | NASAL | 1 refills | Status: AC

## 2022-02-28 MED ORDER — BENZONATATE 100 MG PO CAPS: 100.0000 mg | ORAL_CAPSULE | Freq: Three times a day (TID) | ORAL | 0 refills | Status: DC | PRN

## 2022-02-28 NOTE — Patient Instructions (Signed)
April Rodgers type symptoms with mild maxillary sinus pressure and st. Rapid strep test negative. On exam throat looks normal but pain out proportion to exam.  Recommend rest, hydrate and tylenol for pain or fever  Use otc lozenge. Flonase for nasal congestion and benzonatate for cough.  If your symptoms persist or worsen by Friday then start azithromycin antibiotic.  Follow up in 7-10 days or sooner if needed

## 2022-02-28 NOTE — Progress Notes (Signed)
Subjective:    Patient ID: April Rodgers, female    DOB: 09/15/1985, 37 y.o.   MRN: [id number]  HPI Pt in stating recent sore throat, nasal congestion , some sinus pressure and some cough. Pt states throat feels warm. Pain in submandibular area.  Pt has been taking theraflu and tylenol.  Day 5 of symptoms.  Pt notes hurts a lot to swallow her saliva.   Lmp- 2nd week of January.      Review of Systems  Constitutional:  Negative for chills, fatigue and fever.  HENT:  Positive for congestion, sinus pressure, sinus pain and sore throat. Negative for ear pain, trouble swallowing and voice change.   Respiratory:  Negative for cough, chest tightness, shortness of breath and wheezing.   Cardiovascular:  Negative for chest pain and palpitations.  Gastrointestinal:  Negative for abdominal pain, rectal pain and vomiting.  Genitourinary:  Negative for dysuria.  Musculoskeletal:  Negative for back pain.  Neurological:  Negative for dizziness, seizures, weakness and headaches.  Hematological:  Negative for adenopathy. Does not bruise/bleed easily.  Psychiatric/Behavioral:  Negative for behavioral problems and dysphoric mood.     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   History of chorioamnionitis    03-10-2015   History of fetal loss    03-10-2015  non-viable fetal demise twins at [redacted]w[redacted]d due to chorioamnionitis infection with PPROM   Hx of varicella    Hydrosalpinx    RIGHT   Migraine    Normal exercise tolerance test results    03-22-2006   Obstruction of fallopian tube    bilateral occlusion   Scoliosis      Social History   Socioeconomic History   Marital status: Married    Spouse name: Not on file   Number of children: Not on file   Years of  education: Not on file   Highest education level: Not on file  Occupational History   Not on file  Tobacco Use   Smoking status: Never    Passive exposure: Never   Smokeless tobacco: Never  Vaping Use   Vaping Use: Never used  Substance and Sexual Activity   Alcohol use: No   Drug use: No   Sexual activity: Yes  Other Topics Concern   Not on file  Social History Narrative   Not on file   Social Determinants of Health   Financial Resource Strain: Not on file  Food Insecurity: Not on file  Transportation Needs: Not on file  Physical Activity: Not on file  Stress: Not on file  Social Connections: Not on file  Intimate Partner Violence: Not on file    Past Surgical History:  Procedure Laterality Date   DILATION AND CURETTAGE OF UTERUS  2006   HYSTEROSCOPY WITH D & C N/A 06/28/2015   Procedure: SUCTION DILATATION AND CURETTAGE /HYSTEROSCOPY, ;  Surgeon: Governor Specking, MD;  Location: [place];  Service: Gynecology;  Laterality: N/A;   LAPAROSCOPY Left 06/28/2015   Procedure: LAPAROSCOPY LYSIS OF ADHESIONS, RIGHT SALPINGECTOMY WITH NOVY CATHERIZATION OF LEFT TUBE,EXCISION OF ENDOMETROSIS;  Surgeon: Governor Specking, MD;  Location: [place];  Service: Gynecology;  Laterality: Left;   TONSILLECTOMY  2012   WISDOM TOOTH EXTRACTION  2016    Family History  Problem Relation Age of Onset   Anemia Mother    Allergies Son    Allergies Son    Pneumonia Son    Allergies Daughter     Allergies  Allergen Reactions   Penicillins Hives and Rash    Has patient had a PCN reaction causing immediate rash, facial/tongue/throat swelling, SOB or lightheadedness with hypotension: Yes Has patient had a PCN reaction causing severe rash involving mucus membranes or skin necrosis: No Has patient had a PCN reaction that required hospitalization No Has patient had a PCN reaction occurring within the last 10 years: No If all of the above answers are "NO",  then may proceed with Cephalosporin use.     Current Outpatient Medications on File Prior to Visit  Medication Sig Dispense Refill   acetaminophen (TYLENOL) 500 MG tablet Take 1,000 mg by mouth every 6 (six) hours as needed for moderate pain or fever.     fluticasone (FLONASE) 50 MCG/ACT nasal spray Place 2 sprays into both nostrils daily. (Patient taking differently: Place 2 sprays into both nostrils as needed.) 16 g 1   IBUPROFEN PO Take by mouth as needed.     olopatadine (PATANOL) 0.1 % ophthalmic solution Place 1 drop into both eyes 2 (two) times daily. 5 mL 4   sertraline (ZOLOFT) 50 MG tablet Take 50 mg by mouth daily.     Vitamin D, Ergocalciferol, (DRISDOL) 1.25 MG (50000 UNIT) CAPS capsule TAKE 1 CAPSULE (50,000 UNITS TOTAL) BY MOUTH EVERY 7 (SEVEN) DAYS 4 capsule 1   Vitamin D, Ergocalciferol, (DRISDOL) 1.25 MG (50000 UNIT) CAPS capsule Take 1 capsule (50,000 Units total) by mouth 2 (two) times a week. 24 capsule 0   No current facility-administered medications on file prior to visit.    BP 102/76   Pulse 87   Temp 98.5 F (36.9 C)   Resp 18   Ht 5' 9.5" (1.765 m)   Wt 158 lb (71.7 kg)   SpO2 100%   BMI 23.00 kg/m        Objective:   Physical Exam  General Mental Status- Alert. General Appearance- Not in acute distress.   Skin General: Color- Normal Color. Moisture- Normal Moisture.  Neck Carotid Arteries- Normal color. Moisture- Normal Moisture. No carotid bruits. No JVD.  Chest and Lung Exam Auscultation: Breath Sounds:-Normal.  Cardiovascular Auscultation:Rythm- Regular. Murmurs & Other Heart Sounds:Auscultation of the heart reveals- No Murmurs.  Abdomen Inspection:-Inspeection Normal. Palpation/Percussion:Note:No mass. Palpation and Percussion of the abdomen reveal- Non Tender, Non Distended + BS, no rebound or guarding.  Neurologic Cranial Nerve exam:- CN III-XII intact(No nystagmus), symmetric smile. Strength:- 5/5 equal and symmetric strength  both upper and lower extremities.   Heent- moderate boggy turbinates. Maxillary sinus pressure. Rt canal clear and nml tm. Left canal- wax blocking view of tm.     Assessment & Plan:   Patient Instructions  Samuel [place] type symptoms with mild maxillary sinus pressure and st. Rapid strep test negative. On exam throat looks normal but pain out proportion to exam.  Recommend rest, hydrate and tylenol for pain or fever  Use otc lozenge. Flonase for nasal congestion and benzonatate for cough.  If your symptoms persist or worsen by Friday then start azithromycin antibiotic.  Follow up in 7-10 days or sooner if needed    [employer] PA-C  Signs and symptoms not suspcious for covid or flu. Note day 5 of symptoms as  well.

## 2022-02-28 NOTE — Addendum Note (Signed)
Addended by: Anabel Halon on: 02/28/2022 12:04 PM   Modules accepted: Orders

## 2022-03-07 ENCOUNTER — Telehealth: Payer: Self-pay | Admitting: Hematology and Oncology

## 2022-03-07 ENCOUNTER — Encounter: Payer: Self-pay | Admitting: Rheumatology

## 2022-03-07 ENCOUNTER — Ambulatory Visit: Payer: BC Managed Care – PPO | Attending: Rheumatology | Admitting: Rheumatology

## 2022-03-07 VITALS — BP 106/72 | HR 79 | Resp 12 | Ht 69.0 in | Wt 160.6 lb

## 2022-03-07 DIAGNOSIS — G8929 Other chronic pain: Secondary | ICD-10-CM

## 2022-03-07 DIAGNOSIS — Z862 Personal history of diseases of the blood and blood-forming organs and certain disorders involving the immune mechanism: Secondary | ICD-10-CM

## 2022-03-07 DIAGNOSIS — M79671 Pain in right foot: Secondary | ICD-10-CM | POA: Diagnosis not present

## 2022-03-07 DIAGNOSIS — M79672 Pain in left foot: Secondary | ICD-10-CM

## 2022-03-07 DIAGNOSIS — M1812 Unilateral primary osteoarthritis of first carpometacarpal joint, left hand: Secondary | ICD-10-CM

## 2022-03-07 DIAGNOSIS — M545 Low back pain, unspecified: Secondary | ICD-10-CM

## 2022-03-07 DIAGNOSIS — M79641 Pain in right hand: Secondary | ICD-10-CM

## 2022-03-07 DIAGNOSIS — M7918 Myalgia, other site: Secondary | ICD-10-CM

## 2022-03-07 DIAGNOSIS — R21 Rash and other nonspecific skin eruption: Secondary | ICD-10-CM

## 2022-03-07 DIAGNOSIS — D649 Anemia, unspecified: Secondary | ICD-10-CM

## 2022-03-07 DIAGNOSIS — R5383 Other fatigue: Secondary | ICD-10-CM

## 2022-03-07 DIAGNOSIS — E559 Vitamin D deficiency, unspecified: Secondary | ICD-10-CM

## 2022-03-07 DIAGNOSIS — M546 Pain in thoracic spine: Secondary | ICD-10-CM

## 2022-03-07 DIAGNOSIS — M2550 Pain in unspecified joint: Secondary | ICD-10-CM

## 2022-03-07 DIAGNOSIS — F5101 Primary insomnia: Secondary | ICD-10-CM

## 2022-03-07 DIAGNOSIS — Z8669 Personal history of other diseases of the nervous system and sense organs: Secondary | ICD-10-CM

## 2022-03-07 DIAGNOSIS — M79642 Pain in left hand: Secondary | ICD-10-CM

## 2022-03-07 DIAGNOSIS — M4125 Other idiopathic scoliosis, thoracolumbar region: Secondary | ICD-10-CM

## 2022-03-07 DIAGNOSIS — F419 Anxiety disorder, unspecified: Secondary | ICD-10-CM

## 2022-03-07 NOTE — Telephone Encounter (Signed)
Scheduled appt per 1/31 referral. Pt is aware of appt date and time. Pt is aware to arrive 15 mins prior to appt time and to bring and updated insurance card. Pt is aware of appt location.

## 2022-03-08 ENCOUNTER — Encounter: Payer: Self-pay | Admitting: Allergy

## 2022-03-08 ENCOUNTER — Other Ambulatory Visit: Payer: Self-pay

## 2022-03-08 ENCOUNTER — Ambulatory Visit (INDEPENDENT_AMBULATORY_CARE_PROVIDER_SITE_OTHER): Payer: Self-pay | Admitting: Allergy

## 2022-03-08 VITALS — BP 112/74 | HR 76 | Temp 98.9°F | Resp 16 | Ht 69.5 in | Wt 157.9 lb

## 2022-03-08 DIAGNOSIS — T781XXA Other adverse food reactions, not elsewhere classified, initial encounter: Secondary | ICD-10-CM

## 2022-03-08 DIAGNOSIS — J3089 Other allergic rhinitis: Secondary | ICD-10-CM | POA: Diagnosis not present

## 2022-03-08 DIAGNOSIS — L508 Other urticaria: Secondary | ICD-10-CM

## 2022-03-08 DIAGNOSIS — L299 Pruritus, unspecified: Secondary | ICD-10-CM | POA: Diagnosis not present

## 2022-03-08 MED ORDER — RYALTRIS 665-25 MCG/ACT NA SUSP: NASAL | 5 refills | Status: AC

## 2022-03-08 MED ORDER — FAMOTIDINE 20 MG PO TABS: 20.0000 mg | ORAL_TABLET | Freq: Two times a day (BID) | ORAL | 2 refills | Status: DC

## 2022-03-08 MED ORDER — FEXOFENADINE HCL 180 MG PO TABS: 180.0000 mg | ORAL_TABLET | Freq: Two times a day (BID) | ORAL | 2 refills | Status: DC

## 2022-03-08 NOTE — Patient Instructions (Addendum)
Chronic hives  - at this time etiology of hives and swelling is unknown.  Hives can be caused by a variety of different triggers including illness/infection, foods, medications, stings, exercise, pressure, vibrations, extremes of temperature to name a few however majority of the time there is no identifiable trigger.  Your symptoms have been ongoing for >6 weeks making this chronic thus will obtain labwork to evaluate: CBC w diff, CMP, tryptase, hive panel, environmental panel, alpha-gal panel, inflammatory markers  - for management of hives recommend high-dose antihistamine regimen: Allegra 180mg  1 tab twice a day with Pepcid 20mg  1 tab twice a day.   Allegra is a histamine 1 (H1) blocker and Pepcid is a histamine 2 (H2) blocker If high-dose antihistamine regimen is not effective enough in controlling hives then consider starting Xolair monthly injections for chronic spontaneous hive control.    Environmental allergy - will obtain environmental allergy panel - use Allegra as above for allergy symptom control - use Ryaltris 2 sprays each nostril twice a day as needed for runny or stuffy nose.  Sample provided.   Oral symptoms with citrus foods - The oral allergy syndrome (OAS) or pollen-food allergy syndrome (PFAS) is a relatively common form of food allergy, particularly in adults. It typically occurs in people who have pollen allergies when the immune system "sees" proteins on the food that look like proteins on the pollen. This results in the allergy antibody (IgE) binding to the food instead of the pollen. Patients typically report itching and/or mild swelling of the mouth and throat immediately following ingestion of certain uncooked fruits (including nuts) or raw vegetables. Only a very small number of affected individuals experience systemic allergic reactions, such as anaphylaxis which occurs with true food allergies.   - if you are pollen allergic and citrus allergy testing is negative then  this is what is happening with citrus fruits      Follow-up in 2-3 months or sooner if needed

## 2022-03-08 NOTE — Progress Notes (Signed)
New Patient Note  RE: AHMARI GARTON MRN: [id number] DOB: [birth year] Date of Office Visit: 03/08/2022   Primary care provider: Mackie Pai, PA-C  Chief Complaint: hives  History of present illness: April Rodgers is a 37 y.o. female presenting today for evaluation of hives.   She states she has been having hives and itching of her eyes and ears. She states the rash started around November 2023 but has been having the itching for the past year.  She states the hives appear and can have multiple red bumps that coalesce.  She states some can look like mosquito bites.  She has had the rash all over the body.  The rash itches.  She states the rash in different areas can last 3-5 days. Hives not leaving any bruising.  No associated swelling.  No fevers.  No worsening joint aches/pains with rash but does report joint aches of hands and feet at baseline.  She states gets sick often with like strep throat, URIs thus she is not sure if the hives are related to this.  She does have some concern for bites/stings.  She states when she does have citrus foods her throat itches and she gets hoarse but she drinks more OJ when she is sick.  No change in soaps/detergents/body products.   She has tried benadryl and cortisone cream and seems to help some but the rash keeps coming back.  She has taken xyzal for the itchiness and doesn't feel it helped.    No history of eczema, asthma, food allergy.  She does report seasonal allergy with congestion, runny nose, allergic shiners, drainage in throat.  Xyzal doesn't seem to help this thus she doesn't take this anymore.  She will use flonase that helps somewhat.    Review of systems: Review of Systems  Constitutional: Negative.   HENT:         See HPI  Eyes: Negative.   Respiratory: Negative.    Cardiovascular: Negative.   Gastrointestinal: Negative.   Musculoskeletal: Negative.   Skin:  Positive for rash.  Allergic/Immunologic: Negative.    Neurological: Negative.     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   History of chorioamnionitis    03-10-2015   History of fetal loss    03-10-2015  non-viable fetal demise twins at [redacted]w[redacted]d due to chorioamnionitis infection with PPROM   Hx of varicella    Hydrosalpinx    RIGHT   Migraine    Normal exercise tolerance test results    03-22-2006   Obstruction of fallopian tube    bilateral occlusion   Scoliosis     Past surgical history: Past Surgical History:  Procedure Laterality Date   DILATION AND CURETTAGE OF UTERUS  2006   HYSTEROSCOPY WITH D & C N/A 06/28/2015   Procedure: SUCTION DILATATION AND CURETTAGE /HYSTEROSCOPY, ;  Surgeon: Governor Specking, MD;  Location: [facility];  Service: Gynecology;  Laterality: N/A;   LAPAROSCOPY Left 06/28/2015   Procedure: LAPAROSCOPY LYSIS OF ADHESIONS, RIGHT SALPINGECTOMY WITH NOVY CATHERIZATION OF LEFT TUBE,EXCISION OF ENDOMETROSIS;  Surgeon: Governor Specking, MD;  Location: [facility]  [place];  Service: Gynecology;  Laterality: Left;   TONSILLECTOMY  2012   WISDOM TOOTH EXTRACTION  2016    Family history:  Family History  Problem Relation Age of Onset   Anemia Mother    Allergic rhinitis Daughter    Eczema Daughter    Allergies Daughter    Allergic rhinitis Son    Allergies Son    Allergies Son    Pneumonia Son     Social history: Lives in an apartment with carpeting with electric heating and central cooling.  No pets in the home.  There is no concern for water damage, mildew or roaches in the home.  She is team-lead which involves managing teams.  She denies a smoking history.   Medication List: Current  Outpatient Medications  Medication Sig Dispense Refill   acetaminophen (TYLENOL) 500 MG tablet Take 1,000 mg by mouth every 6 (six) hours as needed for moderate pain or fever.     benzonatate (TESSALON) 100 MG capsule Take 1 capsule (100 mg total) by mouth 3 (three) times daily as needed for cough. 30 capsule 0   fluticasone (FLONASE) 50 MCG/ACT nasal spray Place 2 sprays into both nostrils daily. 16 g 1   IBUPROFEN PO Take by mouth as needed.     olopatadine (PATANOL) 0.1 % ophthalmic solution Place 1 drop into both eyes 2 (two) times daily. 5 mL 4   sertraline (ZOLOFT) 50 MG tablet Take 50 mg by mouth daily.     Vitamin D, Ergocalciferol, (DRISDOL) 1.25 MG (50000 UNIT) CAPS capsule TAKE 1 CAPSULE (50,000 UNITS TOTAL) BY MOUTH EVERY 7 (SEVEN) DAYS 4 capsule 1   fluconazole (DIFLUCAN) 150 MG tablet Take 1 tablet (150 mg total) by mouth daily. May repeat in 3 days if needed. (Patient not taking: Reported on 03/07/2022) 2 tablet 0   fluticasone (FLONASE) 50 MCG/ACT nasal spray Place 2 sprays into both nostrils daily. (Patient taking differently: Place 2 sprays into both nostrils as needed.) 16 g 1   Vitamin D, Ergocalciferol, (DRISDOL) 1.25 MG (50000 UNIT) CAPS capsule Take 1 capsule (50,000 Units total) by mouth 2 (two) times a week. 24 capsule 0   No current facility-administered medications for this visit.    Known medication allergies: Allergies  Allergen Reactions   Penicillins Hives and Rash    Has patient had a PCN reaction causing immediate rash, facial/tongue/throat swelling, SOB or lightheadedness with hypotension: Yes Has patient had a PCN reaction causing severe rash involving mucus membranes or skin necrosis: No Has patient had a PCN reaction that required hospitalization No Has patient had a PCN reaction occurring within the last 10 years: No If all of the above answers are "NO", then may proceed with Cephalosporin use.      Physical examination: Blood pressure 112/74, pulse  76, temperature 98.9 F (37.2 C), temperature source Temporal, resp. rate 16, height 5' 9.5" (1.765 m), weight 157 lb 14.4 oz (71.6 kg), SpO2 99 %.  General: Alert, interactive, in no acute distress. HEENT: PERRLA, TMs pearly gray, turbinates minimally edematous without discharge, post-pharynx non erythematous. Neck: Supple without lymphadenopathy. Lungs: Clear to auscultation without wheezing, rhonchi or rales. {no increased work of breathing. CV: Normal S1, S2 without murmurs. Abdomen: Nondistended, nontender. Skin: Warm and dry, without lesions or rashes. Extremities:  No clubbing, cyanosis or edema. Neuro:   Grossly intact.  Diagnositics/Labs: None today due to ongoing urticaria  Assessment and plan: Chronic urticaria - at this time etiology of hives and  swelling is unknown.  Hives can be caused by a variety of different triggers including illness/infection, foods, medications, stings, exercise, pressure, vibrations, extremes of temperature to name a few however majority of the time there is no identifiable trigger.  Your symptoms have been ongoing for >6 weeks making this chronic thus will obtain labwork to evaluate: CBC w diff, CMP, tryptase, hive panel, environmental panel, alpha-gal panel, inflammatory markers  - for management of hives recommend high-dose antihistamine regimen: Allegra 180mg  1 tab twice a day with Pepcid 20mg  1 tab twice a day.   Allegra is a histamine 1 (H1) blocker and Pepcid is a histamine 2 (H2) blocker If high-dose antihistamine regimen is not effective enough in controlling hives then consider starting Xolair monthly injections for chronic spontaneous hive control.    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  Sample provided.   Oral symptoms with citrus foods - The oral allergy syndrome (OAS)  or pollen-food allergy syndrome (PFAS) is a relatively common form of food allergy, particularly in adults. It typically occurs in people who have pollen allergies when the immune system "sees" proteins on the food that look like proteins on the pollen. This results in the allergy antibody (IgE) binding to the food instead of the pollen. Patients typically report itching and/or mild swelling of the mouth and throat immediately following ingestion of certain uncooked fruits (including nuts) or raw vegetables. Only a very small number of affected individuals experience systemic allergic reactions, such as anaphylaxis which occurs with true food allergies.   - if you are pollen allergic and citrus allergy testing is negative then this is what is happening with citrus fruits  Follow-up in 2-3 months or sooner if needed  I appreciate the opportunity to take part in [place] care. Please do not hesitate to contact me with questions.  Sincerely,   Prudy Feeler, MD Allergy/Immunology [facility]

## 2022-03-16 ENCOUNTER — Encounter: Payer: Self-pay | Admitting: Allergy

## 2022-03-16 LAB — ALPHA-GAL PANEL
Allergen Lamb IgE: <0.1 kU/L
Beef IgE: <0.1 kU/L
IgE (Immunoglobulin E), Serum: <2 IU/mL — ABNORMAL LOW (ref 6–495)
O215-IgE Alpha-Gal: <0.1 kU/L
Pork IgE: <0.1 kU/L

## 2022-03-16 LAB — ALLERGENS W/TOTAL IGE AREA 2

## 2022-03-16 LAB — CBC WITH DIFFERENTIAL
Basophils Absolute: 0 10*3/uL (ref 0.0–0.2)
Basos: 0 %
EOS (ABSOLUTE): 0 10*3/uL (ref 0.0–0.4)
Eos: 1 %
Hematocrit: 33.4 % — ABNORMAL LOW (ref 34.0–46.6)
Hemoglobin: 10.6 g/dL — ABNORMAL LOW (ref 11.1–15.9)
Immature Grans (Abs): 0 10*3/uL (ref 0.0–0.1)
Immature Granulocytes: 0 %
Lymphocytes Absolute: 2 10*3/uL (ref 0.7–3.1)
Lymphs: 56 %
MCH: 25 pg — ABNORMAL LOW (ref 26.6–33.0)
MCHC: 31.7 g/dL (ref 31.5–35.7)
MCV: 79 fL (ref 79–97)
Monocytes Absolute: 0.3 10*3/uL (ref 0.1–0.9)
Monocytes: 8 %
Neutrophils Absolute: 1.3 10*3/uL — ABNORMAL LOW (ref 1.4–7.0)
Neutrophils: 35 %
RBC: 4.24 x10E6/uL (ref 3.77–5.28)
RDW: 18.9 % — ABNORMAL HIGH (ref 11.7–15.4)
WBC: 3.7 10*3/uL (ref 3.4–10.8)

## 2022-03-16 LAB — COMPREHENSIVE METABOLIC PANEL
ALT: 10 IU/L (ref 0–32)
AST: 13 IU/L (ref 0–40)
Albumin/Globulin Ratio: 1.4 (ref 1.2–2.2)
Albumin: 4.2 g/dL (ref 3.9–4.9)
Alkaline Phosphatase: 65 IU/L (ref 44–121)
BUN/Creatinine Ratio: 6 — ABNORMAL LOW (ref 9–23)
BUN: 5 mg/dL — ABNORMAL LOW (ref 6–20)
Bilirubin Total: 0.4 mg/dL (ref 0.0–1.2)
CO2: 21 mmol/L (ref 20–29)
Calcium: 9.2 mg/dL (ref 8.7–10.2)
Chloride: 106 mmol/L (ref 96–106)
Creatinine, Ser: 0.77 mg/dL (ref 0.57–1.00)
Globulin, Total: 3 g/dL (ref 1.5–4.5)
Glucose: 85 mg/dL (ref 70–99)
Potassium: 4.8 mmol/L (ref 3.5–5.2)
Sodium: 141 mmol/L (ref 134–144)
Total Protein: 7.2 g/dL (ref 6.0–8.5)
eGFR: 103 mL/min/{1.73_m2} (ref >59)

## 2022-03-16 LAB — THYROID ANTIBODIES
Thyroglobulin Antibody: <1 IU/mL (ref 0.0–0.9)
Thyroperoxidase Ab SerPl-aCnc: 13 IU/mL (ref 0–34)

## 2022-03-16 LAB — CHRONIC URTICARIA: cu index: 2.1 (ref <10)

## 2022-03-16 LAB — SEDIMENTATION RATE: Sed Rate: 29 mm/hr (ref 0–32)

## 2022-03-16 LAB — TRYPTASE: Tryptase: 5.8 ug/L (ref 2.2–13.2)

## 2022-03-16 LAB — TSH: TSH: 1.33 u[IU]/mL (ref 0.450–4.500)

## 2022-03-16 LAB — ALLERGEN, ORANGE F33: Orange: <0.1 kU/L

## 2022-03-26 ENCOUNTER — Inpatient Hospital Stay: Payer: BC Managed Care – PPO

## 2022-03-26 ENCOUNTER — Encounter: Payer: Self-pay | Admitting: Hematology and Oncology

## 2022-03-26 ENCOUNTER — Inpatient Hospital Stay: Payer: BC Managed Care – PPO | Attending: Hematology and Oncology | Admitting: Hematology and Oncology

## 2022-03-26 VITALS — BP 113/67 | HR 81 | Temp 98.3°F | Resp 18 | Ht 69.5 in | Wt 157.6 lb

## 2022-03-26 DIAGNOSIS — F5089 Other specified eating disorder: Secondary | ICD-10-CM

## 2022-03-26 DIAGNOSIS — M2550 Pain in unspecified joint: Secondary | ICD-10-CM

## 2022-03-26 DIAGNOSIS — K909 Intestinal malabsorption, unspecified: Secondary | ICD-10-CM | POA: Diagnosis not present

## 2022-03-26 DIAGNOSIS — Z79899 Other long term (current) drug therapy: Secondary | ICD-10-CM

## 2022-03-26 DIAGNOSIS — D500 Iron deficiency anemia secondary to blood loss (chronic): Secondary | ICD-10-CM

## 2022-03-26 DIAGNOSIS — D509 Iron deficiency anemia, unspecified: Secondary | ICD-10-CM

## 2022-03-26 LAB — CBC WITH DIFFERENTIAL (CANCER CENTER ONLY)
Abs Immature Granulocytes: 0 10*3/uL (ref 0.00–0.07)
Basophils Absolute: 0 10*3/uL (ref 0.0–0.1)
Basophils Relative: 1 %
Eosinophils Absolute: 0 10*3/uL (ref 0.0–0.5)
Eosinophils Relative: 1 %
HCT: 31.6 % — ABNORMAL LOW (ref 36.0–46.0)
Hemoglobin: 10.5 g/dL — ABNORMAL LOW (ref 12.0–15.0)
Immature Granulocytes: 0 %
Lymphocytes Relative: 53 %
Lymphs Abs: 1.8 10*3/uL (ref 0.7–4.0)
MCH: 25.4 pg — ABNORMAL LOW (ref 26.0–34.0)
MCHC: 33.2 g/dL (ref 30.0–36.0)
MCV: 76.3 fL — ABNORMAL LOW (ref 80.0–100.0)
Monocytes Absolute: 0.4 10*3/uL (ref 0.1–1.0)
Monocytes Relative: 11 %
Neutro Abs: 1.2 10*3/uL — ABNORMAL LOW (ref 1.7–7.7)
Neutrophils Relative %: 34 %
Platelet Count: 213 10*3/uL (ref 150–400)
RBC: 4.14 MIL/uL (ref 3.87–5.11)
RDW: 20.4 % — ABNORMAL HIGH (ref 11.5–15.5)
WBC Count: 3.4 10*3/uL — ABNORMAL LOW (ref 4.0–10.5)
nRBC: 0 % (ref 0.0–0.2)

## 2022-03-26 LAB — IRON AND IRON BINDING CAPACITY (CC-WL,HP ONLY)
Iron: 35 ug/dL (ref 28–170)
Saturation Ratios: 9 % — ABNORMAL LOW (ref 10.4–31.8)
TIBC: 389 ug/dL (ref 250–450)
UIBC: 354 ug/dL (ref 148–442)

## 2022-03-26 LAB — FERRITIN: Ferritin: 4 ng/mL — ABNORMAL LOW (ref 11–307)

## 2022-03-26 NOTE — Assessment & Plan Note (Signed)
The most likely cause of her anemia is due to chronic blood loss/malabsorption syndrome. We discussed some of the risks, benefits, and alternatives of intravenous iron infusions. The patient is symptomatic from anemia and the iron level is critically low. She tolerated oral iron supplement poorly and desires to achieved higher levels of iron faster for adequate hematopoesis. Some of the side-effects to be expected including risks of infusion reactions, phlebitis, headaches, nausea and fatigue.  The patient is willing to proceed. Patient education material was dispensed.  Goal is to keep ferritin level greater than 50 and resolution of anemia Recommend IV iron sucrose 400 mg x 3 I will see her back in 2 months for further follow-up

## 2022-03-26 NOTE — Progress Notes (Signed)
[facility] CONSULT NOTE  Patient Care Team: Saguier, Iris Pert as PCP - General (Internal Medicine)  ASSESSMENT & PLAN:  Iron deficiency anemia The most likely cause of her anemia is due to chronic blood loss/malabsorption syndrome. We discussed some of the risks, benefits, and alternatives of intravenous iron infusions. The patient is symptomatic from anemia and the iron level is critically low. She tolerated oral iron supplement poorly and desires to achieved higher levels of iron faster for adequate hematopoesis. Some of the side-effects to be expected including risks of infusion reactions, phlebitis, headaches, nausea and fatigue.  The patient is willing to proceed. Patient education material was dispensed.  Goal is to keep ferritin level greater than 50 and resolution of anemia Recommend IV iron sucrose 400 mg x 3 I will see her back in 2 months for further follow-up Orders Placed This Encounter  Procedures   Iron and Iron Binding Capacity ([facility] only)    Standing Status:   Future    Standing Expiration Date:   03/27/2023   Ferritin    Standing Status:   Future    Standing Expiration Date:   03/26/2023   CBC with Differential ([facility] Only)    Standing Status:   Future    Standing Expiration Date:   03/27/2023    All questions were answered. The patient knows to call the clinic with any problems, questions or concerns.  The total time spent in the appointment was 60 minutes encounter with patients including review of chart and various tests results, discussions about plan of care and coordination of care plan  Heath Lark, MD 2/19/20249:47 AM   CHIEF COMPLAINTS/PURPOSE OF CONSULTATION:  Anemia  HISTORY OF PRESENTING ILLNESS:  April Rodgers 37 y.o. female is here because of anemia  She was found to have abnormal CBC from recent blood draw I have the opportunity to review her CBC dated back to 2009 She has intermittent fluctuation of her blood  count, with her blood count as low as 7.1 to normal at 12.9  She denies recent chest pain on exertion, shortness of breath on minimal exertion, pre-syncopal episodes, or palpitations.  However, she complained of excessive fatigue, difficulties performing activities of daily living and frequent vertigo She had not noticed any recent bleeding such as epistaxis, hematuria or hematochezia The patient takes ibuprofen intermittently for joint pain.  She is not on antiplatelets agents.  She had no prior history or diagnosis of cancer. Her age appropriate screening programs are up-to-date. She has pica with craving of ice and eats a variety of diet. 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   History of chorioamnionitis    03-10-2015   History of fetal loss    03-10-2015  non-viable fetal demise twins at [id number] to chorioamnionitis infection with PPROM   Hx of varicella    Hydrosalpinx    RIGHT   Migraine    Normal exercise tolerance test results    03-22-2006   Obstruction of fallopian tube  bilateral occlusion   Scoliosis     SURGICAL HISTORY: Past Surgical History:  Procedure Laterality Date   DILATION AND CURETTAGE OF UTERUS  2006   HYSTEROSCOPY WITH D & C N/A 06/28/2015   Procedure: SUCTION DILATATION AND CURETTAGE /HYSTEROSCOPY, ;  Surgeon: Governor Specking, MD;  Location: [facility];  Service: Gynecology;  Laterality: N/A;   LAPAROSCOPY Left 06/28/2015   Procedure: LAPAROSCOPY LYSIS OF ADHESIONS, RIGHT SALPINGECTOMY WITH NOVY CATHERIZATION OF LEFT TUBE,EXCISION OF  ENDOMETROSIS;  Surgeon: Governor Specking, MD;  Location: [place];  Service: Gynecology;  Laterality: Left;   TONSILLECTOMY  2012   WISDOM TOOTH EXTRACTION  2016    SOCIAL HISTORY: Social History   Socioeconomic History   Marital status: Married    Spouse name: Not on file   Number of children: Not on file   Years of education: Not on file   Highest education level: Not on file  Occupational History   Not on file  Tobacco Use   Smoking status: Never    Passive exposure: Never   Smokeless tobacco: Never  Vaping Use   Vaping Use: Never used  Substance and Sexual Activity   Alcohol use: No   Drug use: No   Sexual activity: Yes  Other Topics Concern   Not on file  Social History Narrative   Not on file   Social Determinants of Health   Financial Resource Strain: Not on file  Food Insecurity: Not on file  Transportation Needs: Not on file  Physical Activity: Not on file  Stress: Not on file  Social Connections: Not on file  Intimate Partner Violence: Not on file    FAMILY HISTORY: Family History  Problem Relation Age of Onset   Anemia Mother    Allergic rhinitis Daughter    Eczema Daughter    Allergies Daughter    Allergic rhinitis Son    Allergies Son    Allergies Son    Pneumonia Son     ALLERGIES:  is allergic to penicillins.  MEDICATIONS:  Current Outpatient Medications  Medication Sig Dispense Refill   acetaminophen (TYLENOL) 500 MG tablet Take 1,000 mg by mouth every 6 (six) hours as needed for moderate pain or fever.     benzonatate (TESSALON) 100 MG capsule Take 1 capsule (100 mg total) by mouth 3 (three) times daily as needed for cough. 30 capsule 0   famotidine (PEPCID) 20 MG tablet Take 1 tablet (20 mg total) by mouth 2 (two) times daily. 60 tablet 2   fexofenadine (ALLEGRA) 180 MG tablet Take 1 tablet (180 mg total) by mouth in the morning and at bedtime. 60 tablet 2   fluticasone (FLONASE) 50 MCG/ACT nasal spray Place 2 sprays  into both nostrils daily. 16 g 1   IBUPROFEN PO Take by mouth as needed.     olopatadine (PATANOL) 0.1 % ophthalmic solution Place 1 drop into both eyes 2 (two) times daily. 5 mL 4   RYALTRIS 665-25 MCG/ACT SUSP 2 sprays each nostril twice a day as needed for runny or stuffy nose 29 g 5   sertraline (ZOLOFT) 50 MG tablet Take 50 mg by mouth daily.     Vitamin D, Ergocalciferol, (DRISDOL) 1.25 MG (50000 UNIT) CAPS capsule TAKE 1 CAPSULE (50,000 UNITS TOTAL) BY MOUTH EVERY 7 (SEVEN) DAYS 4 capsule 1   No current facility-administered medications for this visit.    REVIEW OF SYSTEMS:   Constitutional: Denies fevers, chills or abnormal night sweats Eyes:  Denies blurriness of vision, double vision or watery eyes Ears, nose, mouth, throat, and face: Denies mucositis or sore throat Respiratory: Denies cough, dyspnea or wheezes Cardiovascular: Denies palpitation, chest discomfort or lower extremity swelling Gastrointestinal:  Denies nausea, heartburn or change in bowel habits Skin: Denies abnormal skin rashes Lymphatics: Denies new lymphadenopathy or easy bruising Neurological:Denies numbness, tingling or new weaknesses Behavioral/Psych: Mood is stable, no new changes  All other systems were reviewed with the patient and are negative.  PHYSICAL EXAMINATION: ECOG PERFORMANCE STATUS: 1 - Symptomatic but completely ambulatory  Vitals:   03/26/22 0920  BP: 113/67  Pulse: 81  Resp: 18  Temp: 98.3 F (36.8 C)  SpO2: 98%   Filed Weights   03/26/22 0920  Weight: 157 lb 9.6 oz (71.5 kg)    GENERAL:alert, no distress and comfortable SKIN: skin color, texture, turgor are normal, no rashes or significant lesions EYES: normal, conjunctiva are pink and non-injected, sclera clear OROPHARYNX:no exudate, no erythema and lips, buccal mucosa, and tongue normal  NECK: supple, thyroid normal size, non-tender, without nodularity LYMPH:  no palpable lymphadenopathy in the cervical, axillary or  inguinal LUNGS: clear to auscultation and percussion with normal breathing effort HEART: regular rate & rhythm and no murmurs and no lower extremity edema ABDOMEN:abdomen soft, non-tender and normal bowel sounds Musculoskeletal:no cyanosis of digits and no clubbing  PSYCH: alert & oriented x 3 with fluent speech NEURO: no focal motor/sensory deficits

## 2022-03-26 NOTE — Therapy (Addendum)
OUTPATIENT PHYSICAL THERAPY THORACOLUMBAR EVALUATION + NO VISIT DISCHARGE (see below)   Patient Name: April Rodgers MRN: [id number] DOB:May 13, 1985, 37 y.o., female Today's Date: 03/27/2022  END OF SESSION:  PT End of Session - 03/27/22 1501     Visit Number 1    Number of Visits 17    Date for PT Re-Evaluation 05/22/22    Authorization Type BCBS    PT Start Time 1502    PT Stop Time 1551    PT Time Calculation (min) 49 min    Activity Tolerance Patient tolerated treatment well;No increased pain    Behavior During Therapy [facility] for tasks assessed/performed             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   History of chorioamnionitis    03-10-2015   History of fetal loss    03-10-2015  non-viable fetal demise twins at [redacted]w[redacted]d due to chorioamnionitis infection with PPROM   Hx of varicella    Hydrosalpinx    RIGHT   Migraine    Normal exercise tolerance test results    03-22-2006   Obstruction of fallopian tube    bilateral occlusion   Scoliosis    Past Surgical History:  Procedure Laterality Date   DILATION AND CURETTAGE OF UTERUS  2006   HYSTEROSCOPY WITH D & C N/A 06/28/2015   Procedure: SUCTION DILATATION AND CURETTAGE [name], ;  Surgeon: Fermin Schwab, MD;  Location: [facility];  Service: Gynecology;  Laterality: N/A;   LAPAROSCOPY Left 06/28/2015   Procedure: LAPAROSCOPY LYSIS OF ADHESIONS, RIGHT SALPINGECTOMY WITH NOVY CATHERIZATION OF LEFT TUBE,EXCISION OF ENDOMETROSIS;  Surgeon: Fermin Schwab, MD;  Location: [facility];  Service: Gynecology;  Laterality: Left;   TONSILLECTOMY  2012   WISDOM TOOTH EXTRACTION  2016   Patient Active Problem List   Diagnosis Date Noted   Iron deficiency anemia  03/26/2022   Pregnancy 07/05/2017   Term pregnancy 05/17/2016   Chorioamnionitis in second trimester 03/10/2015   Twin pregnancy, antepartum 03/10/2015   Vaginal delivery 03/10/2015   PROM (premature rupture of membranes) 03/04/2015    PCP: Esperanza Richters, PA-C  REFERRING PROVIDER: Pollyann Savoy, MD  REFERRING DIAG: M41.25 (ICD-10-CM) - Other idiopathic scoliosis, thoracolumbar region M54.6 (ICD-10-CM) - Pain in thoracic spine M54.50,G89.29 (ICD-10-CM) - Chronic midline low back pain without sciatica  Rationale for Evaluation and Treatment: Rehabilitation  THERAPY DIAG:  Pain in thoracic spine  Right shoulder pain, unspecified chronicity  Other low back pain  Muscle weakness (generalized)  ONSET DATE: a year or two  SUBJECTIVE:  SUBJECTIVE STATEMENT: Pt reports gradual onset, no MOI or reported changes in activity. Difficulty turning, stiffness.  Reports occasional numbness in hands and feet that occurred after onset of pain. Pt states she has been avoiding lifting items around the house due to pain/muscular fatigue.  Hx of migraines with photophobia and vomiting that are stable/infrequent, red flag questioning overall reassuring  PERTINENT HISTORY:  anemia, heart murmur, migraines  PAIN:  Are you having pain: 7/10 Location/description: R sided low-mid back and shoulder, sometimes into L; stiff, dull aching  Best-worst over past week: 7-9/10  Per eval -  - aggravating factors: turning, lying on her side, consistent,  - Easing factors: massage    PRECAUTIONS: None  WEIGHT BEARING RESTRICTIONS: No  FALLS:  Has patient fallen in last 6 months? 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   Thoracolumbar rotatory dextroscoliosis."   PATIENT SURVEYS:  FOTO 40% current, 59% predicted  SCREENING FOR RED FLAGS: Red flag questioning/screening reassuring    COGNITION: Overall cognitive status: Within functional limits for tasks assessed     SENSATION/NEURO: Light touch intact all extremities   Finger<>nose testing unremarkable, no dysdiadochokinesia No clonus either LE Negative hoffmann and tromner sign  No ataxia with gait  POSTURE: increased lordosis lumbar spine, increased thoracic kyphosis and rounded shoulders  PALPATION: Concordant discomfort w/ palpation of R levator scap, upper trap, rhomboid, and lat with pt reporting relief w/ brief STM. Concordant discomfort but less relieving with palpation of B QL/thoracolumbar paraspinals  LUMBAR ROM:   AROM eval  Flexion 50%  * (mid shin)  Extension 75% * (worse than flexion)  Right lateral flexion   Left lateral flexion   Right rotation 75%  Left rotation 50% *    (Blank rows = not tested) (Key: WFL = within functional limits not formally assessed, * = concordant pain, s = stiffness/stretching sensation, NT = not tested)  Comments: Cervical screen - rotation grossly symmetrical although increased pain bilaterally, worse/concordant  R vs L   UPPER EXTREMITY ROM:  Active ROM Right eval Left eval  Shoulder flexion    Shoulder abduction    Shoulder internal rotation    Shoulder external rotation    Elbow flexion    Elbow extension    Wrist flexion    Wrist extension     (Blank rows = not tested) Comments: Abd/flexion WFL B but abduction is painful at end range on R    UPPER EXTREMITY MMT:  MMT  Right eval Left eval  Shoulder flexion 4+ 4+  Shoulder extension    Shoulder abduction 4- 4-   Shoulder extension    Shoulder internal rotation 4+ 5  Shoulder external rotation 4+ 5  Elbow flexion    Elbow extension    Grip strength    (Blank rows = not tested)  (Key: WFL = within functional limits not formally assessed, * = concordant pain, s = stiffness/stretching sensation, NT = not tested)  Comments:   LOWER EXTREMITY MMT:    MMT Right eval Left eval  Hip flexion 4 4  Hip abduction (modified sitting) 4+ 4+  Hip internal rotation    Hip external rotation    Knee flexion    Knee extension     (Blank rows = not tested) (Key: WFL = within functional limits not formally assessed, * = concordant pain, s = stiffness/stretching sensation, NT = not tested)  Comments:     FUNCTIONAL TESTS:  Overhead reaching grossly symmetrical but increased pain with frontal plane elevation on R UE compared to L  GAIT: Distance walked: within clinic Assistive device utilized: None Level of assistance: Complete Independence Comments: gait mechanics grossly WNL  TODAY'S TREATMENT:                                                                                                                              [facility] Adult PT Treatment:                                                DATE: 03/27/22 Therapeutic Exercise: Trials of scapular retraction and shoulder shrugs, ~5 repetitions each w/ tactile/verbal cues as needed to improve form, discontinued due to persistent discomfort in shoulder that gradually worsens but resolves w/ rest  LS stretch 3x30sec to L only cues for form, breath control, and HEP Doorway rhomboid stretch x5 with 10sec hold cues for form, appropriate performance, and monitoring symptoms HEP handout and education on appropriate performance    PATIENT EDUCATION:  Education details: Pt education on PT impairments, prognosis, and POC. Informed consent. Rationale for interventions,  safe/appropriate HEP performance Person educated: Patient Education method: Explanation, Demonstration, Tactile cues, Verbal cues, and Handouts Education comprehension: verbalized understanding, returned demonstration, verbal cues required, tactile cues required, and needs further education    HOME EXERCISE PROGRAM: Access Code: [facility] URL: [email or web address] Date: 03/27/2022 Prepared by: Fransisco Hertz  Exercises - Doorway Rhomboid Stretch  - 1 x daily - 7 x weekly - 3 sets - 5 reps - 10sec hold - Gentle Levator Scapulae Stretch  - 1 x daily - 7 x weekly - 3 sets - 3 reps - 30sec hold  ASSESSMENT:  CLINICAL IMPRESSION: Patient is a pleasant 37 y.o. woman who was seen today for physical therapy evaluation and treatment for chronic back pain. Pt endorses most of her pain along R posterior shoulder/midback, less prevalent in B low back. Concordant pain is reproduced with palpation of periscapular musculature and paraspinal musculature (see above), pt reporting significant transient relief with brief STM. Pt also demonstrates pain/limitations in mobility as described above, muscle weakness R>L. Recommend skilled PT to address these deficits and maximize functional tolerance, pt tolerates HEP without increase in pain, cues/education as above. No adverse events, pt denies any change in pain on departure. Pt departs today's session in no acute distress, all voiced questions/concerns addressed appropriately from PT perspective.    OBJECTIVE IMPAIRMENTS:  decreased activity tolerance, decreased endurance, decreased mobility, decreased ROM, decreased strength, hypomobility, increased fascial restrictions, impaired UE functional use, postural dysfunction, and pain.   ACTIVITY LIMITATIONS: carrying, lifting, bending, sleeping, and reach over head  PARTICIPATION LIMITATIONS: cleaning, laundry, and shopping  PERSONAL FACTORS: Time since onset of injury/illness/exacerbation and 1-2  comorbidities: anemia, migraines  are also affecting patient's functional outcome.   REHAB POTENTIAL: Good  CLINICAL DECISION MAKING: Stable/uncomplicated  EVALUATION COMPLEXITY: Low   GOALS: Goals reviewed with patient? No  SHORT TERM GOALS: Target date: 04/24/2022 Pt will demonstrate appropriate understanding and performance of initially prescribed HEP in order to facilitate improved independence with management of symptoms.  Baseline: HEP provided on eval Goal status: INITIAL   2. Pt will score greater than or equal to 50 on FOTO in order to demonstrate improved perception of function due to symptoms.  Baseline: 40  Goal status: INITIAL    LONG TERM GOALS: Target date: 05/22/2022 Pt will score 59 on FOTO in order to demonstrate improved perception of functional status due to symptoms.  Baseline: 40 Goal status: INITIAL  2.  Pt will demonstrate symmetrical lumbar rotation AROM in order to demonstrate improved tolerance to functional movement patterns.  Baseline: see ROM chart above  Goal status: INITIAL  3.  Pt will demonstrate grossly symmetrical GH ER MMT in order to facilitate improved shoulder strength for functional reaching.   Baseline: see MMT chart above  Goal status: INITIAL  4. Pt will demonstrate ability to lift up to 10# with less than 2pt increase in resting pain on NPS order to indicate improved tolerance to heavy activities around the home.   Baseline: pt avoiding lifting due to pain  Goal status: INITIAL   5. Pt will endorse at least 50% improvement in positioning for sleep in order to facilitate improved overall health/QOL.   Baseline: pt voices difficulty lying down to sleep  Goal status: INITIAL   6. Pt will demonstrate appropriate performance of final prescribed HEP in order to facilitate improved self-management of symptoms post-discharge.   Baseline: initial HEP prescribed  Goal status: INITIAL   PLAN:  PT FREQUENCY: 2x/week   PT DURATION: 8  weeks   PLANNED INTERVENTIONS: Therapeutic exercises, Therapeutic activity, Neuromuscular re-education, Balance training, Gait training, Patient/Family education, Self Care, Joint mobilization, Joint manipulation, Stair training, DME instructions, Aquatic Therapy, Dry Needling, Spinal manipulation, Spinal mobilization, Cryotherapy, Moist heat, Taping, Manual therapy, and Re-evaluation.  PLAN FOR NEXT SESSION: Progress ROM/strengthening exercises as able/appropriate, review HEP. Emphasis on cervical/periscapular mobility    Ashley Murrain PT, DPT 03/27/2022 4:23 PM     No visit discharge addendum:   PHYSICAL THERAPY DISCHARGE SUMMARY  Visits from Start of Care: 1  Current functional level related to goals / functional outcomes: unknown   Remaining deficits: Unknown, unable to be assessed   Education / Equipment: Unable to be assessed   Patient unable to agree to discharge due to lack of follow up. . Patient goals were  unable to be assessed . Patient is being discharged due to not returning since the last visit.    Ashley Murrain PT, DPT 06/07/2022 11:29 AM

## 2022-03-27 ENCOUNTER — Other Ambulatory Visit: Payer: Self-pay

## 2022-03-27 ENCOUNTER — Encounter: Payer: Self-pay | Admitting: Physical Therapy

## 2022-03-27 ENCOUNTER — Ambulatory Visit: Payer: BC Managed Care – PPO | Attending: Rheumatology | Admitting: Physical Therapy

## 2022-03-27 DIAGNOSIS — M4125 Other idiopathic scoliosis, thoracolumbar region: Secondary | ICD-10-CM | POA: Diagnosis not present

## 2022-03-27 DIAGNOSIS — M25511 Pain in right shoulder: Secondary | ICD-10-CM

## 2022-03-27 DIAGNOSIS — M6281 Muscle weakness (generalized): Secondary | ICD-10-CM

## 2022-03-27 DIAGNOSIS — M5459 Other low back pain: Secondary | ICD-10-CM | POA: Insufficient documentation

## 2022-03-27 DIAGNOSIS — G8929 Other chronic pain: Secondary | ICD-10-CM | POA: Insufficient documentation

## 2022-03-27 DIAGNOSIS — M546 Pain in thoracic spine: Secondary | ICD-10-CM

## 2022-03-27 DIAGNOSIS — M545 Low back pain, unspecified: Secondary | ICD-10-CM | POA: Insufficient documentation

## 2022-04-09 ENCOUNTER — Inpatient Hospital Stay: Payer: BC Managed Care – PPO | Attending: Hematology and Oncology

## 2022-04-09 VITALS — BP 102/60 | HR 59 | Temp 98.5°F | Resp 16

## 2022-04-09 DIAGNOSIS — Z79899 Other long term (current) drug therapy: Secondary | ICD-10-CM | POA: Diagnosis not present

## 2022-04-09 DIAGNOSIS — Z9079 Acquired absence of other genital organ(s): Secondary | ICD-10-CM | POA: Diagnosis not present

## 2022-04-09 DIAGNOSIS — D509 Iron deficiency anemia, unspecified: Secondary | ICD-10-CM | POA: Diagnosis not present

## 2022-04-09 MED ORDER — SODIUM CHLORIDE 0.9 % IV SOLN: Freq: Once | INTRAVENOUS | Status: AC

## 2022-04-09 MED ORDER — SODIUM CHLORIDE 0.9 % IV SOLN
400.0000 mg | Freq: Once | INTRAVENOUS | Status: AC
  Administered 2022-04-09: 400 mg via INTRAVENOUS
  Filled 2022-04-09: qty 20

## 2022-04-09 NOTE — Patient Instructions (Signed)

## 2022-04-09 NOTE — Progress Notes (Signed)
Patient tolerated her iron well- no reaction. VSS- BP 102/60 (BP Location: Right Arm, Patient Position: Sitting)   Pulse (!) 59   Temp 98.5 F (36.9 C) (Oral)   Resp 16   SpO2 100%  Observed for 30 minutes. Iv removed with some blood return- patient had burning higher in her arm when treatment started- not at IV site and IV was assessed with no issues. Warm pack helped.

## 2022-04-11 ENCOUNTER — Ambulatory Visit: Payer: BC Managed Care – PPO | Admitting: Physical Therapy

## 2022-04-16 ENCOUNTER — Other Ambulatory Visit: Payer: Self-pay

## 2022-04-16 ENCOUNTER — Inpatient Hospital Stay: Payer: BC Managed Care – PPO

## 2022-04-16 ENCOUNTER — Encounter: Payer: Self-pay | Admitting: Hematology and Oncology

## 2022-04-16 VITALS — BP 110/76 | HR 71 | Temp 98.0°F | Resp 16

## 2022-04-16 DIAGNOSIS — D509 Iron deficiency anemia, unspecified: Secondary | ICD-10-CM

## 2022-04-16 DIAGNOSIS — Z79899 Other long term (current) drug therapy: Secondary | ICD-10-CM | POA: Diagnosis not present

## 2022-04-16 DIAGNOSIS — Z9079 Acquired absence of other genital organ(s): Secondary | ICD-10-CM | POA: Diagnosis not present

## 2022-04-16 MED ORDER — SODIUM CHLORIDE 0.9 % IV SOLN
400.0000 mg | Freq: Once | INTRAVENOUS | Status: AC
  Administered 2022-04-16: 400 mg via INTRAVENOUS
  Filled 2022-04-16: qty 20

## 2022-04-16 MED ORDER — SODIUM CHLORIDE 0.9 % IV SOLN: Freq: Once | INTRAVENOUS | Status: AC

## 2022-04-16 NOTE — Patient Instructions (Signed)

## 2022-04-17 ENCOUNTER — Telehealth: Payer: Self-pay

## 2022-04-17 NOTE — Telephone Encounter (Signed)
Called and left a message to call the office back regarding mychart message about IV iron yesterday.

## 2022-04-17 NOTE — Telephone Encounter (Signed)
Called and left a message asking her to call the office back regarding mychart message.

## 2022-04-17 NOTE — Telephone Encounter (Signed)
She called back to follow up on mychart message. Yesterday after IV iron infusion on the way to the car, her left hand and feet became swollen. Her IV yesterday was in her left arm. She did take Benadryl last pm and this morning all the swelling has resolved. She declined appt with [facility]. Next infusion appt is 3/18.  Just FYI

## 2022-04-18 ENCOUNTER — Ambulatory Visit: Payer: BC Managed Care – PPO | Admitting: Physical Therapy

## 2022-04-20 DIAGNOSIS — Z113 Encounter for screening for infections with a predominantly sexual mode of transmission: Secondary | ICD-10-CM | POA: Diagnosis not present

## 2022-04-20 DIAGNOSIS — Z01419 Encounter for gynecological examination (general) (routine) without abnormal findings: Secondary | ICD-10-CM | POA: Diagnosis not present

## 2022-04-20 DIAGNOSIS — Z6823 Body mass index (BMI) 23.0-23.9, adult: Secondary | ICD-10-CM | POA: Diagnosis not present

## 2022-04-20 DIAGNOSIS — N760 Acute vaginitis: Secondary | ICD-10-CM | POA: Diagnosis not present

## 2022-04-20 DIAGNOSIS — Z124 Encounter for screening for malignant neoplasm of cervix: Secondary | ICD-10-CM | POA: Diagnosis not present

## 2022-04-23 ENCOUNTER — Other Ambulatory Visit: Payer: Self-pay

## 2022-04-23 ENCOUNTER — Emergency Department (HOSPITAL_BASED_OUTPATIENT_CLINIC_OR_DEPARTMENT_OTHER): Payer: BC Managed Care – PPO

## 2022-04-23 ENCOUNTER — Encounter (HOSPITAL_BASED_OUTPATIENT_CLINIC_OR_DEPARTMENT_OTHER): Payer: Self-pay

## 2022-04-23 ENCOUNTER — Emergency Department (HOSPITAL_BASED_OUTPATIENT_CLINIC_OR_DEPARTMENT_OTHER)
Admission: EM | Admit: 2022-04-23 | Discharge: 2022-04-24 | Disposition: A | Discharge status: Home / Self Care | Payer: BC Managed Care – PPO | Attending: Emergency Medicine | Admitting: Emergency Medicine

## 2022-04-23 ENCOUNTER — Inpatient Hospital Stay: Payer: BC Managed Care – PPO

## 2022-04-23 ENCOUNTER — Inpatient Hospital Stay (HOSPITAL_BASED_OUTPATIENT_CLINIC_OR_DEPARTMENT_OTHER): Payer: BC Managed Care – PPO | Admitting: Physician Assistant

## 2022-04-23 VITALS — BP 121/85 | HR 75 | Temp 98.7°F | Resp 18

## 2022-04-23 DIAGNOSIS — D509 Iron deficiency anemia, unspecified: Secondary | ICD-10-CM | POA: Diagnosis not present

## 2022-04-23 DIAGNOSIS — I808 Phlebitis and thrombophlebitis of other sites: Secondary | ICD-10-CM | POA: Diagnosis not present

## 2022-04-23 DIAGNOSIS — I82611 Acute embolism and thrombosis of superficial veins of right upper extremity: Secondary | ICD-10-CM | POA: Diagnosis not present

## 2022-04-23 DIAGNOSIS — I809 Phlebitis and thrombophlebitis of unspecified site: Secondary | ICD-10-CM | POA: Diagnosis not present

## 2022-04-23 DIAGNOSIS — M7989 Other specified soft tissue disorders: Secondary | ICD-10-CM | POA: Diagnosis not present

## 2022-04-23 DIAGNOSIS — I8000 Phlebitis and thrombophlebitis of superficial vessels of unspecified lower extremity: Secondary | ICD-10-CM | POA: Diagnosis not present

## 2022-04-23 DIAGNOSIS — R202 Paresthesia of skin: Secondary | ICD-10-CM

## 2022-04-23 LAB — PREGNANCY, URINE: Preg Test, Ur: NEGATIVE

## 2022-04-23 LAB — BASIC METABOLIC PANEL
Anion gap: 4 — ABNORMAL LOW (ref 5–15)
BUN: 9 mg/dL (ref 6–20)
CO2: 20 mmol/L — ABNORMAL LOW (ref 22–32)
Calcium: 8.2 mg/dL — ABNORMAL LOW (ref 8.9–10.3)
Chloride: 109 mmol/L (ref 98–111)
Creatinine, Ser: 0.8 mg/dL (ref 0.44–1.00)
GFR, Estimated: >60 mL/min (ref >60)
Glucose, Bld: 100 mg/dL — ABNORMAL HIGH (ref 70–99)
Potassium: 3.7 mmol/L (ref 3.5–5.1)
Sodium: 133 mmol/L — ABNORMAL LOW (ref 135–145)

## 2022-04-23 LAB — CBC
HCT: 33.4 % — ABNORMAL LOW (ref 36.0–46.0)
Hemoglobin: 11 g/dL — ABNORMAL LOW (ref 12.0–15.0)
MCH: 25.6 pg — ABNORMAL LOW (ref 26.0–34.0)
MCHC: 32.9 g/dL (ref 30.0–36.0)
MCV: 77.9 fL — ABNORMAL LOW (ref 80.0–100.0)
Platelets: 243 10*3/uL (ref 150–400)
RBC: 4.29 MIL/uL (ref 3.87–5.11)
RDW: 21.5 % — ABNORMAL HIGH (ref 11.5–15.5)
WBC: 5.3 10*3/uL (ref 4.0–10.5)
nRBC: 0 % (ref 0.0–0.2)

## 2022-04-23 LAB — TROPONIN I (HIGH SENSITIVITY): Troponin I (High Sensitivity): <2 ng/L (ref <18)

## 2022-04-23 MED ORDER — DIPHENHYDRAMINE HCL 25 MG PO CAPS
50.0000 mg | ORAL_CAPSULE | Freq: Once | ORAL | Status: AC
  Administered 2022-04-23: 50 mg via ORAL
  Filled 2022-04-23: qty 2

## 2022-04-23 MED ORDER — SODIUM CHLORIDE 0.9 % IV SOLN: Freq: Once | INTRAVENOUS | Status: AC

## 2022-04-23 MED ORDER — SODIUM CHLORIDE 0.9 % IV SOLN
400.0000 mg | Freq: Once | INTRAVENOUS | Status: AC
  Administered 2022-04-23: 400 mg via INTRAVENOUS
  Filled 2022-04-23: qty 20

## 2022-04-23 NOTE — ED Triage Notes (Signed)
Pt c/o right hand weakness and numbness that started around 13:00. Pt reports she had an iron infusion this morning from 9 am until 12:45, the IV was in right hand after she got home, the numbness started.

## 2022-04-23 NOTE — Patient Instructions (Signed)

## 2022-04-23 NOTE — Progress Notes (Signed)
Hypersensitivity Reaction note  Date of event: 04/23/22 Time of event: 1250 Generic name of drug involved: Iron sucrose (Venofer) Name of provider notified of the hypersensitivity reaction: Milinda Cave, PA Was agent that likely caused hypersensitivity reaction added to Allergies List within EMR? Yes Chain of events including reaction signs/symptoms, treatment administered, and outcome (e.g., drug resumed; drug discontinued; sent to Emergency Department; etc.): Patient reported mild right hand swelling and bilateral foot discomfort at the end of her 30 min post observation period.  VSS, no c/o SOB, dizziness, lightheadedness.  VO received for Benadryl (see MAR).  Per PA, patient OK to discharge if patient comfortable.  Patient ambulatory to lobby for ride home with husband.  Angie Fava, RN 04/23/2022 1:18 PM

## 2022-04-23 NOTE — ED Provider Notes (Signed)
[facility] [place] [facility] Provider Note   CSN: [id number] Arrival date & time: 04/23/22  1803     History  Chief Complaint  Patient presents with   Extremity Weakness    April Rodgers is a 37 y.o. female, history of iron deficiency anemia, who presents to the ED secondary to bilateral lower extremity swelling, and swelling of her right arm, after receiving a iron infusion at around 9 AM today.  She stated that her iron infusion was completed in her right arm, and that she has had swelling and from iron fusions before, but the thing that alarmed her the most is that she had numbness in her right arm.  She has no changes in speech, weakness, or difficulty with vision.  She denies any rash.    Home Medications Prior to Admission medications   Medication Sig Start Date End Date Taking? Authorizing Provider  acetaminophen (TYLENOL) 500 MG tablet Take 1,000 mg by mouth every 6 (six) hours as needed for moderate pain or fever.    [provider]  benzonatate (TESSALON) 100 MG capsule Take 1 capsule (100 mg total) by mouth 3 (three) times daily as needed for cough. 02/28/22   Saguier, Percell Miller, PA-C  famotidine (PEPCID) 20 MG tablet Take 1 tablet (20 mg total) by mouth 2 (two) times daily. 03/08/22   Kennith Gain, MD  fexofenadine (ALLEGRA) 180 MG tablet Take 1 tablet (180 mg total) by mouth in the morning and at bedtime. 03/08/22   Kennith Gain, MD  fluticasone (FLONASE) 50 MCG/ACT nasal spray Place 2 sprays into both nostrils daily. 02/28/22   Saguier, Percell Miller, PA-C  IBUPROFEN PO Take by mouth as needed.    [provider]  metroNIDAZOLE (FLAGYL) 500 MG tablet Take 500 mg by mouth 2 (two) times daily. 04/20/22 04/26/22  [provider]  olopatadine (PATANOL) 0.1 % ophthalmic solution Place 1 drop into both eyes 2 (two) times daily. 11/27/21   Saguier, Percell Miller, PA-C  RYALTRIS [phone] MCG/ACT SUSP 2 sprays each nostril  twice a day as needed for runny or stuffy nose 03/08/22   Kennith Gain, MD  sertraline (ZOLOFT) 50 MG tablet Take 50 mg by mouth daily. 07/29/20   [provider]  Vitamin D, Ergocalciferol, (DRISDOL) 1.25 MG (50000 UNIT) CAPS capsule TAKE 1 CAPSULE (50,000 UNITS TOTAL) BY MOUTH EVERY 7 (SEVEN) DAYS 07/28/21   Shelda Pal, DO      Allergies    Iron sucrose and Penicillins    Review of Systems   Review of Systems  Cardiovascular:  Positive for leg swelling.  Musculoskeletal:  Positive for extremity weakness.    Physical Exam Updated Vital Signs BP 100/68   Pulse 73   Temp 98.2 F (36.8 C) (Oral)   Resp 18   Ht 5\' 9"  (1.753 m)   Wt 72.6 kg   SpO2 100%   BMI 23.63 kg/m  Physical Exam Vitals and nursing note reviewed.  Constitutional:      General: She is not in acute distress.    Appearance: She is well-developed.  HENT:     Head: Normocephalic and atraumatic.  Eyes:     Conjunctiva/sclera: Conjunctivae normal.  Cardiovascular:     Rate and Rhythm: Normal rate and regular rhythm.     Heart sounds: No murmur heard. Pulmonary:     Effort: Pulmonary effort is normal. No respiratory distress.     Breath sounds: Normal breath sounds.  Abdominal:  Palpations: Abdomen is soft.     Tenderness: There is no abdominal tenderness.  Musculoskeletal:        General: No swelling.     Cervical back: Neck supple.     Comments: Mild nonpitting edema of the right arm/hand, and bilateral lower extremities.  Skin:    General: Skin is warm and dry.     Capillary Refill: Capillary refill takes less than 2 seconds.  Neurological:     Mental Status: She is alert.     Cranial Nerves: No cranial nerve deficit.     Sensory: Sensory deficit present.     Coordination: Coordination normal.     Gait: Gait normal.     Comments: Sensory deficit to the right upper extremity, decreased sensation compared to the left.   Psychiatric:        Mood and Affect: Mood  normal.     ED Results / Procedures / Treatments   Labs (all labs ordered are listed, but only abnormal results are displayed) Labs Reviewed  BASIC METABOLIC PANEL - Abnormal; Notable for the following components:      Result Value   Sodium 133 (*)    CO2 20 (*)    Glucose, Bld 100 (*)    Calcium 8.2 (*)    Anion gap 4 (*)    All other components within normal limits  CBC - Abnormal; Notable for the following components:   Hemoglobin 11.0 (*)    HCT 33.4 (*)    MCV 77.9 (*)    MCH 25.6 (*)    RDW 21.5 (*)    All other components within normal limits  PREGNANCY, URINE  TROPONIN I (HIGH SENSITIVITY)  TROPONIN I (HIGH SENSITIVITY)    EKG None  Radiology US Venous Img Upper Right (DVT Study)  Result Date: 04/23/2022 CLINICAL DATA:  Arm swelling EXAM: RIGHT UPPER EXTREMITY VENOUS DOPPLER ULTRASOUND TECHNIQUE: Gray-scale sonography with graded compression, as well as color Doppler and duplex ultrasound were performed to evaluate the upper extremity deep venous system from the level of the subclavian vein and including the jugular, axillary, basilic, radial, ulnar and upper cephalic vein. Spectral Doppler was utilized to evaluate flow at rest and with distal augmentation maneuvers. COMPARISON:  None Available. FINDINGS: Contralateral Subclavian Vein: Respiratory phasicity is normal and symmetric with the symptomatic side. No evidence of thrombus. Normal compressibility. Internal Jugular Vein: No evidence of thrombus. Normal compressibility, respiratory phasicity and response to augmentation. Subclavian Vein: No evidence of thrombus. Normal compressibility, respiratory phasicity and response to augmentation. Axillary Vein: No evidence of thrombus. Normal compressibility, respiratory phasicity and response to augmentation. Cephalic Vein: Thrombus seen within the right cephalic vein in the forearm to the elbow. Basilic Vein: Thrombus seen in the basilic vein at the elbow. Brachial Veins: No  evidence of thrombus. Normal compressibility, respiratory phasicity and response to augmentation. Radial Veins: Not well visualized Ulnar Veins: Not well visualized Venous Reflux:  None visualized. Other Findings:  None visualized. 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 Electronically Signed   By: Rolm Baptise M.D.   On: 04/23/2022 22:36   DG Chest 2 View  Result Date: 04/23/2022 CLINICAL DATA:  Swelling to the right arm/hand following iron transfusion EXAM: CHEST - 2 VIEW COMPARISON:  Chest radiograph dated 03/31/2017 FINDINGS: Normal lung volumes. No focal consolidations. No pleural effusion or pneumothorax. The heart size and mediastinal contours are  within normal limits. Reverse S-shaped curvature of the thoracolumbar spine. IMPRESSION: No active cardiopulmonary disease. Electronically Signed   By: Darrin Nipper M.D.   On: 04/23/2022 19:10    Procedures Procedures    Medications Ordered in ED Medications - No data to display  ED Course/ Medical Decision Making/ A&P                             Medical Decision Making Patient is a 37 year old female, here for swelling of her right arm after receiving IV iron infusion.  She states she typically has swelling of her feet, but does not typically have that bout of swelling of her right arm, and paresthesias.  We will obtain a DVT study to evaluate for possible DVT.  Additionally labs were ordered, and trops by nursing in triage.  She has no chest pain or shortness of breath at this time.  Amount and/or Complexity of Data Reviewed Labs: ordered.    Details: Unremarkable Radiology: ordered.    Details: Called DVT study shows superficial thrombus in basilic and cephalic vein.  CXR clear Discussion of management or test interpretation with external provider(s): Patient is a 37 year old female, here for right hand numbness, right hand swelling,  found to have a  thrombophlebitis, one of the thrombosis was in the short end of the basilic vein, we will have him follow-up with the DVT center, and have a repeat ultrasound to ensure no progression.  Start daily aspirin.  Discussed treatment for superficial thrombophlebitis.  Additionally discussed paresthesias, and follow-up with hematology for this as this may be an adverse reaction from the iron infusion.  Discussed return precautions she voiced understanding.    Final Clinical Impression(s) / ED Diagnoses Final diagnoses:  Superficial thrombophlebitis after intravenous administration of drug  Paresthesias    Rx / DC Orders ED Discharge Orders          Ordered    AMB Referral to [facility]       Comments: Please call [phone] with any questions.   04/23/22 2332              Chipper Koudelka, Si Gaul, PA 04/23/22 2344    Audley Hose, MD 04/25/22 2220

## 2022-04-23 NOTE — ED Notes (Signed)
Pt. Here due to her R hand feeling numb earlier today.  Pt. Reports her hand feels normal now.  Pt. Does have slight edema in the R hand compared to the L and she had an Iron infusion today at [facility]am [facility].  Pt. In no distress.

## 2022-04-23 NOTE — Discharge Instructions (Addendum)
Please follow-up with your primary care doctor, and your hematologist.  Additionally you will need to have a DVT study repeated in about 3 days, to evaluate for possible progression of your superficial thrombus.  You can take a baby aspirin daily until finding the results of your scan.  You can also use heat, and Tylenol and ibuprofen to help with your pain.  If you start having shortness of breath, chest pain, worsening swelling please return to the ER.

## 2022-04-23 NOTE — Progress Notes (Signed)
Symptom Management Consult Note [place]    Patient Care Team: Saguier, Iris Pert as PCP - General (Internal Medicine)    Name / MRN / DOB: April Rodgers  [id number]  [birth year]   Date of visit: 04/23/2022   Chief Complaint/Reason for visit: extremity swelling   Current Therapy: Venofer 400 mg    ASSESSMENT & PLAN: Patient is a 37 y.o. female  with pertinent history of iron deficiency anemia followed by Dr. Alvy Bimler.  I have viewed most recent heme/onc note and lab work.    #Iron deficiency anemia - Extremity swelling after Venofer 400 infusion today as well as after her previous infusion on 04/16/22. - Exam without symptoms of anaphylaxis. Extremities are neurovascularly intact. Patient given 50 mg PO Benadryl. - Dr. Alvy Bimler notified. Hypersensitivity entered by RN. - Next appointment with Dr. Alvy Bimler  is 06/19/22   Strict ED precautions discussed should symptoms worsen.     Heme/Onc History: Oncology History   No history exists.      Interval history-: April Rodgers is a 37 y.o. female with pertinent history of iron deficiency anemia seen in the infusion center today for extremity swelling. Patient is here for 3rd iron infusion of Venofer 400 mg. After the infusion patient was observed for 30 minutes. During observation period she reported swelling in her right hand and bilateral feet. She has an IV in her right forearm. She denies any associated pain, shortness of breath, nausea, vomiting. No pain at IV site. She states similar symptoms happened after her previous infusion on 04/16/22 except that time is was her left hand and bilateral feet and her IV was in left forearm. Symptoms that time started at home and resolved within 24 hours after taking benadryl. She did not have any premedications today.     ROS  All other systems are reviewed and are negative for acute change except as noted in the HPI.    Allergies  Allergen Reactions    Iron Sucrose Swelling and Other (See Comments)    Discomfort to bilateral feet, unilateral swelling of hand.   Penicillins Hives and Rash    Has patient had a PCN reaction causing immediate rash, facial/tongue/throat swelling, SOB or lightheadedness with hypotension: Yes Has patient had a PCN reaction causing severe rash involving mucus membranes or skin necrosis: No Has patient had a PCN reaction that required hospitalization No Has patient had a PCN reaction occurring within the last 10 years: No If all of the above answers are "NO", then may proceed with Cephalosporin use.      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   History of chorioamnionitis    03-10-2015   History of fetal loss    03-10-2015  non-viable fetal demise twins at [redacted]w[redacted]d due to chorioamnionitis infection with PPROM   Hx of varicella    Hydrosalpinx    RIGHT   Migraine    Normal exercise tolerance test results    03-22-2006   Obstruction of fallopian tube    bilateral occlusion   Scoliosis      Past Surgical History:  Procedure Laterality Date   DILATION AND CURETTAGE OF UTERUS  2006  HYSTEROSCOPY WITH D & C N/A 06/28/2015   Procedure: SUCTION DILATATION AND CURETTAGE /HYSTEROSCOPY, ;  Surgeon: Governor Specking, MD;  Location: [facility];  Service: Gynecology;  Laterality: N/A;   LAPAROSCOPY Left 06/28/2015   Procedure: LAPAROSCOPY LYSIS OF ADHESIONS, RIGHT SALPINGECTOMY WITH NOVY CATHERIZATION OF LEFT TUBE,EXCISION OF ENDOMETROSIS;  Surgeon: Governor Specking, MD;  Location: [facility];  Service: Gynecology;  Laterality: Left;   TONSILLECTOMY  2012   WISDOM TOOTH EXTRACTION  2016    Social History   Socioeconomic History   Marital status: Married     Spouse name: Not on file   Number of children: Not on file   Years of education: Not on file   Highest education level: Not on file  Occupational History   Not on file  Tobacco Use   Smoking status: Never    Passive exposure: Never   Smokeless tobacco: Never  Vaping Use   Vaping Use: Never used  Substance and Sexual Activity   Alcohol use: No   Drug use: No   Sexual activity: Yes  Other Topics Concern   Not on file  Social History Narrative   Not on file   Social Determinants of Health   Financial Resource Strain: Not on file  Food Insecurity: Not on file  Transportation Needs: Not on file  Physical Activity: Not on file  Stress: Not on file  Social Connections: Not on file  Intimate Partner Violence: Not on file    Family History  Problem Relation Age of Onset   Anemia Mother    Allergic rhinitis Daughter    Eczema Daughter    Allergies Daughter    Allergic rhinitis Son    Allergies Son    Allergies Son    Pneumonia Son      Current Outpatient Medications:    acetaminophen (TYLENOL) 500 MG tablet, Take 1,000 mg by mouth every 6 (six) hours as needed for moderate pain or fever., Disp: , Rfl:    benzonatate (TESSALON) 100 MG capsule, Take 1 capsule (100 mg total) by mouth 3 (three) times daily as needed for cough., Disp: 30 capsule, Rfl: 0   famotidine (PEPCID) 20 MG tablet, Take 1 tablet (20 mg total) by mouth 2 (two) times daily., Disp: 60 tablet, Rfl: 2   fexofenadine (ALLEGRA) 180 MG tablet, Take 1 tablet (180 mg total) by mouth in the morning and at bedtime., Disp: 60 tablet, Rfl: 2   fluticasone (FLONASE) 50 MCG/ACT nasal spray, Place 2 sprays into both nostrils daily., Disp: 16 g, Rfl: 1   IBUPROFEN PO, Take by mouth as needed., Disp: , Rfl:    metroNIDAZOLE (FLAGYL) 500 MG tablet, Take 500 mg by mouth 2 (two) times daily., Disp: , Rfl:    olopatadine (PATANOL) 0.1 % ophthalmic solution, Place 1 drop into both eyes 2 (two) times daily., Disp: 5 mL, Rfl: 4    RYALTRIS 665-25 MCG/ACT SUSP, 2 sprays each nostril twice a day as needed for runny or stuffy nose, Disp: 29 g, Rfl: 5   sertraline (ZOLOFT) 50 MG tablet, Take 50 mg by mouth daily., Disp: , Rfl:    Vitamin D, Ergocalciferol, (DRISDOL) 1.25 MG (50000 UNIT) CAPS capsule, TAKE 1 CAPSULE (50,000 UNITS TOTAL) BY MOUTH EVERY 7 (SEVEN) DAYS, Disp: 4 capsule, Rfl: 1  PHYSICAL EXAM: ECOG FS:1 - Symptomatic but completely ambulatory    Vitals:   04/23/22 1256  BP: 121/85  Pulse: 75  Resp: 18  Temp: 98.7 F (  37.1 C)  TempSrc: Oral  SpO2: 100%   Physical Exam Vitals and nursing note reviewed.  Constitutional:      Appearance: She is well-developed. She is not ill-appearing or toxic-appearing.  HENT:     Head: Normocephalic.     Comments: Airway intact. No angio edema    Nose: Nose normal.  Eyes:     Conjunctiva/sclera: Conjunctivae normal.  Neck:     Vascular: No JVD.  Cardiovascular:     Rate and Rhythm: Normal rate and regular rhythm.     Pulses: Normal pulses.          Radial pulses are 2+ on the right side and 2+ on the left side.     Heart sounds: Normal heart sounds.  Pulmonary:     Effort: Pulmonary effort is normal.     Breath sounds: Normal breath sounds.  Abdominal:     General: There is no distension.  Musculoskeletal:        General: Normal range of motion.     Cervical back: Normal range of motion.     Comments: Mild swelling noted to right hand. No erythema or swelling or TTP around right forearm IV site.   Skin:    General: Skin is warm and dry.     Capillary Refill: Capillary refill takes less than 2 seconds.     Findings: No rash.  Neurological:     Mental Status: She is oriented to person, place, and time.     Comments: Sensation intact to sharp and dull in all extremities. Strong and equal grip strength bilaterally Ambulatory with normal gait        LABORATORY DATA: I have reviewed the data as listed    Latest Ref Rng & Units 03/26/2022   10:02 AM  03/08/2022    2:37 PM 02/14/2022   11:16 AM  CBC  WBC 4.0 - 10.5 K/uL 3.4  3.7  3.1   Hemoglobin 12.0 - 15.0 g/dL 10.5  10.6  10.3   Hematocrit 36.0 - 46.0 % 31.6  33.4  32.1   Platelets 150 - 400 K/uL 213   278         Latest Ref Rng & Units 03/08/2022    2:37 PM 02/14/2022   11:16 AM 07/04/2021    9:10 AM  CMP  Glucose 70 - 99 mg/dL 85  87  92   BUN 6 - 20 mg/dL 5  8  4    Creatinine 0.57 - 1.00 mg/dL 0.77  0.72  0.79   Sodium 134 - 144 mmol/L 141  139  137   Potassium 3.5 - 5.2 mmol/L 4.8  4.5  4.0   Chloride 96 - 106 mmol/L 106  109  103   CO2 20 - 29 mmol/L 21  25  22    Calcium 8.7 - 10.2 mg/dL 9.2  8.8  9.1   Total Protein 6.0 - 8.5 g/dL 7.2  7.1    6.9  6.7   Total Bilirubin 0.0 - 1.2 mg/dL 0.4  0.2  0.3   Alkaline Phos 44 - 121 IU/L 65   70   AST 0 - 40 IU/L 13  12  16    ALT 0 - 32 IU/L 10  7  11         RADIOGRAPHIC STUDIES (from last 24 hours if applicable) I have personally reviewed the radiological images as listed and agreed with the findings in the report. No results found.  Visit Diagnosis: 1. Iron deficiency anemia, unspecified iron deficiency anemia type      No orders of the defined types were placed in this encounter.   All questions were answered. The patient knows to call the clinic with any problems, questions or concerns. No barriers to learning was detected.  I have spent a total of 20 minutes minutes of face-to-face and non-face-to-face time, preparing to see the patient, obtaining and/or reviewing separately obtained history, performing a medically appropriate examination, counseling and educating the patient, ordering medications, documenting clinical information in the electronic health record, and care coordination (communications with other health care professionals or caregivers).    Thank you for allowing me to participate in the care of this patient.    Barrie Folk, PA-C Department of Hematology/Oncology [facility] Phone: [phone]  [phone]    04/23/2022 2:36 PM

## 2022-04-24 ENCOUNTER — Telehealth: Payer: Self-pay

## 2022-04-24 NOTE — Telephone Encounter (Signed)
-----   Message from Heath Lark, MD sent at 04/24/2022  8:32 AM EDT ----- She had allergic reaction yesterday Can you call and check on her today?

## 2022-04-24 NOTE — Telephone Encounter (Signed)
Called her to see how she is doing. She is tired after going to the ER. She was complaining of right sided numbness. Today she is just having some numbness feeling to her right arm and a little to her right chest area. Her right hand swelling is better and but dhe still has a little swelling around her right thumb area. She is complaining of swelling to both feet. She is supposed to follow up with PCP Dr. Harvie Heck on 3/21. Ask her to call Dr. Harvie Heck to schedule appt. She said that she would schedule appt.

## 2022-04-24 NOTE — Telephone Encounter (Signed)
Called and left a message asking her to call the office back on how she is doing today.

## 2022-04-25 ENCOUNTER — Ambulatory Visit: Payer: BC Managed Care – PPO | Admitting: Physical Therapy

## 2022-04-30 ENCOUNTER — Ambulatory Visit (HOSPITAL_BASED_OUTPATIENT_CLINIC_OR_DEPARTMENT_OTHER)
Admission: RE | Admit: 2022-04-30 | Discharge: 2022-04-30 | Disposition: A | Discharge status: Home / Self Care | Payer: BC Managed Care – PPO | Source: Ambulatory Visit | Attending: Vascular Surgery | Admitting: Vascular Surgery

## 2022-04-30 ENCOUNTER — Encounter (HOSPITAL_COMMUNITY): Payer: Self-pay

## 2022-04-30 ENCOUNTER — Ambulatory Visit (HOSPITAL_COMMUNITY)
Admission: RE | Admit: 2022-04-30 | Discharge: 2022-04-30 | Disposition: A | Discharge status: Home / Self Care | Payer: BC Managed Care – PPO | Source: Ambulatory Visit | Attending: Vascular Surgery | Admitting: Vascular Surgery

## 2022-04-30 VITALS — BP 110/72 | HR 74

## 2022-04-30 DIAGNOSIS — I82890 Acute embolism and thrombosis of other specified veins: Secondary | ICD-10-CM | POA: Diagnosis not present

## 2022-04-30 NOTE — Patient Instructions (Signed)
-  Continue aspirin 81 mg daily. We are rechecking your ultrasound today to see if your clot has moved into a deep vein. We will call you with these results and if any changes to your medications are needed.  -Go to the emergency room if emergent signs and symptoms of new clot occur (new or worse swelling and pain in an arm or leg, shortness of breath, chest pain, fast or irregular heartbeats, lightheadedness, dizziness, fainting, coughing up blood) or if you experience a significant color change (pale or blue) in the extremity that has the DVT.   Depending on the results of the ultrasound, we will plan follow up.  [facility] [place] [address], [place], [place] [address] Enter the hospital through [address]trance [address] off [address] and pull up to the [place] entrance to the free [place] parking.  Check in for your appointment at the [facility].   If you have any questions or need to reschedule an appointment, please call [phone] [facility].  If you are having an emergency, call 911 or present to the nearest emergency room.

## 2022-04-30 NOTE — Progress Notes (Cosign Needed)
DVT Clinic Note  Name: April Rodgers     MRN: [id number]     DOB: 1985/02/14     Sex: female  PCP: Elise Benne  Today's Visit: Visit Information: Initial Visit  Referred to DVT Clinic by: Fatima Sanger, PA (ED)  Referred to CPP by: Dr. Scot Dock Reason for referral:  Chief Complaint  Patient presents with   Superficial Vein Thrombosis   HISTORY OF PRESENT ILLNESS:  April Rodgers is a 37 y.o. female with PMH iron deficiency anemia who presents after diagnosis of superficial vein thrombosis for medication management. Patient had an IV iron infusion on 04/23/22 (IV was in R forearm). She started having swelling in her R arm and both legs. Has had swelling after iron infusions before but this typically resolves with Benadryl. She reports it felt different this time in that she also experienced numbness in her R arm and hand and pain in her upper R arm. She presented to the ED 04/23/22 and was found to have superficial vein thrombosis in her R basilic and cephalic veins. She was started on aspirin and referred for repeat US. Today she reports her swelling has improved to baseline and numbness has resolved, but she is still having pain and tenderness in her hand and upper R arm. She has had some dizziness and lightheadedness over the past week which she says is typical for her after an iron infusion. No CP, SOB.   Positive Thrombotic Risk Factors:  (IV placed for iron infusion 04/23/22) Bleeding Risk Factors: Antiplatelet therapy, Anemia  Negative Thrombotic Risk Factors: Previous VTE, Recent surgery (within 3 months), Recent trauma (within 3 months), Recent admission to hospital with acute illness (within 3 months), Paralysis, paresis, or recent plaster cast immobilization of lower extremity, Bed rest >72 hours within 3 months, Sedentary journey lasting >8 hours within 4 weeks, Central venous catheterization, Pregnancy, Testosterone therapy, Estrogen therapy, Active cancer, Smoking,  Obesity, Older age, Recent cesarean section (within 3 months), Recent COVID diagnosis (within 3 months), Known thrombophilic condition, Non-malignant, chronic inflammatory condition, Erythropoiesis-stimulating agent, Within 6 weeks postpartum  Rx Insurance Coverage: Commercial Rx Affordability: Taking aspirin over the counter. 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   History of chorioamnionitis    03-10-2015   History of fetal loss    03-10-2015  non-viable fetal demise twins at [redacted]w[redacted]d due to chorioamnionitis infection with PPROM   Hx of varicella    Hydrosalpinx    RIGHT   Migraine    Normal exercise tolerance test results    03-22-2006   Obstruction of fallopian tube    bilateral occlusion   Scoliosis     Past Surgical History:  Procedure Laterality Date   DILATION AND CURETTAGE OF UTERUS  2006   HYSTEROSCOPY WITH D & C N/A 06/28/2015   Procedure: SUCTION DILATATION AND CURETTAGE [place], ;  Surgeon: Governor Specking, MD;  Location: [facility];  Service: Gynecology;  Laterality: N/A;   LAPAROSCOPY Left 06/28/2015   Procedure: LAPAROSCOPY LYSIS OF ADHESIONS, RIGHT SALPINGECTOMY WITH NOVY CATHERIZATION OF LEFT TUBE,EXCISION OF ENDOMETROSIS;  Surgeon: Governor Specking, MD;  Location: [facility];  Service: Gynecology;  Laterality: Left;   TONSILLECTOMY  2012   WISDOM TOOTH EXTRACTION  2016    Social History   Socioeconomic History   Marital status: Married    Spouse name: Not on file   Number of children: Not on file   Years of education: Not on file   Highest education level: Not on file  Occupational History   Not on file  Tobacco Use   Smoking status: Never     Passive exposure: Never   Smokeless tobacco: Never  Vaping Use   Vaping Use: Never used  Substance and Sexual Activity   Alcohol use: No   Drug use: No   Sexual activity: Yes  Other Topics Concern   Not on file  Social History Narrative   Not on file   Social Determinants of Health   Financial Resource Strain: Not on file  Food Insecurity: Not on file  Transportation Needs: Not on file  Physical Activity: Not on file  Stress: Not on file  Social Connections: Not on file  Intimate Partner Violence: Not on file    Family History  Problem Relation Age of Onset   Anemia Mother    Allergic rhinitis Daughter    Eczema Daughter    Allergies Daughter    Allergic rhinitis Son    Allergies Son    Allergies Son    Pneumonia Son     Allergies as of 04/30/2022 - Review Complete 04/30/2022  Allergen Reaction Noted   Iron sucrose Swelling and Other (See Comments) 04/23/2022   Penicillins Hives and Rash 12/27/2010    Current Outpatient Medications on File Prior to Encounter  Medication Sig Dispense Refill   acetaminophen (TYLENOL) 500 MG tablet Take 1,000 mg by mouth every 6 (six) hours as needed for moderate pain or fever.     aspirin EC 81 MG tablet Take 81 mg by mouth daily. Swallow whole.     fexofenadine (ALLEGRA) 180 MG tablet Take 1 tablet (180 mg total) by mouth in the morning and at bedtime. 60 tablet 2   fluticasone (FLONASE) 50 MCG/ACT nasal spray Place 2 sprays into both nostrils daily. 16 g 1   ibuprofen (ADVIL) 200 MG tablet Take 400 mg by mouth every 8 (eight) hours as needed for headache, mild pain or moderate pain.     olopatadine (PATANOL) 0.1 % ophthalmic solution Place 1 drop into both eyes 2 (two) times daily. 5 mL 4   RYALTRIS 665-25 MCG/ACT SUSP 2 sprays each nostril twice a day as needed for runny or stuffy nose 29 g 5   sertraline (ZOLOFT) 50 MG tablet Take 50 mg by mouth daily.     Vitamin D, Ergocalciferol, (DRISDOL) 1.25 MG (50000 UNIT) CAPS capsule TAKE 1  CAPSULE (50,000 UNITS TOTAL) BY MOUTH EVERY 7 (SEVEN) DAYS 4 capsule 1   No current facility-administered medications on file prior to encounter.   REVIEW OF SYSTEMS:  Review of Systems  Respiratory:  Negative for shortness of breath.   Cardiovascular:  Negative for chest pain and palpitations.  Musculoskeletal:  Positive for myalgias.  Neurological:  Positive for dizziness. Negative for tingling.   PHYSICAL EXAMINATION:  Vitals:   04/30/22 1409  BP: 110/72  Pulse: 74  SpO2: 100%   Physical Exam Vitals reviewed.  Cardiovascular:     Rate and Rhythm: Normal rate.  Pulmonary:     Effort: Pulmonary effort is normal.  Musculoskeletal:        General: Tenderness present. No  swelling.   LABS:  CBC     Component Value Date/Time   WBC 5.3 04/23/2022 1816   RBC 4.29 04/23/2022 1816   HGB 11.0 (L) 04/23/2022 1816   HGB 10.5 (L) 03/26/2022 1002   HGB 10.6 (L) 03/08/2022 1437   HCT 33.4 (L) 04/23/2022 1816   HCT 33.4 (L) 03/08/2022 1437   PLT 243 04/23/2022 1816   PLT 213 03/26/2022 1002   PLT 316 07/04/2021 0910   MCV 77.9 (L) 04/23/2022 1816   MCV 79 03/08/2022 1437   MCH 25.6 (L) 04/23/2022 1816   MCHC 32.9 04/23/2022 1816   RDW 21.5 (H) 04/23/2022 1816   RDW 18.9 (H) 03/08/2022 1437   LYMPHSABS 1.8 03/26/2022 1002   LYMPHSABS 2.0 03/08/2022 1437   MONOABS 0.4 03/26/2022 1002   EOSABS 0.0 03/26/2022 1002   EOSABS 0.0 03/08/2022 1437   BASOSABS 0.0 03/26/2022 1002   BASOSABS 0.0 03/08/2022 1437    Hepatic Function      Component Value Date/Time   PROT 7.2 03/08/2022 1437   ALBUMIN 4.2 03/08/2022 1437   AST 13 03/08/2022 1437   ALT 10 03/08/2022 1437   ALKPHOS 65 03/08/2022 1437   BILITOT 0.4 03/08/2022 1437    Renal Function   Lab Results  Component Value Date   CREATININE 0.80 04/23/2022   CREATININE 0.77 03/08/2022   CREATININE 0.72 02/14/2022   Estimated Creatinine Clearance: 102.6 mL/min (by C-G formula based on SCr of 0.8 mg/dL).   [facility] Lab  Studies:  04/23/22 doppler (right arm) IMPRESSION: Venous thrombosis within a short segment of basilic vein at the elbow and within the cephalic vein from the elbow and into the forearm.  ASSESSMENT: Location of thrombosis: Right superficial vein Cause of thrombosis: provoked by a transient risk factor (IV placement).   Current plan for superficial vein thrombosis is for 3 months of aspirin. Needs repeat US to rule out clot propagation. Appt scheduled for 3:30pm today at [facility].   PLAN: -Continue aspirin 81 mg daily.  -Continue warm compresses and ibuprofen as needed.  -Repeat UE doppler. Will call patient with any medication changes needed based on these results.  -Patient educated on purpose, proper use and potential adverse effects of aspirin. -Educated patient to present to the ED if emergent signs and symptoms of new thrombosis occur.  Follow up: Follow up plan pending results of today's [place].   Rebbeca Paul, PharmD, [place], CPP [facility] Clinical Pharmacist Practitioner Office: [phone]  I have evaluated the patient's chart/imaging and refer this patient to the Clinical Pharmacist Practitioner for medication management. I have reviewed the CPP's documentation and agree with her assessment and plan. I was immediately available during the visit for questions and collaboration.   Deitra Mayo, MD

## 2022-05-01 ENCOUNTER — Telehealth (HOSPITAL_COMMUNITY): Payer: Self-pay | Admitting: Student-PharmD

## 2022-05-01 NOTE — Addendum Note (Signed)
Encounter addended by: Angelia Mould, MD on: 05/01/2022 6:31 AM  Actions taken: Clinical Note Signed

## 2022-05-01 NOTE — Telephone Encounter (Signed)
Called patient to review results of doppler from yesterday. It shows no clot propagation, so we will continue the current plan of aspirin 81 mg daily x 3 months (started on 04/23/22). She has no questions at this time. Encouraged her to call back if any questions come up. Otherwise, she will follow up with her PCP next and with the DVT Clinic as needed.   Rebbeca Paul, PharmD, [place], CPP [facility] Clinical Pharmacist Practitioner Office: [phone]

## 2022-05-02 ENCOUNTER — Ambulatory Visit: Payer: BC Managed Care – PPO | Admitting: Physical Therapy

## 2022-05-07 ENCOUNTER — Ambulatory Visit: Payer: BC Managed Care – PPO | Admitting: Physical Therapy

## 2022-05-09 ENCOUNTER — Ambulatory Visit: Payer: BC Managed Care – PPO | Admitting: Physical Therapy

## 2022-05-10 ENCOUNTER — Telehealth: Payer: Self-pay

## 2022-05-10 NOTE — Telephone Encounter (Signed)
Attempted to notify Patient of completion of FMLA forms. Unable to reach Patient by phone. Forms emailed to Patient as requested. Fax transmission confirmation received from [facility].

## 2022-05-14 ENCOUNTER — Encounter: Payer: BC Managed Care – PPO | Admitting: Physical Therapy

## 2022-05-16 ENCOUNTER — Telehealth: Payer: Self-pay

## 2022-05-16 ENCOUNTER — Encounter: Payer: BC Managed Care – PPO | Admitting: Physical Therapy

## 2022-05-16 NOTE — Telephone Encounter (Signed)
Attempted to return call to [employer] at [phone] regarding changes made to [facility] form regarding duration of condition. Unable to reach representative or leave a message regarding information requested.

## 2022-05-21 ENCOUNTER — Encounter: Payer: BC Managed Care – PPO | Admitting: Physical Therapy

## 2022-05-22 ENCOUNTER — Encounter: Payer: Self-pay | Admitting: Allergy

## 2022-05-22 ENCOUNTER — Telehealth: Payer: BC Managed Care – PPO | Admitting: Family

## 2022-05-22 DIAGNOSIS — K047 Periapical abscess without sinus: Secondary | ICD-10-CM

## 2022-05-22 DIAGNOSIS — K0889 Other specified disorders of teeth and supporting structures: Secondary | ICD-10-CM | POA: Diagnosis not present

## 2022-05-22 MED ORDER — CLINDAMYCIN HCL 300 MG PO CAPS: 300.0000 mg | ORAL_CAPSULE | Freq: Three times a day (TID) | ORAL | 0 refills | Status: DC

## 2022-05-22 MED ORDER — IBUPROFEN 600 MG PO TABS: 600.0000 mg | ORAL_TABLET | Freq: Three times a day (TID) | ORAL | 0 refills | Status: DC | PRN

## 2022-05-22 NOTE — Progress Notes (Signed)
Virtual Visit Consent   [name], you are scheduled for a virtual visit with a [facility] provider today. Just as with appointments in the office, your consent must be obtained to participate. Your consent will be active for this visit and any virtual visit you may have with one of our providers in the next 365 days. If you have a MyChart account, a copy of this consent can be sent to you electronically.  As this is a virtual visit, video technology does not allow for your provider to perform a traditional examination. This may limit your provider's ability to fully assess your condition. If your provider identifies any concerns that need to be evaluated in person or the need to arrange testing (such as labs, EKG, etc.), we will make arrangements to do so. Although advances in technology are sophisticated, we cannot ensure that it will always work on either your end or our end. If the connection with a video visit is poor, the visit may have to be switched to a telephone visit. With either a video or telephone visit, we are not always able to ensure that we have a secure connection.  By engaging in this virtual visit, you consent to the provision of healthcare and authorize for your insurance to be billed (if applicable) for the services provided during this visit. Depending on your insurance coverage, you may receive a charge related to this service.  I need to obtain your verbal consent now. Are you willing to proceed with your visit today? KORINE WINTON has provided verbal consent on 05/22/2022 for a virtual visit (video or telephone). Jannifer Rodney, FNP  Date: 05/22/2022 6:49 PM  Virtual Visit via Video Note   I, Jannifer Rodney, connected with  April Rodgers  ([id number], 1985-03-30) on 05/22/22 at  6:45 PM EDT by a video-enabled telemedicine application and verified that I am speaking with the correct person using two identifiers.  Location: Patient: Virtual Visit Location  Patient: Home Provider: Virtual Visit Location Provider: Home Office   I discussed the limitations of evaluation and management by telemedicine and the availability of in person appointments. The patient expressed understanding and agreed to proceed.    History of Present Illness: April Rodgers is a 37 y.o. who identifies as a female who was assigned female at birth, and is being seen today for dental pain that started last week after a cracked tooth. She has an appointment with her dentist 06/04/22. She reports her pain has worsen and having left upper swelling and tenderness. Reports constant throbbing 8 out 10 pain. Denies any fever or discharge and is able to talk and eat.   HPI: HPI  Problems:  Patient Active Problem List   Diagnosis Date Noted   Superficial vein thrombosis 04/30/2022   Iron deficiency anemia 03/26/2022   Pregnancy 07/05/2017   Term pregnancy 05/17/2016   Chorioamnionitis in second trimester 03/10/2015   Twin pregnancy, antepartum 03/10/2015   Vaginal delivery 03/10/2015   PROM (premature rupture of membranes) 03/04/2015    Allergies:  Allergies  Allergen Reactions   Iron Sucrose Swelling and Other (See Comments)    Discomfort to bilateral feet, unilateral swelling of hand.   Penicillins Hives and Rash    Has patient had a PCN reaction causing immediate rash, facial/tongue/throat swelling, SOB or lightheadedness with hypotension: Yes Has patient had a PCN reaction causing severe rash involving mucus membranes or skin necrosis: No Has patient had a PCN reaction that required hospitalization No  Has patient had a PCN reaction occurring within the last 10 years: No If all of the above answers are "NO", then may proceed with Cephalosporin use.    Medications:  Current Outpatient Medications:    clindamycin (CLEOCIN) 300 MG capsule, Take 1 capsule (300 mg total) by mouth 3 (three) times daily., Disp: 30 capsule, Rfl: 0   ibuprofen (ADVIL) 600 MG tablet, Take 1  tablet (600 mg total) by mouth every 8 (eight) hours as needed., Disp: 30 tablet, Rfl: 0   acetaminophen (TYLENOL) 500 MG tablet, Take 1,000 mg by mouth every 6 (six) hours as needed for moderate pain or fever., Disp: , Rfl:    aspirin EC 81 MG tablet, Take 81 mg by mouth daily. Swallow whole., Disp: , Rfl:    fluticasone (FLONASE) 50 MCG/ACT nasal spray, Place 2 sprays into both nostrils daily., Disp: 16 g, Rfl: 1   olopatadine (PATANOL) 0.1 % ophthalmic solution, Place 1 drop into both eyes 2 (two) times daily., Disp: 5 mL, Rfl: 4   RYALTRIS 665-25 MCG/ACT SUSP, 2 sprays each nostril twice a day as needed for runny or stuffy nose, Disp: 29 g, Rfl: 5   sertraline (ZOLOFT) 50 MG tablet, Take 50 mg by mouth daily., Disp: , Rfl:    Vitamin D, Ergocalciferol, (DRISDOL) 1.25 MG (50000 UNIT) CAPS capsule, TAKE 1 CAPSULE (50,000 UNITS TOTAL) BY MOUTH EVERY 7 (SEVEN) DAYS, Disp: 4 capsule, Rfl: 1  Observations/Objective: Patient is well-developed, well-nourished in no acute distress.  Resting comfortably  at home.  Head is normocephalic, atraumatic.  No labored breathing.  Speech is clear and coherent with logical content.  Patient is alert and oriented at baseline.  Mild swelling of left upper jaw  Assessment and Plan: 1. Pain, dental - clindamycin (CLEOCIN) 300 MG capsule; Take 1 capsule (300 mg total) by mouth 3 (three) times daily.  Dispense: 30 capsule; Refill: 0 - ibuprofen (ADVIL) 600 MG tablet; Take 1 tablet (600 mg total) by mouth every 8 (eight) hours as needed.  Dispense: 30 tablet; Refill: 0  2. Dental infection - clindamycin (CLEOCIN) 300 MG capsule; Take 1 capsule (300 mg total) by mouth 3 (three) times daily.  Dispense: 30 capsule; Refill: 0 - ibuprofen (ADVIL) 600 MG tablet; Take 1 tablet (600 mg total) by mouth every 8 (eight) hours as needed.  Dispense: 30 tablet; Refill: 0  Rinse mouth after eating No other NSAID's while taking ibuprofen  Tylenol as needed  Keep dentist  appointment   Follow Up Instructions: I discussed the assessment and treatment plan with the patient. The patient was provided an opportunity to ask questions and all were answered. The patient agreed with the plan and demonstrated an understanding of the instructions.  A copy of instructions were sent to the patient via MyChart unless otherwise noted below.     The patient was advised to call back or seek an in-person evaluation if the symptoms worsen or if the condition fails to improve as anticipated.  Time:  I spent 6 minutes with the patient via telehealth technology discussing the above problems/concerns.    Jannifer Rodney, FNP

## 2022-06-05 ENCOUNTER — Encounter: Payer: Self-pay | Admitting: Hematology and Oncology

## 2022-06-12 ENCOUNTER — Encounter: Payer: Self-pay | Admitting: Medical

## 2022-06-13 MED ORDER — FLUCONAZOLE 150 MG PO TABS: 150.0000 mg | ORAL_TABLET | Freq: Every day | ORAL | 0 refills | Status: DC

## 2022-06-13 NOTE — Addendum Note (Signed)
Addended by: Gwenevere Abbot on: 06/13/2022 05:34 AM   Modules accepted: Orders

## 2022-06-18 ENCOUNTER — Telehealth: Payer: Self-pay | Admitting: Hematology and Oncology

## 2022-06-19 ENCOUNTER — Inpatient Hospital Stay: Payer: BC Managed Care – PPO | Admitting: Hematology and Oncology

## 2022-06-19 ENCOUNTER — Inpatient Hospital Stay: Payer: BC Managed Care – PPO

## 2022-07-03 ENCOUNTER — Inpatient Hospital Stay: Payer: BC Managed Care – PPO | Admitting: Hematology and Oncology

## 2022-07-03 ENCOUNTER — Inpatient Hospital Stay: Payer: BC Managed Care – PPO | Attending: Hematology and Oncology

## 2022-07-03 ENCOUNTER — Encounter: Payer: Self-pay | Admitting: Hematology and Oncology

## 2022-08-22 ENCOUNTER — Encounter: Payer: Self-pay | Admitting: Medical

## 2022-10-27 ENCOUNTER — Telehealth: Payer: BC Managed Care – PPO

## 2022-10-27 DIAGNOSIS — K0889 Other specified disorders of teeth and supporting structures: Secondary | ICD-10-CM

## 2022-10-27 DIAGNOSIS — K047 Periapical abscess without sinus: Secondary | ICD-10-CM | POA: Diagnosis not present

## 2022-10-27 MED ORDER — IBUPROFEN 600 MG PO TABS: 600.0000 mg | ORAL_TABLET | Freq: Three times a day (TID) | ORAL | 0 refills | Status: AC | PRN

## 2022-10-27 MED ORDER — CLINDAMYCIN HCL 300 MG PO CAPS: 300.0000 mg | ORAL_CAPSULE | Freq: Three times a day (TID) | ORAL | 0 refills | Status: DC

## 2022-10-27 NOTE — Progress Notes (Signed)
Virtual Visit Consent   April Rodgers, you are scheduled for a virtual visit with a [facility] provider today. Just as with appointments in the office, your consent must be obtained to participate. Your consent will be active for this visit and any virtual visit you may have with one of our providers in the next 365 days. If you have a MyChart account, a copy of this consent can be sent to you electronically.  As this is a virtual visit, video technology does not allow for your provider to perform a traditional examination. This may limit your provider's ability to fully assess your condition. If your provider identifies any concerns that need to be evaluated in person or the need to arrange testing (such as labs, EKG, etc.), we will make arrangements to do so. Although advances in technology are sophisticated, we cannot ensure that it will always work on either your end or our end. If the connection with a video visit is poor, the visit may have to be switched to a telephone visit. With either a video or telephone visit, we are not always able to ensure that we have a secure connection.  By engaging in this virtual visit, you consent to the provision of healthcare and authorize for your insurance to be billed (if applicable) for the services provided during this visit. Depending on your insurance coverage, you may receive a charge related to this service.  I need to obtain your verbal consent now. Are you willing to proceed with your visit today? April Rodgers has provided verbal consent on 10/27/2022 for a virtual visit (video or telephone). April Rigg, NP  Date: 10/27/2022 8:32 AM  Virtual Visit via Video Note   I, April Rodgers, connected with  April Rodgers  ([id number], 22-Jun-1985) on 10/27/22 at  8:30 AM EDT by a video-enabled telemedicine application and verified that I am speaking with the correct person using two identifiers.  Location: Patient: Virtual Visit Location  Patient: Home Provider: Virtual Visit Location Provider: Home Office   I discussed the limitations of evaluation and management by telemedicine and the availability of in person appointments. The patient expressed understanding and agreed to proceed.    History of Present Illness: April Rodgers is a 37 y.o. who identifies as a female who was assigned female at birth, and is being seen today for dental pain.  She has dental pain and gum swelling in and around the left upper molar. Tooth is also fractured as well. She has reached out for an appointment but the earliest she can be seen by a dentist is next week.   Problems:  Patient Active Problem List   Diagnosis Date Noted   Superficial vein thrombosis 04/30/2022   Iron deficiency anemia 03/26/2022   Pregnancy 07/05/2017   Term pregnancy 05/17/2016   Chorioamnionitis in second trimester 03/10/2015   Twin pregnancy, antepartum 03/10/2015   Vaginal delivery 03/10/2015   PROM (premature rupture of membranes) 03/04/2015    Allergies:  Allergies  Allergen Reactions   Iron Sucrose Swelling and Other (See Comments)    Discomfort to bilateral feet, unilateral swelling of hand.   Penicillins Hives and Rash    Has patient had a PCN reaction causing immediate rash, facial/tongue/throat swelling, SOB or lightheadedness with hypotension: Yes Has patient had a PCN reaction causing severe rash involving mucus membranes or skin necrosis: No Has patient had a PCN reaction that required hospitalization No Has patient had a PCN reaction occurring within  the last 10 years: No If all of the above answers are "NO", then may proceed with Cephalosporin use.    Medications:  Current Outpatient Medications:    acetaminophen (TYLENOL) 500 MG tablet, Take 1,000 mg by mouth every 6 (six) hours as needed for moderate pain or fever., Disp: , Rfl:    aspirin EC 81 MG tablet, Take 81 mg by mouth daily. Swallow whole., Disp: , Rfl:    clindamycin (CLEOCIN)  300 MG capsule, Take 1 capsule (300 mg total) by mouth 3 (three) times daily., Disp: 30 capsule, Rfl: 0   fluconazole (DIFLUCAN) 150 MG tablet, Take 1 tablet (150 mg total) by mouth daily., Disp: 1 tablet, Rfl: 0   fluticasone (FLONASE) 50 MCG/ACT nasal spray, Place 2 sprays into both nostrils daily., Disp: 16 g, Rfl: 1   ibuprofen (ADVIL) 600 MG tablet, Take 1 tablet (600 mg total) by mouth every 8 (eight) hours as needed., Disp: 30 tablet, Rfl: 0   olopatadine (PATANOL) 0.1 % ophthalmic solution, Place 1 drop into both eyes 2 (two) times daily., Disp: 5 mL, Rfl: 4   RYALTRIS 665-25 MCG/ACT SUSP, 2 sprays each nostril twice a day as needed for runny or stuffy nose, Disp: 29 g, Rfl: 5   sertraline (ZOLOFT) 50 MG tablet, Take 50 mg by mouth daily., Disp: , Rfl:    Vitamin D, Ergocalciferol, (DRISDOL) 1.25 MG (50000 UNIT) CAPS capsule, TAKE 1 CAPSULE (50,000 UNITS TOTAL) BY MOUTH EVERY 7 (SEVEN) DAYS, Disp: 4 capsule, Rfl: 1  Observations/Objective: Patient is well-developed, well-nourished in no acute distress.  Resting comfortably at home.  Head is normocephalic, atraumatic.  No labored breathing.  Speech is clear and coherent with logical content.  Patient is alert and oriented at baseline.    Assessment and Plan: 1. Pain, dental - clindamycin (CLEOCIN) 300 MG capsule; Take 1 capsule (300 mg total) by mouth 3 (three) times daily.  Dispense: 30 capsule; Refill: 0 - ibuprofen (ADVIL) 600 MG tablet; Take 1 tablet (600 mg total) by mouth every 8 (eight) hours as needed.  Dispense: 30 tablet; Refill: 0  2. Dental infection - clindamycin (CLEOCIN) 300 MG capsule; Take 1 capsule (300 mg total) by mouth 3 (three) times daily.  Dispense: 30 capsule; Refill: 0 - ibuprofen (ADVIL) 600 MG tablet; Take 1 tablet (600 mg total) by mouth every 8 (eight) hours as needed.  Dispense: 30 tablet; Refill: 0  Patient states the earliest she can be seen is next Thursday for a dental appointment.   Follow Up  Instructions: I discussed the assessment and treatment plan with the patient. The patient was provided an opportunity to ask questions and all were answered. The patient agreed with the plan and demonstrated an understanding of the instructions.  A copy of instructions were sent to the patient via MyChart unless otherwise noted below.    The patient was advised to call back or seek an in-person evaluation if the symptoms worsen or if the condition fails to improve as anticipated.  Time:  I spent 12 minutes with the patient via telehealth technology discussing the above problems/concerns.    April Rigg, NP

## 2022-10-27 NOTE — Patient Instructions (Signed)
April Rodgers, thank you for joining April Rigg, NP for today's virtual visit.  While this provider is not your primary care provider (PCP), if your PCP is located in our provider database this encounter information will be shared with them immediately following your visit.   A [name] account gives you access to today's visit and all your visits, tests, and labs performed at [facility] " click here if you don't have a [name] account or go to [email or web address]  Consent: (Patient) April Rodgers provided verbal consent for this virtual visit at the beginning of the encounter.  Current Medications:  Current Outpatient Medications:    acetaminophen (TYLENOL) 500 MG tablet, Take 1,000 mg by mouth every 6 (six) hours as needed for moderate pain or fever., Disp: , Rfl:    aspirin EC 81 MG tablet, Take 81 mg by mouth daily. Swallow whole., Disp: , Rfl:    clindamycin (CLEOCIN) 300 MG capsule, Take 1 capsule (300 mg total) by mouth 3 (three) times daily., Disp: 30 capsule, Rfl: 0   fluconazole (DIFLUCAN) 150 MG tablet, Take 1 tablet (150 mg total) by mouth daily., Disp: 1 tablet, Rfl: 0   fluticasone (FLONASE) 50 MCG/ACT nasal spray, Place 2 sprays into both nostrils daily., Disp: 16 g, Rfl: 1   ibuprofen (ADVIL) 600 MG tablet, Take 1 tablet (600 mg total) by mouth every 8 (eight) hours as needed., Disp: 30 tablet, Rfl: 0   olopatadine (PATANOL) 0.1 % ophthalmic solution, Place 1 drop into both eyes 2 (two) times daily., Disp: 5 mL, Rfl: 4   RYALTRIS 665-25 MCG/ACT SUSP, 2 sprays each nostril twice a day as needed for runny or stuffy nose, Disp: 29 g, Rfl: 5   sertraline (ZOLOFT) 50 MG tablet, Take 50 mg by mouth daily., Disp: , Rfl:    Vitamin D, Ergocalciferol, (DRISDOL) 1.25 MG (50000 UNIT) CAPS capsule, TAKE 1 CAPSULE (50,000 UNITS TOTAL) BY MOUTH EVERY 7 (SEVEN) DAYS, Disp: 4 capsule, Rfl: 1   Medications ordered in this encounter:  Meds  ordered this encounter  Medications   clindamycin (CLEOCIN) 300 MG capsule    Sig: Take 1 capsule (300 mg total) by mouth 3 (three) times daily.    Dispense:  30 capsule    Refill:  0    Order Specific Question:   Supervising Provider    Answer:   Merrilee Jansky [id number]   ibuprofen (ADVIL) 600 MG tablet    Sig: Take 1 tablet (600 mg total) by mouth every 8 (eight) hours as needed.    Dispense:  30 tablet    Refill:  0    Order Specific Question:   Supervising Provider    Answer:   Merrilee Jansky [id number]     *If you need refills on other medications prior to your next appointment, please contact your pharmacy*  Follow-Up: Call back or seek an in-person evaluation if the symptoms worsen or if the condition fails to improve as anticipated.  [facility] [phone]  Other Instructions Follow up with dentist as soon as possible   If you have been instructed to have an in-person evaluation today at a local Urgent Care facility, please use the link below. It will take you to a list of all of our available [facility], including address, phone number and hours of operation. Please do not delay care.  [facility]  If you or a family member do  not have a primary care provider, use the link below to schedule a visit and establish care. When you choose a [facility] primary care physician or advanced practice provider, you gain a long-term partner in health. Find a Primary Care Provider  Learn more about [facility]'s in-office and virtual care options: [facility] - Get Care Now

## 2022-10-28 ENCOUNTER — Encounter: Payer: Self-pay | Admitting: Nurse Practitioner

## 2022-10-30 ENCOUNTER — Other Ambulatory Visit: Payer: Self-pay | Admitting: Medical

## 2022-11-02 ENCOUNTER — Encounter: Payer: Self-pay | Admitting: Medical

## 2022-11-02 NOTE — Telephone Encounter (Signed)
Pt had telehealth visit on 10/27/22 for dental pain

## 2022-11-05 MED ORDER — FLUCONAZOLE 150 MG PO TABS: 150.0000 mg | ORAL_TABLET | Freq: Every day | ORAL | 0 refills | Status: DC

## 2022-11-05 NOTE — Addendum Note (Signed)
Addended by: Gwenevere Abbot on: 11/05/2022 07:14 PM   Modules accepted: Orders

## 2022-11-16 ENCOUNTER — Telehealth: Payer: BC Managed Care – PPO | Admitting: Family Medicine

## 2022-11-16 DIAGNOSIS — K0889 Other specified disorders of teeth and supporting structures: Secondary | ICD-10-CM | POA: Diagnosis not present

## 2022-11-16 DIAGNOSIS — K047 Periapical abscess without sinus: Secondary | ICD-10-CM | POA: Diagnosis not present

## 2022-11-16 MED ORDER — CLINDAMYCIN HCL 300 MG PO CAPS: 300.0000 mg | ORAL_CAPSULE | Freq: Three times a day (TID) | ORAL | 0 refills | Status: DC

## 2022-11-16 NOTE — Progress Notes (Signed)
Virtual Visit Consent   April Rodgers, you are scheduled for a virtual visit with a [facility] provider today. Just as with appointments in the office, your consent must be obtained to participate. Your consent will be active for this visit and any virtual visit you may have with one of our providers in the next 365 days. If you have a MyChart account, a copy of this consent can be sent to you electronically.  As this is a virtual visit, video technology does not allow for your provider to perform a traditional examination. This may limit your provider's ability to fully assess your condition. If your provider identifies any concerns that need to be evaluated in person or the need to arrange testing (such as labs, EKG, etc.), we will make arrangements to do so. Although advances in technology are sophisticated, we cannot ensure that it will always work on either your end or our end. If the connection with a video visit is poor, the visit may have to be switched to a telephone visit. With either a video or telephone visit, we are not always able to ensure that we have a secure connection.  By engaging in this virtual visit, you consent to the provision of healthcare and authorize for your insurance to be billed (if applicable) for the services provided during this visit. Depending on your insurance coverage, you may receive a charge related to this service.  I need to obtain your verbal consent now. Are you willing to proceed with your visit today? April Rodgers has provided verbal consent on 11/16/2022 for a virtual visit (video or telephone). Georgana Curio, FNP  Date: 11/16/2022 2:48 PM  Virtual Visit via Video Note   I, Georgana Curio, connected with  April Rodgers  ([id number], 1985-11-29) on 11/16/22 at  2:45 PM EDT by a video-enabled telemedicine application and verified that I am speaking with the correct person using two identifiers.  Location: Patient: Virtual Visit Location Patient:  Home Provider: Virtual Visit Location Provider: Home Office   I discussed the limitations of evaluation and management by telemedicine and the availability of in person appointments. The patient expressed understanding and agreed to proceed.    History of Present Illness: April Rodgers is a 37 y.o. who identifies as a female who was assigned female at birth, and is being seen today for left sided dental infection. No fever. She was treated with clindamycin 3 weeks ago and has root canal next week. She is having pain again.   HPI: HPI  Problems:  Patient Active Problem List   Diagnosis Date Noted   Superficial vein thrombosis 04/30/2022   Iron deficiency anemia 03/26/2022   Pregnancy 07/05/2017   Term pregnancy 05/17/2016   Chorioamnionitis in second trimester 03/10/2015   Twin pregnancy, antepartum 03/10/2015   Vaginal delivery 03/10/2015   PROM (premature rupture of membranes) 03/04/2015    Allergies:  Allergies  Allergen Reactions   Iron Sucrose Swelling and Other (See Comments)    Discomfort to bilateral feet, unilateral swelling of hand.   Penicillins Hives and Rash    Has patient had a PCN reaction causing immediate rash, facial/tongue/throat swelling, SOB or lightheadedness with hypotension: Yes Has patient had a PCN reaction causing severe rash involving mucus membranes or skin necrosis: No Has patient had a PCN reaction that required hospitalization No Has patient had a PCN reaction occurring within the last 10 years: No If all of the above answers are "NO", then may proceed with  Cephalosporin use.    Medications:  Current Outpatient Medications:    acetaminophen (TYLENOL) 500 MG tablet, Take 1,000 mg by mouth every 6 (six) hours as needed for moderate pain or fever., Disp: , Rfl:    aspirin EC 81 MG tablet, Take 81 mg by mouth daily. Swallow whole., Disp: , Rfl:    clindamycin (CLEOCIN) 300 MG capsule, Take 1 capsule (300 mg total) by mouth 3 (three) times daily.,  Disp: 30 capsule, Rfl: 0   fluconazole (DIFLUCAN) 150 MG tablet, Take 1 tablet (150 mg total) by mouth daily., Disp: 1 tablet, Rfl: 0   fluconazole (DIFLUCAN) 150 MG tablet, Take 1 tablet (150 mg total) by mouth daily., Disp: 1 tablet, Rfl: 0   fluticasone (FLONASE) 50 MCG/ACT nasal spray, Place 2 sprays into both nostrils daily., Disp: 16 g, Rfl: 1   ibuprofen (ADVIL) 600 MG tablet, Take 1 tablet (600 mg total) by mouth every 8 (eight) hours as needed., Disp: 30 tablet, Rfl: 0   olopatadine (PATANOL) 0.1 % ophthalmic solution, Place 1 drop into both eyes 2 (two) times daily., Disp: 5 mL, Rfl: 4   RYALTRIS 665-25 MCG/ACT SUSP, 2 sprays each nostril twice a day as needed for runny or stuffy nose, Disp: 29 g, Rfl: 5   sertraline (ZOLOFT) 50 MG tablet, Take 50 mg by mouth daily., Disp: , Rfl:    Vitamin D, Ergocalciferol, (DRISDOL) 1.25 MG (50000 UNIT) CAPS capsule, TAKE 1 CAPSULE (50,000 UNITS TOTAL) BY MOUTH EVERY 7 (SEVEN) DAYS, Disp: 4 capsule, Rfl: 1  Observations/Objective: Patient is well-developed, well-nourished in no acute distress.  Resting comfortably  at home.  Head is normocephalic, atraumatic.  No labored breathing.  Speech is clear and coherent with logical content.  Patient is alert and oriented at baseline.    Assessment and Plan: 1. Dental infection Heat, ibuprofen as directed, keep apptmt with dentist this week.   Follow Up Instructions: I discussed the assessment and treatment plan with the patient. The patient was provided an opportunity to ask questions and all were answered. The patient agreed with the plan and demonstrated an understanding of the instructions.  A copy of instructions were sent to the patient via MyChart unless otherwise noted below.     The patient was advised to call back or seek an in-person evaluation if the symptoms worsen or if the condition fails to improve as anticipated.    Georgana Curio, FNP

## 2022-11-16 NOTE — Patient Instructions (Signed)
Dental Abscess  A dental abscess is an infection around a tooth that may involve pain, swelling, and a collection of pus, as well as other symptoms. Treatment is important to help with symptoms and to prevent the infection from spreading. The general types of dental abscesses are: Pulpal abscess. This abscess may form from the inner part of the tooth (pulp). Periodontal abscess. This abscess may form from the gum. What are the causes? This condition is caused by a bacterial infection in or around the tooth. It may result from: Severe tooth decay (cavities). Trauma to the tooth, such as a broken or chipped tooth. What increases the risk? This condition is more likely to develop in males. It is also more likely to develop in people who: Have cavities. Have severe gum disease. Eat sugary snacks between meals. Use tobacco products. Have diabetes. Have a weakened disease-fighting system (immune system). Do not brush and care for their teeth regularly. What are the signs or symptoms? Mild symptoms of this condition include: Tenderness. Bad breath. Fever. A bitter taste in the mouth. Pain in and around the infected tooth. Moderate symptoms of this condition include: Swollen neck glands. Chills. Pus drainage. Swelling and redness around the infected tooth, in the mouth, or in the face. Severe pain in and around the infected tooth. Severe symptoms of this condition include: Difficulty swallowing. Difficulty opening the mouth. Nausea. Vomiting. How is this diagnosed? This condition is diagnosed based on: Your symptoms and your medical and dental history. An examination of the infected tooth. During the exam, your dental care provider may tap on the infected tooth. You may also need to have X-rays taken of the affected area. How is this treated? This condition is treated by getting rid of the infection. This may be done with: Antibiotic medicines. These may be used in certain  situations. Antibacterial mouth rinse. Incision and drainage. This procedure is done by making an incision in the abscess to drain out the pus. Removing pus is the first priority in treating an abscess. A root canal. This may be performed to save the tooth. Your dental care provider accesses the visible part of your tooth (crown) with a drill and removes any infected pulp. Then the space is filled and sealed off. Tooth extraction. The tooth is pulled out if it cannot be saved by other treatment. You may also receive treatment for pain, such as: Acetaminophen or NSAIDs. Gels that contain a numbing medicine. An injection to block the pain near your nerve. Follow these instructions at home: Medicines Take over-the-counter and prescription medicines only as told by your dental care provider. If you were prescribed an antibiotic, take it as told by your dental care provider. Do not stop taking the antibiotic even if you start to feel better. If you were prescribed a gel that contains a numbing medicine, use it exactly as told in the directions. Do not use these gels for children who are younger than 2 years of age. Use an antibacterial mouth rinse as told by your dental care provider. General instructions  Gargle with a mixture of salt and water 3-4 times a day or as needed. To make salt water, completely dissolve -1 tsp (3-6 g) of salt in 1 cup (237 mL) of warm water. Eat a soft diet while your abscess is healing. Drink enough fluid to keep your urine pale yellow. Do not apply heat to the outside of your mouth. Do not use any products that contain nicotine or tobacco. These   products include cigarettes, chewing tobacco, and vaping devices, such as e-cigarettes. If you need help quitting, ask your dental care provider. Keep all follow-up visits. This is important. How is this prevented?  Excellent dental home care, which includes brushing your teeth every morning and night with fluoride  toothpaste. Floss one time each day. Get regularly scheduled dental cleanings. Consider having a dental sealant applied on teeth that have deep grooves to prevent cavities. Drink fluoridated water regularly. This includes most tap water. Check the label on bottled water to see if it contains fluoride. Reduce or eliminate sugary drinks. Eat healthy meals and snacks. Wear a mouth guard or face shield to protect your teeth while playing sports. Contact a health care provider if: Your pain is worse and is not helped by medicine. You have swelling. You see pus around the tooth. You have a fever or chills. Get help right away if: Your symptoms suddenly get worse. You have a very bad headache. You have problems breathing or swallowing. You have trouble opening your mouth. You have swelling in your neck or around your eye. These symptoms may represent a serious problem that is an emergency. Do not wait to see if the symptoms will go away. Get medical help right away. Call your local emergency services (911 in the [place] Do not drive yourself to the hospital. Summary A dental abscess is a collection of pus in or around a tooth that results from an infection. A dental abscess may result from severe tooth decay, trauma to the tooth, or severe gum disease around a tooth. Symptoms include severe pain, swelling, redness, and drainage of pus in and around the infected tooth. The first priority in treating a dental abscess is to drain out the pus. Treatment may also involve removing damage inside the tooth (root canal) or extracting the tooth. This information is not intended to replace advice given to you by your health care provider. Make sure you discuss any questions you have with your health care provider. Document Revised: 03/31/2020 Document Reviewed: 03/31/2020 Elsevier Patient Education  2024 [employer]  

## 2023-02-22 ENCOUNTER — Other Ambulatory Visit: Payer: Self-pay | Admitting: Physician Assistant

## 2023-02-22 DIAGNOSIS — E559 Vitamin D deficiency, unspecified: Secondary | ICD-10-CM

## 2023-03-20 ENCOUNTER — Other Ambulatory Visit: Payer: Self-pay | Admitting: Physician Assistant

## 2023-03-28 ENCOUNTER — Telehealth: Payer: BC Managed Care – PPO | Admitting: Nurse Practitioner

## 2023-03-28 DIAGNOSIS — K047 Periapical abscess without sinus: Secondary | ICD-10-CM

## 2023-03-28 MED ORDER — MELOXICAM 15 MG PO TABS: 15.0000 mg | ORAL_TABLET | Freq: Every day | ORAL | 0 refills | Status: DC

## 2023-03-28 MED ORDER — CLINDAMYCIN HCL 300 MG PO CAPS: 300.0000 mg | ORAL_CAPSULE | Freq: Three times a day (TID) | ORAL | 0 refills | Status: AC

## 2023-03-28 NOTE — Addendum Note (Signed)
Addended by: Waldon Merl on: 03/28/2023 08:54 AM   Modules accepted: Orders

## 2023-03-28 NOTE — Progress Notes (Signed)

## 2023-07-09 ENCOUNTER — Ambulatory Visit (HOSPITAL_BASED_OUTPATIENT_CLINIC_OR_DEPARTMENT_OTHER)
Admission: RE | Admit: 2023-07-09 | Discharge: 2023-07-09 | Disposition: A | Discharge status: Home / Self Care | Source: Ambulatory Visit | Attending: Medical | Admitting: Medical

## 2023-07-09 ENCOUNTER — Ambulatory Visit: Payer: Self-pay | Admitting: Medical

## 2023-07-09 ENCOUNTER — Ambulatory Visit (INDEPENDENT_AMBULATORY_CARE_PROVIDER_SITE_OTHER): Admitting: Medical

## 2023-07-09 DIAGNOSIS — R059 Cough, unspecified: Secondary | ICD-10-CM | POA: Insufficient documentation

## 2023-07-09 DIAGNOSIS — J029 Acute pharyngitis, unspecified: Secondary | ICD-10-CM

## 2023-07-09 DIAGNOSIS — D649 Anemia, unspecified: Secondary | ICD-10-CM | POA: Diagnosis not present

## 2023-07-09 DIAGNOSIS — R058 Other specified cough: Secondary | ICD-10-CM | POA: Diagnosis not present

## 2023-07-09 DIAGNOSIS — R042 Hemoptysis: Secondary | ICD-10-CM | POA: Insufficient documentation

## 2023-07-09 LAB — CBC WITH DIFFERENTIAL/PLATELET
Basophils Absolute: 0 10*3/uL (ref 0.0–0.1)
Basophils Relative: 0.8 % (ref 0.0–3.0)
Eosinophils Absolute: 0 10*3/uL (ref 0.0–0.7)
Eosinophils Relative: 0.7 % (ref 0.0–5.0)
HCT: 32.9 % — ABNORMAL LOW (ref 36.0–46.0)
Hemoglobin: 10.8 g/dL — ABNORMAL LOW (ref 12.0–15.0)
Lymphocytes Relative: 47.5 % — ABNORMAL HIGH (ref 12.0–46.0)
Lymphs Abs: 1.6 10*3/uL (ref 0.7–4.0)
MCHC: 32.7 g/dL (ref 30.0–36.0)
MCV: 77.9 fl — ABNORMAL LOW (ref 78.0–100.0)
Monocytes Absolute: 0.5 10*3/uL (ref 0.1–1.0)
Monocytes Relative: 13.6 % — ABNORMAL HIGH (ref 3.0–12.0)
Neutro Abs: 1.3 10*3/uL — ABNORMAL LOW (ref 1.4–7.7)
Neutrophils Relative %: 37.4 % — ABNORMAL LOW (ref 43.0–77.0)
Platelets: 229 10*3/uL (ref 150.0–400.0)
RBC: 4.23 Mil/uL (ref 3.87–5.11)
RDW: 21.1 % — ABNORMAL HIGH (ref 11.5–15.5)
WBC: 3.5 10*3/uL — ABNORMAL LOW (ref 4.0–10.5)

## 2023-07-09 LAB — POCT RAPID STREP A (OFFICE): Rapid Strep A Screen: NEGATIVE

## 2023-07-09 MED ORDER — BENZONATATE 100 MG PO CAPS: 100.0000 mg | ORAL_CAPSULE | Freq: Three times a day (TID) | ORAL | 0 refills | Status: DC | PRN

## 2023-07-09 MED ORDER — FLUCONAZOLE 150 MG PO TABS: 150.0000 mg | ORAL_TABLET | Freq: Every day | ORAL | 0 refills | Status: DC

## 2023-07-09 MED ORDER — METHYLPREDNISOLONE 4 MG PO TABS: ORAL_TABLET | ORAL | 0 refills | Status: DC

## 2023-07-09 MED ORDER — AZITHROMYCIN 250 MG PO TABS: ORAL_TABLET | ORAL | 0 refills | Status: AC

## 2023-07-09 NOTE — Addendum Note (Signed)
 Addended by: Serafina Damme on: 07/09/2023 04:51 PM   Modules accepted: Orders

## 2023-07-09 NOTE — Progress Notes (Signed)
 Subjective:    Patient ID: April Rodgers, female    DOB: 1985/12/13, 38 y.o.   MRN: [id number]  HPI  Discussed the use of AI scribe software for clinical note transcription with the patient, who gave verbal consent to proceed.  History of Present Illness   April Rodgers is a 38 year old female who presents with a sore throat and productive cough for three to four weeks.  She has had a sore throat for three to four weeks, initially described as sore and painful on the outside, with persistent pain and discomfort when swallowing. There has been no recent exposure to individuals with a sore throat and no prior evaluation for this issue.  She describes her mucus as green with occasional blood tinges, noting 'little red spots' in it. She has been coughing up mucus, which is sometimes blood-tinged. There is no associated sinus pressure or tooth pain.  She reports feeling cold but denies body aches, muscle aches, fevers, or sweats. She does not smoke. Her last menstrual cycle was last Monday. some chills at times.  In October, she had a dental visit where she received antibiotics and underwent a root canal.       Lmp- a week ago.  Review of Systems  Constitutional:  Positive for chills and fatigue. Negative for fever.  HENT:  Positive for sore throat. Negative for congestion, ear pain, postnasal drip, sinus pressure, sinus pain and trouble swallowing.   Respiratory:  Positive for cough. Negative for chest tightness, shortness of breath and wheezing.   Cardiovascular:  Negative for chest pain and palpitations.  Gastrointestinal:  Negative for abdominal distention.  Genitourinary:  Negative for dysuria.  Musculoskeletal:  Positive for myalgias. Negative for back pain.  Skin:  Negative for rash.  Hematological:  Negative for adenopathy.  Psychiatric/Behavioral:  Negative for behavioral problems and dysphoric mood.     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   History of chorioamnionitis    03-10-2015   History of fetal loss    03-10-2015  non-viable fetal demise twins at [redacted]w[redacted]d due to chorioamnionitis infection with PPROM   Hx of varicella    Hydrosalpinx    RIGHT   Migraine    Normal exercise tolerance test results    03-22-2006   Obstruction of fallopian tube    bilateral occlusion   Scoliosis      Social History   Socioeconomic History   Marital status: Married    Spouse name: Not on file   Number of children: Not on file   Years of education: Not on file   Highest education level: Not on file  Occupational History   Not on file  Tobacco Use   Smoking status: Never    Passive exposure: Never   Smokeless tobacco: Never  Vaping Use   Vaping status: Never Used  Substance and Sexual Activity   Alcohol use: No   Drug use: No   Sexual activity: Yes  Other Topics Concern   Not on file  Social History Narrative   Not on file   Social Drivers of Health   Financial Resource Strain: Not on file  Food Insecurity: Not on file  Transportation Needs: Not on file  Physical Activity: Not on file  Stress: Not on file  Social Connections: Unknown (09/16/2021)   Received from [facility], [facility]   Social Network    Social Network: Not on file  Intimate Partner Violence: Unknown (09/16/2021)   Received from [facility], [facility]   HITS    Physically Hurt: Not on file    Insult or Talk Down To: Not on file    Threaten Physical Harm: Not on file    Scream or Curse: Not on file    Past Surgical History:  Procedure Laterality Date   DILATION AND CURETTAGE OF UTERUS  2006   HYSTEROSCOPY WITH D & C N/A 06/28/2015   Procedure: SUCTION DILATATION AND CURETTAGE /HYSTEROSCOPY, ;  Surgeon: Asa Bjork, MD;  Location:  [facility];  Service: Gynecology;  Laterality: N/A;   LAPAROSCOPY Left 06/28/2015   Procedure: LAPAROSCOPY LYSIS OF ADHESIONS, RIGHT SALPINGECTOMY WITH NOVY CATHERIZATION OF LEFT TUBE,EXCISION OF ENDOMETROSIS;  Surgeon: Asa Bjork, MD;  Location: [facility];  Service: Gynecology;  Laterality: Left;   TONSILLECTOMY  2012   WISDOM TOOTH EXTRACTION  2016    Family History  Problem Relation Age of Onset   Anemia Mother    Allergic rhinitis Daughter    Eczema Daughter    Allergies Daughter    Allergic rhinitis Son    Allergies Son    Allergies Son    Pneumonia Son     Allergies  Allergen Reactions   Iron  Sucrose Swelling and Other (See Comments)    Discomfort to bilateral feet, unilateral swelling of hand.   Penicillins Hives and Rash    Has patient had a PCN reaction causing immediate rash, facial/tongue/throat swelling, SOB or lightheadedness with hypotension: Yes Has patient had a PCN reaction causing severe rash involving mucus membranes or skin necrosis: No Has patient had a PCN reaction that required hospitalization No Has patient had a PCN reaction occurring within the last 10 years: No If all of the above answers are "NO", then may proceed with Cephalosporin use.     Current Outpatient Medications on File Prior to Visit  Medication Sig Dispense Refill   acetaminophen  (TYLENOL ) 500 MG tablet Take 1,000 mg by mouth every 6 (six) hours as needed for moderate pain or fever.     aspirin EC 81 MG tablet Take 81 mg by mouth daily. Swallow whole.     fluconazole  (DIFLUCAN ) 150 MG tablet Take 1 tablet (150 mg total) by mouth daily. 1 tablet 0   fluconazole  (DIFLUCAN ) 150 MG tablet Take 1 tablet (150 mg total) by mouth daily. 1 tablet 0   fluticasone  (FLONASE ) 50 MCG/ACT nasal spray Place 2 sprays into both nostrils daily. 16 g 1   ibuprofen  (ADVIL ) 600 MG tablet Take 1 tablet (600 mg total) by mouth every 8 (eight) hours as needed. 30 tablet 0    meloxicam  (MOBIC ) 15 MG tablet Take 1 tablet (15 mg total) by mouth daily. 30 tablet 0   olopatadine  (PATANOL) 0.1 % ophthalmic solution Place 1 drop into both eyes 2 (two) times daily. 5 mL 4   RYALTRIS  665-25 MCG/ACT SUSP 2 sprays each nostril twice a day as needed for runny or stuffy nose 29 g 5   sertraline (ZOLOFT) 50 MG tablet Take 50 mg by mouth daily.     Vitamin D , Ergocalciferol , (DRISDOL ) 1.25 MG (50000 UNIT) CAPS capsule TAKE 1 CAPSULE (50,000 UNITS TOTAL) BY MOUTH EVERY 7 (  SEVEN) DAYS 4 capsule 1   No current facility-administered medications on file prior to visit.    BP 126/84   Pulse 82   Resp 18   Ht 5\' 9"  (1.753 m)   Wt 146 lb (66.2 kg)   LMP 07/01/2023   SpO2 100%   BMI 21.56 kg/m        Objective:   Physical Exam  General- No acute distress. Pleasant patient. Neck- Full range of motion, no jvd Lungs- Clear, even and unlabored. Heart- regular rate and rhythm. Neurologic- CNII- XII grossly intact.  Heent- mild red pharynx but no tonsils seen.  No sinus pressure. Ears canals clear and tm normal. No obvious submandibular node tenderness to palpation. Back- no cva pain.      Assessment & Plan:   Assessment and Plan    Patient Instructions  Pharyngitis and some bronchitis signs/symptoms for one month. Pharyngitis with productive cough and sore throat for three to four weeks. Differential includes bacterial infection, possibly streptococcal. - Perform rapid strep test was negative, send throat culture. - Prescribe azithromycin  for five days. - Order chest x-ray to rule out pneumonia. - Prescribe Tessalon  Perles every eight hours as needed. - Advise follow-up in seven to ten days if symptoms persist.   (Probable sciatica)Left side back pain for 3-4 months. Gradually worse but no radicular pain to legs. No mid spine pain.  No uti signs/symptoms. Tylenol  will help with pain. Pain is in sciatic area on exam. -Since pain for 3-4 month and on exam in left si  area decided to rx medrol  6 day taper -will see how you do with medrol .  -if pain persist consider referra to sports med.   Raelin Pixler, PA-C

## 2023-07-09 NOTE — Patient Instructions (Addendum)
 Pharyngitis and some bronchitis signs/symptoms for one month. Pharyngitis with productive cough and sore throat for three to four weeks. Differential includes bacterial infection, possibly streptococcal. - Perform rapid strep test was negative, send throat culture. - Prescribe azithromycin  for five days. - Order chest x-ray to rule out pneumonia. - Prescribe Tessalon  Perles every eight hours as needed. - Advise follow-up in seven to ten days if symptoms persist.   (Probable sciatica)Left side back pain for 3-4 months. Gradually worse but no radicular pain to legs. No mid spine pain.  No uti signs/symptoms. Tylenol  will help with pain. Pain is in sciatic area on exam. -Since pain for 3-4 month and on exam in left si area decided to rx medrol  6 day taper -will see how you do with medrol .  -if pain persist consider referra to sports med.

## 2023-07-11 ENCOUNTER — Encounter: Payer: Self-pay | Admitting: Medical

## 2023-07-11 LAB — CULTURE, GROUP A STREP
Micro Number: 16532273
SPECIMEN QUALITY:: ADEQUATE

## 2023-07-11 LAB — IRON,TIBC AND FERRITIN PANEL
%SAT: 25 % (ref 16–45)
Ferritin: 12 ng/mL — ABNORMAL LOW (ref 16–154)
Iron: 71 ug/dL (ref 40–190)
TIBC: 288 ug/dL (ref 250–450)

## 2023-07-12 ENCOUNTER — Encounter: Payer: Self-pay | Admitting: Medical

## 2023-07-15 NOTE — Addendum Note (Signed)
 Addended by: Serafina Damme on: 07/15/2023 08:21 PM   Modules accepted: Orders

## 2023-08-01 ENCOUNTER — Inpatient Hospital Stay

## 2023-08-01 ENCOUNTER — Inpatient Hospital Stay: Admitting: Medical Oncology

## 2023-08-13 ENCOUNTER — Encounter: Payer: Self-pay | Admitting: Medical Oncology

## 2023-08-13 ENCOUNTER — Inpatient Hospital Stay: Attending: Medical Oncology | Admitting: Medical Oncology

## 2023-08-13 ENCOUNTER — Inpatient Hospital Stay

## 2023-08-13 VITALS — BP 114/71 | HR 92 | Temp 98.7°F | Resp 18 | Ht 69.0 in | Wt 143.0 lb

## 2023-08-13 DIAGNOSIS — R161 Splenomegaly, not elsewhere classified: Secondary | ICD-10-CM

## 2023-08-13 DIAGNOSIS — D509 Iron deficiency anemia, unspecified: Secondary | ICD-10-CM | POA: Diagnosis not present

## 2023-08-13 DIAGNOSIS — E611 Iron deficiency: Secondary | ICD-10-CM | POA: Diagnosis not present

## 2023-08-13 DIAGNOSIS — D649 Anemia, unspecified: Secondary | ICD-10-CM | POA: Insufficient documentation

## 2023-08-13 LAB — CBC WITH DIFFERENTIAL (CANCER CENTER ONLY)
Abs Immature Granulocytes: 0.01 K/uL (ref 0.00–0.07)
Basophils Absolute: 0 K/uL (ref 0.0–0.1)
Basophils Relative: 1 %
Eosinophils Absolute: 0.1 K/uL (ref 0.0–0.5)
Eosinophils Relative: 1 %
HCT: 32.1 % — ABNORMAL LOW (ref 36.0–46.0)
Hemoglobin: 10.8 g/dL — ABNORMAL LOW (ref 12.0–15.0)
Immature Granulocytes: 0 %
Lymphocytes Relative: 35 %
Lymphs Abs: 1.7 K/uL (ref 0.7–4.0)
MCH: 25.6 pg — ABNORMAL LOW (ref 26.0–34.0)
MCHC: 33.6 g/dL (ref 30.0–36.0)
MCV: 76.1 fL — ABNORMAL LOW (ref 80.0–100.0)
Monocytes Absolute: 0.6 K/uL (ref 0.1–1.0)
Monocytes Relative: 13 %
Neutro Abs: 2.5 K/uL (ref 1.7–7.7)
Neutrophils Relative %: 50 %
Platelet Count: 211 K/uL (ref 150–400)
RBC: 4.22 MIL/uL (ref 3.87–5.11)
RDW: 20.9 % — ABNORMAL HIGH (ref 11.5–15.5)
WBC Count: 5 K/uL (ref 4.0–10.5)
nRBC: 0 % (ref 0.0–0.2)

## 2023-08-13 LAB — VITAMIN B12: Vitamin B-12: 426 pg/mL (ref 180–914)

## 2023-08-13 LAB — SAVE SMEAR(SSMR), FOR PROVIDER SLIDE REVIEW

## 2023-08-13 LAB — FOLATE: Folate: 7 ng/mL (ref >5.9)

## 2023-08-13 LAB — FERRITIN: Ferritin: 47 ng/mL (ref 11–307)

## 2023-08-13 LAB — LACTATE DEHYDROGENASE: LDH: 120 U/L (ref 98–192)

## 2023-08-13 NOTE — Progress Notes (Signed)
 [facility] [phone]   [phone]  INITIAL CONSULT NOTE  Patient Care Team: Saguier, [place] RIGGERS as PCP - General (Internal Medicine)  CHIEF COMPLAINTS/PURPOSE OF CONSULTATION:  Abnormal CBC and Anemia  HISTORY OF PRESENTING ILLNESS:  April Rodgers 38 y.o. female is referred to our office by their PCP Edward Saguier PA-C for anemia:  She was previously seen by Dr. Lonn. She was given IV Venofer  on 04/16/2022 and 04/23/2022. After both infusions she had swelling of hands and feet. Reaction responded well to benadryl . She has not had any supplemental iron  since. Former oral iron  caused significant constipation.   It appears that their most recent labs showed a WBC of 3.5, Hgb of 10.8, MCV of 77.9, ANC of 1.3, Neutro relative 37.4%, lymph relative 47.5%, monocytes relative 13.6%, platelets of 229. Iron  71, TIBC 288, iron  saturation of 25%, ferritin 12.   She has had IDA since around 2009. She has also had an SPEP which was normal on 02/14/2022. She does report being suggested to have a BMB done when she was pregnant due to abnormal CBC results. She did not have this performed.   She does have a history of Asherman's Syndrome. She was diagnosed with this around 2017. She does have heavy menstrual cycles. She had endometrial surgery in 2017 which has helped lighten her menstrual cycles as bit. Menstrual cycles last about 3-5 days. Flow is considered super heavy for the first 2 days then lightening. Menstrual cycles are regular in nature.   Blood in stools:No Change of bowel movement/constipation: No Family history of GI cancers: No Last Colonoscopy:N/A Last Endoscopy:N/A History of GERD or GI bleed: No UC/Crohn's: No PPI use: No Hiatal Hernia: No NSAID use: No Blood in urine: No Difficulty swallowing: No Pica: Sometimes SOB: No Fatigue: Yes Prior blood transfusion: No Prior history of blood loss: No Liver disease: No Kidney Disease:  No Bariatric/Intestinal surgery: No Frequent Blood Donation: No Vaginal bleeding: See above Prior evaluation with hematology: No Prior bone marrow biopsy: No Oral iron : No- previously taking but this made her extremely constipated and did not improve counts.  Prior IV iron  infusions: Yes- last years and during pregnancy. She had an allergic reaction- swelling of hands and feet. She has this at  Family history Anemia: No known sickle cell or thalassemia  No Personal or family history of bleeding or clotting disorders.  Special diets: No No use of GLP-1: No Iron  rich foods in diet: Yes Eating habits: 3 meals daily   No night sweats, unintentional weight loss, lump/bumps of concern.   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   History of chorioamnionitis    03-10-2015   History of fetal loss    03-10-2015  non-viable fetal demise twins at [redacted]w[redacted]d due to chorioamnionitis infection with PPROM   Hx of varicella    Hydrosalpinx    RIGHT   Migraine    Normal exercise tolerance test results    03-22-2006   Obstruction of fallopian tube    bilateral occlusion   Scoliosis     SURGICAL HISTORY: Past Surgical History:  Procedure Laterality Date   DILATION AND CURETTAGE OF UTERUS  2006  HYSTEROSCOPY WITH D & C N/A 06/28/2015   Procedure: SUCTION DILATATION AND CURETTAGE /HYSTEROSCOPY, ;  Surgeon: Cynthia Loss, MD;  Location: [facility];  Service: Gynecology;  Laterality: N/A;   LAPAROSCOPY Left 06/28/2015   Procedure: LAPAROSCOPY LYSIS OF ADHESIONS, RIGHT SALPINGECTOMY WITH NOVY CATHERIZATION OF LEFT TUBE,EXCISION OF ENDOMETROSIS;  Surgeon: Cynthia Loss, MD;  Location: [facility];  Service: Gynecology;  Laterality:  Left;   TONSILLECTOMY  2012   WISDOM TOOTH EXTRACTION  2016    SOCIAL HISTORY: Social History   Socioeconomic History   Marital status: Married    Spouse name: Not on file   Number of children: Not on file   Years of education: Not on file   Highest education level: Not on file  Occupational History   Not on file  Tobacco Use   Smoking status: Never    Passive exposure: Never   Smokeless tobacco: Never  Vaping Use   Vaping status: Never Used  Substance and Sexual Activity   Alcohol use: No   Drug use: No   Sexual activity: Yes  Other Topics Concern   Not on file  Social History Narrative   Not on file   Social Drivers of Health   Financial Resource Strain: Not on file  Food Insecurity: No Food Insecurity (08/13/2023)   Hunger Vital Sign    Worried About Running Out of Food in the Last Year: Never true    Ran Out of Food in the Last Year: Never true  Transportation Needs: No Transportation Needs (08/13/2023)   PRAPARE - Administrator, Civil Service (Medical): No    Lack of Transportation (Non-Medical): No  Physical Activity: Not on file  Stress: Not on file  Social Connections: Unknown (09/16/2021)   [facility]   Social Network    Social Network: Not on file  Intimate Partner Violence: Not At Risk (08/13/2023)   Humiliation, Afraid, Rape, and Kick questionnaire    Fear of Current or Ex-Partner: No    Emotionally Abused: No    Physically Abused: No    Sexually Abused: No    FAMILY HISTORY: Family History  Problem Relation Age of Onset   Anemia Mother    Allergic rhinitis Daughter    Eczema Daughter    Allergies Daughter    Allergic rhinitis Son    Allergies Son    Allergies Son    Pneumonia Son     ALLERGIES:  is allergic to iron  sucrose and penicillins.  MEDICATIONS:  Current Outpatient Medications  Medication Sig Dispense Refill   acetaminophen  (TYLENOL ) 500 MG tablet Take 1,000 mg by mouth every 6 (six) hours as needed  for moderate pain or fever.     fluticasone  (FLONASE ) 50 MCG/ACT nasal spray Place 2 sprays into both nostrils daily. 16 g 1   ibuprofen  (ADVIL ) 600 MG tablet Take 1 tablet (600 mg total) by mouth every 8 (eight) hours as needed. 30 tablet 0   olopatadine  (PATANOL) 0.1 % ophthalmic solution Place 1 drop into both eyes 2 (two) times daily. 5 mL 4   RYALTRIS  665-25 MCG/ACT SUSP 2 sprays each nostril twice a day as needed for runny or stuffy nose 29 g 5   aspirin EC 81 MG tablet Take 81 mg by mouth daily. Swallow whole. (Patient not taking: Reported on 08/13/2023)     benzonatate  (TESSALON ) 100 MG capsule Take 1 capsule (100 mg total) by mouth 3 (three) times daily as needed  for cough. (Patient not taking: Reported on 08/13/2023) 30 capsule 0   fluconazole  (DIFLUCAN ) 150 MG tablet Take 1 tablet (150 mg total) by mouth daily. (Patient not taking: Reported on 08/13/2023) 1 tablet 0   meloxicam  (MOBIC ) 15 MG tablet Take 1 tablet (15 mg total) by mouth daily. (Patient not taking: Reported on 08/13/2023) 30 tablet 0   sertraline (ZOLOFT) 50 MG tablet Take 50 mg by mouth daily. (Patient not taking: Reported on 08/13/2023)     Vitamin D , Ergocalciferol , (DRISDOL ) 1.25 MG (50000 UNIT) CAPS capsule TAKE 1 CAPSULE (50,000 UNITS TOTAL) BY MOUTH EVERY 7 (SEVEN) DAYS (Patient not taking: Reported on 08/13/2023) 4 capsule 1   No current facility-administered medications for this visit.    REVIEW OF SYSTEMS:   Constitutional: ( - ) fevers, ( - )  chills , ( - ) night sweats Eyes: ( - ) blurriness of vision, ( - ) double vision, ( - ) watery eyes Ears, nose, mouth, throat, and face: ( - ) mucositis, ( - ) sore throat Respiratory: ( - ) cough, ( - ) dyspnea, ( - ) wheezes Cardiovascular: ( - ) palpitation, ( - ) chest discomfort, ( - ) lower extremity swelling Gastrointestinal:  ( - ) nausea, ( - ) heartburn, ( - ) change in bowel habits Skin: ( - ) abnormal skin rashes Lymphatics: ( - ) new lymphadenopathy, ( - ) easy  bruising Neurological: ( - ) numbness, ( - ) tingling, ( - ) new weaknesses Behavioral/Psych: ( - ) mood change, ( - ) new changes  All other systems were reviewed with the patient and are negative.  PHYSICAL EXAMINATION: ECOG PERFORMANCE STATUS: 1 - Symptomatic but completely ambulatory  Vitals:   08/13/23 1308  BP: 114/71  Pulse: 92  Resp: 18  Temp: 98.7 F (37.1 C)  SpO2: 100%   Filed Weights   08/13/23 1308  Weight: 143 lb 0.6 oz (64.9 kg)    GENERAL: well appearing female in NAD  SKIN: skin color, texture, turgor are normal, no rashes or significant lesions EYES: conjunctiva are pink and non-injected, sclera clear OROPHARYNX: no exudate, no erythema; lips, buccal mucosa, and tongue normal  NECK: supple, non-tender LYMPH:  no palpable lymphadenopathy in the cervical, axillary or supraclavicular lymph nodes.  LUNGS: clear to auscultation and percussion with normal breathing effort HEART: regular rate & rhythm and no murmurs and no lower extremity edema ABDOMEN: soft, non-tender, non-distended, normal bowel sounds. Palpable spleen without tenderness Musculoskeletal: no cyanosis of digits and no clubbing  PSYCH: alert & oriented x 3, fluent speech NEURO: no focal motor/sensory deficits  LABORATORY DATA:  Pending    ASSESSMENT & PLAN April Rodgers is a 38 y.o. African American female who was referred to us  for anemia:  Anemia is chronic in nature. Multifactorial in nature as she appears to have had malabsorption as well as heavy menstrual loss. Other potential causes still exist. For this reason I am running additional labs today and will monitor her counts closely. If BCC is not responding to iron  as expected I have suggested a GI consult. Given WBC changes in her last differential she may benefit from extra work up possibly including a BMB as well. Repeat pending today.   She denies chance of pregnancy.   Labs today Abdominal US  given her splenomegaly.  Ferrlicit  with premeds once weekly x 4  RTC 3 months APP, labs (CBC w/, CMP, LDH, iron , ferritin, retic, smear)  All questions were  answered. The patient knows to call the clinic with any problems, questions or concerns.  I have spent a total of 40 minutes minutes of face-to-face and non-face-to-face time, preparing to see the patient, obtaining and/or reviewing separately obtained history, performing a medically appropriate examination, counseling and educating the patient, ordering medications/tests/procedures, referring and communicating with other health care professionals, documenting clinical information in the electronic health record, independently interpreting results and communicating results to the patient, and care coordination.    Lauraine Dais PA-C Department of Hematology/Oncology [facility]

## 2023-08-14 LAB — IRON AND IRON BINDING CAPACITY (CC-WL,HP ONLY)
Iron: 42 ug/dL (ref 28–170)
Saturation Ratios: 13 % (ref 10.4–31.8)
TIBC: 333 ug/dL (ref 250–450)
UIBC: 291 ug/dL (ref 148–442)

## 2023-08-15 ENCOUNTER — Ambulatory Visit: Payer: Self-pay | Admitting: Medical Oncology

## 2023-08-17 ENCOUNTER — Ambulatory Visit (HOSPITAL_BASED_OUTPATIENT_CLINIC_OR_DEPARTMENT_OTHER)
Admission: RE | Admit: 2023-08-17 | Discharge: 2023-08-17 | Disposition: A | Discharge status: Home / Self Care | Source: Ambulatory Visit | Attending: Medical Oncology | Admitting: Medical Oncology

## 2023-08-17 DIAGNOSIS — R109 Unspecified abdominal pain: Secondary | ICD-10-CM | POA: Diagnosis not present

## 2023-08-17 DIAGNOSIS — R161 Splenomegaly, not elsewhere classified: Secondary | ICD-10-CM | POA: Diagnosis not present

## 2023-08-30 ENCOUNTER — Telehealth: Payer: Self-pay | Admitting: Medical Oncology

## 2023-08-30 NOTE — Telephone Encounter (Signed)
 tried to contact patient for scheduling of iv iron  x4 weekly with premeds per inbasket and vm was full. Unable to leave a scheduling message.

## 2023-09-09 ENCOUNTER — Telehealth: Payer: Self-pay | Admitting: Family

## 2023-09-09 NOTE — Telephone Encounter (Signed)
 Called to schedule Infusions per inbasket. LVM to return call for scheduling.

## 2023-09-20 ENCOUNTER — Inpatient Hospital Stay

## 2023-09-26 ENCOUNTER — Encounter: Payer: Self-pay | Admitting: Medical

## 2023-09-27 ENCOUNTER — Inpatient Hospital Stay

## 2023-10-04 ENCOUNTER — Inpatient Hospital Stay

## 2023-10-11 ENCOUNTER — Inpatient Hospital Stay

## 2023-10-11 ENCOUNTER — Inpatient Hospital Stay: Attending: Hematology & Oncology

## 2023-10-11 VITALS — BP 100/75 | HR 71 | Temp 98.1°F

## 2023-10-11 DIAGNOSIS — D509 Iron deficiency anemia, unspecified: Secondary | ICD-10-CM | POA: Insufficient documentation

## 2023-10-11 MED ORDER — SODIUM CHLORIDE 0.9 % IV SOLN: Freq: Once | INTRAVENOUS | Status: AC

## 2023-10-11 MED ORDER — FAMOTIDINE IN NACL 20-0.9 MG/50ML-% IV SOLN
20.0000 mg | Freq: Once | INTRAVENOUS | Status: AC
  Administered 2023-10-11: 20 mg via INTRAVENOUS
  Filled 2023-10-11: qty 50

## 2023-10-11 MED ORDER — METHYLPREDNISOLONE SODIUM SUCC 125 MG IJ SOLR
125.0000 mg | Freq: Once | INTRAMUSCULAR | Status: AC
  Administered 2023-10-11: 125 mg via INTRAVENOUS
  Filled 2023-10-11: qty 2

## 2023-10-11 MED ORDER — DIPHENHYDRAMINE HCL 25 MG PO CAPS
50.0000 mg | ORAL_CAPSULE | Freq: Once | ORAL | Status: AC
  Administered 2023-10-11: 50 mg via ORAL
  Filled 2023-10-11: qty 2

## 2023-10-11 MED ORDER — LORATADINE 10 MG PO TABS: 10.0000 mg | ORAL_TABLET | Freq: Once | ORAL | Status: DC

## 2023-10-11 MED ORDER — ACETAMINOPHEN 325 MG PO TABS
650.0000 mg | ORAL_TABLET | Freq: Once | ORAL | Status: AC
  Administered 2023-10-11: 650 mg via ORAL
  Filled 2023-10-11: qty 2

## 2023-10-11 MED ORDER — SODIUM CHLORIDE 0.9 % IV SOLN
125.0000 mg | Freq: Once | INTRAVENOUS | Status: AC
  Administered 2023-10-11: 125 mg via INTRAVENOUS
  Filled 2023-10-11: qty 10

## 2023-10-11 NOTE — Patient Instructions (Signed)
Sodium Ferric Gluconate Complex Injection What is this medication? SODIUM FERRIC GLUCONATE COMPLEX (SOE dee um FER ik GLOO koe nate KOM pleks) treats low levels of iron (iron deficiency anemia) in people with kidney disease. Iron is a mineral that plays an important role in making red blood cells, which carry oxygen from your lungs to the rest of your body. This medicine may be used for other purposes; ask your health care provider or pharmacist if you have questions. COMMON BRAND NAME(S): Ferrlecit, Nulecit What should I tell my care team before I take this medication? They need to know if you have any of the following conditions: Anemia that is not from iron deficiency High levels of iron in the blood An unusual or allergic reaction to iron, other medications, foods, dyes, or preservatives Pregnant or are trying to become pregnant Breast-feeding How should I use this medication? This medication is injected into a vein. It is given by your care team in a hospital or clinic setting. Talk to your care team about the use of this medication in children. While it may be prescribed for children as young as 6 years for selected conditions, precautions do apply. Overdosage: If you think you have taken too much of this medicine contact a poison control center or emergency room at once. NOTE: This medicine is only for you. Do not share this medicine with others. What if I miss a dose? It is important not to miss your dose. Call your care team if you are unable to keep an appointment. What may interact with this medication? Do not take this medication with any of the following: Deferasirox Deferoxamine Dimercaprol This medication may also interact with the following: Other iron products This list may not describe all possible interactions. Give your health care provider a list of all the medicines, herbs, non-prescription drugs, or dietary supplements you use. Also tell them if you smoke, drink  alcohol, or use illegal drugs. Some items may interact with your medicine. What should I watch for while using this medication? Your condition will be monitored carefully while you are receiving this medication. Visit your care team for regular checks on your progress. You may need blood work while you are taking this medication. What side effects may I notice from receiving this medication? Side effects that you should report to your care team as soon as possible: Allergic reactions--skin rash, itching, hives, swelling of the face, lips, tongue, or throat Low blood pressure--dizziness, feeling faint or lightheaded, blurry vision Shortness of breath Side effects that usually do not require medical attention (report to your care team if they continue or are bothersome): Flushing Headache Joint pain Muscle pain Nausea Pain, redness, or irritation at injection site This list may not describe all possible side effects. Call your doctor for medical advice about side effects. You may report side effects to FDA at 1-800-FDA-1088. Where should I keep my medication? This medication is given in a hospital or clinic and will not be stored at home. NOTE: This sheet is a summary. It may not cover all possible information. If you have questions about this medicine, talk to your doctor, pharmacist, or health care provider.  2024 Elsevier/Gold Standard (2020-06-17 00:00:00)

## 2023-10-18 ENCOUNTER — Inpatient Hospital Stay

## 2023-10-18 VITALS — BP 104/73 | HR 76 | Temp 97.8°F

## 2023-10-18 DIAGNOSIS — D509 Iron deficiency anemia, unspecified: Secondary | ICD-10-CM | POA: Diagnosis not present

## 2023-10-18 MED ORDER — FAMOTIDINE IN NACL 20-0.9 MG/50ML-% IV SOLN
20.0000 mg | Freq: Once | INTRAVENOUS | Status: AC
  Administered 2023-10-18: 20 mg via INTRAVENOUS
  Filled 2023-10-18: qty 50

## 2023-10-18 MED ORDER — ACETAMINOPHEN 325 MG PO TABS
650.0000 mg | ORAL_TABLET | Freq: Once | ORAL | Status: AC
  Administered 2023-10-18: 650 mg via ORAL
  Filled 2023-10-18: qty 2

## 2023-10-18 MED ORDER — DIPHENHYDRAMINE HCL 25 MG PO CAPS
50.0000 mg | ORAL_CAPSULE | Freq: Once | ORAL | Status: AC
  Administered 2023-10-18: 50 mg via ORAL
  Filled 2023-10-18: qty 2

## 2023-10-18 MED ORDER — METHYLPREDNISOLONE SODIUM SUCC 125 MG IJ SOLR
125.0000 mg | Freq: Once | INTRAMUSCULAR | Status: AC
  Administered 2023-10-18: 125 mg via INTRAVENOUS
  Filled 2023-10-18: qty 2

## 2023-10-18 MED ORDER — SODIUM CHLORIDE 0.9 % IV SOLN: Freq: Once | INTRAVENOUS | Status: AC

## 2023-10-18 MED ORDER — SODIUM CHLORIDE 0.9 % IV SOLN
125.0000 mg | Freq: Once | INTRAVENOUS | Status: AC
  Administered 2023-10-18: 125 mg via INTRAVENOUS
  Filled 2023-10-18: qty 10

## 2023-10-18 NOTE — Patient Instructions (Signed)
Sodium Ferric Gluconate Complex Injection What is this medication? SODIUM FERRIC GLUCONATE COMPLEX (SOE dee um FER ik GLOO koe nate KOM pleks) treats low levels of iron (iron deficiency anemia) in people with kidney disease. Iron is a mineral that plays an important role in making red blood cells, which carry oxygen from your lungs to the rest of your body. This medicine may be used for other purposes; ask your health care provider or pharmacist if you have questions. COMMON BRAND NAME(S): Ferrlecit, Nulecit What should I tell my care team before I take this medication? They need to know if you have any of the following conditions: Anemia that is not from iron deficiency High levels of iron in the blood An unusual or allergic reaction to iron, other medications, foods, dyes, or preservatives Pregnant or are trying to become pregnant Breast-feeding How should I use this medication? This medication is injected into a vein. It is given by your care team in a hospital or clinic setting. Talk to your care team about the use of this medication in children. While it may be prescribed for children as young as 6 years for selected conditions, precautions do apply. Overdosage: If you think you have taken too much of this medicine contact a poison control center or emergency room at once. NOTE: This medicine is only for you. Do not share this medicine with others. What if I miss a dose? It is important not to miss your dose. Call your care team if you are unable to keep an appointment. What may interact with this medication? Do not take this medication with any of the following: Deferasirox Deferoxamine Dimercaprol This medication may also interact with the following: Other iron products This list may not describe all possible interactions. Give your health care provider a list of all the medicines, herbs, non-prescription drugs, or dietary supplements you use. Also tell them if you smoke, drink  alcohol, or use illegal drugs. Some items may interact with your medicine. What should I watch for while using this medication? Your condition will be monitored carefully while you are receiving this medication. Visit your care team for regular checks on your progress. You may need blood work while you are taking this medication. What side effects may I notice from receiving this medication? Side effects that you should report to your care team as soon as possible: Allergic reactions--skin rash, itching, hives, swelling of the face, lips, tongue, or throat Low blood pressure--dizziness, feeling faint or lightheaded, blurry vision Shortness of breath Side effects that usually do not require medical attention (report to your care team if they continue or are bothersome): Flushing Headache Joint pain Muscle pain Nausea Pain, redness, or irritation at injection site This list may not describe all possible side effects. Call your doctor for medical advice about side effects. You may report side effects to FDA at 1-800-FDA-1088. Where should I keep my medication? This medication is given in a hospital or clinic and will not be stored at home. NOTE: This sheet is a summary. It may not cover all possible information. If you have questions about this medicine, talk to your doctor, pharmacist, or health care provider.  2024 Elsevier/Gold Standard (2020-06-17 00:00:00)

## 2023-10-25 ENCOUNTER — Inpatient Hospital Stay

## 2023-10-25 VITALS — BP 106/76 | HR 92 | Temp 98.1°F | Resp 18

## 2023-10-25 DIAGNOSIS — D509 Iron deficiency anemia, unspecified: Secondary | ICD-10-CM | POA: Diagnosis not present

## 2023-10-25 MED ORDER — METHYLPREDNISOLONE SODIUM SUCC 125 MG IJ SOLR
125.0000 mg | Freq: Once | INTRAMUSCULAR | Status: AC
  Administered 2023-10-25: 125 mg via INTRAVENOUS
  Filled 2023-10-25: qty 2

## 2023-10-25 MED ORDER — DIPHENHYDRAMINE HCL 25 MG PO CAPS
50.0000 mg | ORAL_CAPSULE | Freq: Once | ORAL | Status: AC
  Administered 2023-10-25: 50 mg via ORAL
  Filled 2023-10-25: qty 2

## 2023-10-25 MED ORDER — ACETAMINOPHEN 325 MG PO TABS
650.0000 mg | ORAL_TABLET | Freq: Once | ORAL | Status: AC
  Administered 2023-10-25: 650 mg via ORAL
  Filled 2023-10-25: qty 2

## 2023-10-25 MED ORDER — SODIUM CHLORIDE 0.9 % IV SOLN
125.0000 mg | Freq: Once | INTRAVENOUS | Status: AC
  Administered 2023-10-25: 125 mg via INTRAVENOUS
  Filled 2023-10-25: qty 10

## 2023-10-25 MED ORDER — SODIUM CHLORIDE 0.9 % IV SOLN: Freq: Once | INTRAVENOUS | Status: AC

## 2023-10-25 MED ORDER — LORATADINE 10 MG PO TABS
10.0000 mg | ORAL_TABLET | Freq: Once | ORAL | Status: AC
  Administered 2023-10-25: 10 mg via ORAL
  Filled 2023-10-25: qty 1

## 2023-10-25 MED ORDER — FAMOTIDINE IN NACL 20-0.9 MG/50ML-% IV SOLN
20.0000 mg | Freq: Once | INTRAVENOUS | Status: AC
  Administered 2023-10-25: 20 mg via INTRAVENOUS
  Filled 2023-10-25: qty 50

## 2023-10-25 NOTE — Patient Instructions (Signed)
Iron Deficiency Anemia, Adult  Iron deficiency anemia is when you do not have enough red blood cells or hemoglobin in your blood. This happens because you have too little iron in your body. Hemoglobin carries oxygen to parts of the body. Anemia can cause your body to not get enough oxygen. What are the causes? Not eating enough foods that have iron in them. The body not being able to take in iron well. Blood loss. What increases the risk? Having menstrual periods. Being pregnant. What are the signs or symptoms? Pale skin, lips, and nails. Weakness, dizziness, and getting tired easily. Feeling like you cannot breathe well when moving (shortness of breath). Cold hands and feet. Mild anemia may not cause any symptoms. How is this treated? This condition is treated by finding out why you do not have enough iron and then getting more iron. It may include: Adding foods to your diet that have a lot of iron. Taking iron pills (supplements). If you are pregnant or breastfeeding, you may need to take extra iron. Your diet often does not provide the amount of iron that you need. Getting more vitamin C in your diet. Vitamin C helps your body take in iron. You may need to take iron pills with a glass of orange juice or vitamin C pills. Medicines to make heavy menstrual periods lighter. Surgery or testing procedures to find what is causing the condition. You may need blood tests to see if treatment is working. If the treatment does not seem to be working, you may need more tests. Follow these instructions at home: Medicines Take over-the-counter and prescription medicines only as told by your doctor. This includes iron pills and vitamins. Taking them as told is important because too much iron can be harmful. Take iron pills when your stomach is empty. If you cannot handle this, take them with food. Do not drink milk or take antacids at the same time as your iron pills. Iron pills may turn your poop  (stool)black. If you cannot handle taking iron pills by mouth, ask your doctor about getting iron through: An IV tube. A shot (injection) into a muscle. Eating and drinking Talk with your doctor before changing the foods you eat. Your doctor may tell you to eat foods that have a lot of iron, such as: Liver. Low-fat (lean) beef. Breads and cereals that have iron added to them. Eggs. Dried fruit. Dark green, leafy vegetables. Eat fresh fruits and vegetables that are high in vitamin C. They help your body use iron. Foods with a lot of vitamin C include: Oranges. Peppers. Tomatoes. Mangoes. Managing constipation If you are taking iron pills, they may cause trouble pooping (constipation). To prevent or treat this, you may need to: Drink enough fluid to keep your pee (urine) pale yellow. Take over-the-counter or prescription medicines. Eat foods that are high in fiber. These include beans, whole grains, and fresh fruits and vegetables. Limit foods that are high in fat and sugar. These include fried or sweet foods. General instructions Return to your normal activities when your doctor says that it is safe. Keep all follow-up visits. Contact a doctor if: You feel like you may vomit (nauseous), or you vomit. You feel weak. You get light-headed when getting up from sitting or lying down. You are sweating for no reason. You have trouble pooping. You have worse breathing with physical activity. You have heaviness in your chest. Get help right away if: You faint. If this happens, do not drive yourself  to the hospital. You have a fast heartbeat, or a heartbeat that does not feel regular. Summary Iron deficiency anemia happens when you have too little iron in your body. This condition is treated by finding out why you do not have enough iron in your body and then getting more iron. Take over-the-counter and prescription medicines only as told by your doctor. Eat fresh fruits and vegetables  that are high in vitamin C. Contact a doctor if you have trouble pooping or feel weak. This information is not intended to replace advice given to you by your health care provider. Make sure you discuss any questions you have with your health care provider. Document Revised: 03/02/2021 Document Reviewed: 03/02/2021 Elsevier Patient Education  2024 [employer].

## 2023-11-01 ENCOUNTER — Inpatient Hospital Stay

## 2023-11-01 VITALS — BP 115/72 | HR 91 | Temp 98.1°F | Resp 18

## 2023-11-01 DIAGNOSIS — D509 Iron deficiency anemia, unspecified: Secondary | ICD-10-CM | POA: Diagnosis not present

## 2023-11-01 MED ORDER — DIPHENHYDRAMINE HCL 25 MG PO CAPS
50.0000 mg | ORAL_CAPSULE | Freq: Once | ORAL | Status: AC
  Administered 2023-11-01: 50 mg via ORAL
  Filled 2023-11-01: qty 2

## 2023-11-01 MED ORDER — SODIUM CHLORIDE 0.9 % IV SOLN: INTRAVENOUS | Status: DC

## 2023-11-01 MED ORDER — METHYLPREDNISOLONE SODIUM SUCC 125 MG IJ SOLR
125.0000 mg | Freq: Once | INTRAMUSCULAR | Status: AC
  Administered 2023-11-01: 125 mg via INTRAVENOUS
  Filled 2023-11-01: qty 2

## 2023-11-01 MED ORDER — ACETAMINOPHEN 325 MG PO TABS
650.0000 mg | ORAL_TABLET | Freq: Once | ORAL | Status: AC
  Administered 2023-11-01: 650 mg via ORAL
  Filled 2023-11-01: qty 2

## 2023-11-01 MED ORDER — SODIUM CHLORIDE 0.9 % IV SOLN
125.0000 mg | Freq: Once | INTRAVENOUS | Status: AC
  Administered 2023-11-01: 125 mg via INTRAVENOUS
  Filled 2023-11-01: qty 10

## 2023-11-01 MED ORDER — FAMOTIDINE IN NACL 20-0.9 MG/50ML-% IV SOLN
20.0000 mg | Freq: Once | INTRAVENOUS | Status: AC
  Administered 2023-11-01: 20 mg via INTRAVENOUS
  Filled 2023-11-01: qty 50

## 2023-11-01 MED ORDER — LORATADINE 10 MG PO TABS: 10.0000 mg | ORAL_TABLET | Freq: Once | ORAL | Status: DC

## 2023-11-01 NOTE — Progress Notes (Signed)
 Pt. Stated that she has a sick child at home. Has been doing fine and declined to wait 30 mins post. Stable on d/c.

## 2023-11-13 ENCOUNTER — Inpatient Hospital Stay: Attending: Medical Oncology

## 2023-11-13 ENCOUNTER — Encounter: Payer: Self-pay | Admitting: Medical Oncology

## 2023-11-13 ENCOUNTER — Inpatient Hospital Stay (HOSPITAL_BASED_OUTPATIENT_CLINIC_OR_DEPARTMENT_OTHER): Admitting: Medical Oncology

## 2023-11-13 VITALS — BP 106/71 | HR 72 | Temp 98.2°F | Resp 18 | Ht 69.0 in | Wt 145.0 lb

## 2023-11-13 DIAGNOSIS — Z79899 Other long term (current) drug therapy: Secondary | ICD-10-CM | POA: Insufficient documentation

## 2023-11-13 DIAGNOSIS — D649 Anemia, unspecified: Secondary | ICD-10-CM

## 2023-11-13 DIAGNOSIS — R161 Splenomegaly, not elsewhere classified: Secondary | ICD-10-CM | POA: Diagnosis not present

## 2023-11-13 DIAGNOSIS — D509 Iron deficiency anemia, unspecified: Secondary | ICD-10-CM | POA: Diagnosis not present

## 2023-11-13 DIAGNOSIS — D709 Neutropenia, unspecified: Secondary | ICD-10-CM

## 2023-11-13 DIAGNOSIS — Z7982 Long term (current) use of aspirin: Secondary | ICD-10-CM | POA: Insufficient documentation

## 2023-11-13 DIAGNOSIS — N920 Excessive and frequent menstruation with regular cycle: Secondary | ICD-10-CM | POA: Insufficient documentation

## 2023-11-13 LAB — CBC WITH DIFFERENTIAL (CANCER CENTER ONLY)
Abs Immature Granulocytes: 0 K/uL (ref 0.00–0.07)
Basophils Absolute: 0 K/uL (ref 0.0–0.1)
Basophils Relative: 1 %
Eosinophils Absolute: 0 K/uL (ref 0.0–0.5)
Eosinophils Relative: 0 %
HCT: 31.8 % — ABNORMAL LOW (ref 36.0–46.0)
Hemoglobin: 10.5 g/dL — ABNORMAL LOW (ref 12.0–15.0)
Immature Granulocytes: 0 %
Lymphocytes Relative: 53 %
Lymphs Abs: 1.7 K/uL (ref 0.7–4.0)
MCH: 25.5 pg — ABNORMAL LOW (ref 26.0–34.0)
MCHC: 33 g/dL (ref 30.0–36.0)
MCV: 77.4 fL — ABNORMAL LOW (ref 80.0–100.0)
Monocytes Absolute: 0.4 K/uL (ref 0.1–1.0)
Monocytes Relative: 12 %
Neutro Abs: 1.1 K/uL — ABNORMAL LOW (ref 1.7–7.7)
Neutrophils Relative %: 34 %
Platelet Count: 207 K/uL (ref 150–400)
RBC: 4.11 MIL/uL (ref 3.87–5.11)
RDW: 20.6 % — ABNORMAL HIGH (ref 11.5–15.5)
WBC Count: 3.2 K/uL — ABNORMAL LOW (ref 4.0–10.5)
nRBC: 0 % (ref 0.0–0.2)

## 2023-11-13 LAB — CMP (CANCER CENTER ONLY)
ALT: 10 U/L (ref 0–44)
AST: 14 U/L — ABNORMAL LOW (ref 15–41)
Albumin: 4.2 g/dL (ref 3.5–5.0)
Alkaline Phosphatase: 50 U/L (ref 38–126)
Anion gap: 10 (ref 5–15)
BUN: 8 mg/dL (ref 6–20)
CO2: 23 mmol/L (ref 22–32)
Calcium: 9 mg/dL (ref 8.9–10.3)
Chloride: 109 mmol/L (ref 98–111)
Creatinine: 0.68 mg/dL (ref 0.44–1.00)
GFR, Estimated: >60 mL/min (ref >60)
Glucose, Bld: 86 mg/dL (ref 70–99)
Potassium: 4.7 mmol/L (ref 3.5–5.1)
Sodium: 142 mmol/L (ref 135–145)
Total Bilirubin: 0.4 mg/dL (ref 0.0–1.2)
Total Protein: 6.7 g/dL (ref 6.5–8.1)

## 2023-11-13 LAB — IRON AND IRON BINDING CAPACITY (CC-WL,HP ONLY)
Iron: 115 ug/dL (ref 28–170)
Saturation Ratios: 39 % — ABNORMAL HIGH (ref 10.4–31.8)
TIBC: 298 ug/dL (ref 250–450)
UIBC: 183 ug/dL

## 2023-11-13 LAB — RETIC PANEL
Immature Retic Fract: 19.3 % — ABNORMAL HIGH (ref 2.3–15.9)
RBC.: 4.05 MIL/uL (ref 3.87–5.11)
Retic Count, Absolute: 40.1 K/uL (ref 19.0–186.0)
Retic Ct Pct: 1 % (ref 0.4–3.1)
Reticulocyte Hemoglobin: 27.4 pg — ABNORMAL LOW (ref >27.9)

## 2023-11-13 LAB — LACTATE DEHYDROGENASE: LDH: 174 U/L (ref 98–192)

## 2023-11-13 LAB — SAVE SMEAR(SSMR), FOR PROVIDER SLIDE REVIEW

## 2023-11-13 LAB — FERRITIN: Ferritin: 108 ng/mL (ref 11–307)

## 2023-11-13 NOTE — Progress Notes (Signed)
 Hematology and Oncology Follow Up Visit  April Rodgers [id number] 1985/02/22 38 y.o. 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   History of chorioamnionitis    03-10-2015   History of fetal loss    03-10-2015  non-viable fetal demise twins at [redacted]w[redacted]d due to chorioamnionitis infection with PPROM   Hx of varicella    Hydrosalpinx    RIGHT   Migraine    Normal exercise tolerance test results    03-22-2006   Obstruction of fallopian tube    bilateral occlusion   Scoliosis     Principle Diagnosis:  Iron  Deficiency Anemia  Current Therapy:   IV Ferrlecit with premedications   PriorTherapy:   Oral iron - ineffective IV Venofer - d/c due to swelling of hands/feet    Interim History:  April Rodgers is back for follow-up for her anemia and abnormal CBC.     She was previously seen by Dr. Lonn. She was given IV Venofer  on 04/16/2022 and 04/23/2022. After both infusions she had swelling of hands and feet. Reaction responded well to benadryl . She has not had any supplemental iron  since. Former oral iron  caused significant constipation.    At the time of her referral back to us , her most recent labs showed a WBC of 3.5, Hgb of 10.8, MCV of 77.9, ANC of 1.3, Neutro relative 37.4%, lymph relative 47.5%, monocytes relative 13.6%, platelets of 229. Iron  71, TIBC 288, iron  saturation of 25%, ferritin 12.    She has had IDA since around 2009. She has also had an SPEP which was normal on 02/14/2022. She does report being suggested to have a BMB done when she was pregnant due to abnormal CBC results. She did not have this performed but requests this today.    She does have a history of Asherman's Syndrome. She was diagnosed with this around 2017. She does have heavy  menstrual cycles. She had endometrial surgery in 2017 which has helped lighten her menstrual cycles as bit. Menstrual cycles last about 3-5 days. Flow is considered super heavy for the first 2 days then lightening. Menstrual cycles are regular in nature. No other known bleeding  She denies unintentional weight loss or night sweats.  Wt Readings from Last 3 Encounters:  11/13/23 145 lb (65.8 kg)  08/13/23 143 lb 0.6 oz (64.9 kg)  07/09/23 146 lb (66.2 kg)     Medications:   Current Outpatient Medications:    acetaminophen  (TYLENOL ) 500 MG tablet, Take 1,000 mg by mouth every 6 (six) hours as needed for moderate pain or fever., Disp: , Rfl:    aspirin EC 81 MG tablet, Take 81 mg by mouth daily. Swallow whole. (Patient taking differently: Take 81 mg by mouth daily. Swallow whole.), Disp: , Rfl:    fluticasone  (FLONASE ) 50 MCG/ACT nasal spray, Place 2 sprays into both nostrils daily., Disp: 16 g, Rfl: 1   ibuprofen  (ADVIL ) 600 MG tablet, Take 1 tablet (600 mg total) by mouth every 8 (eight) hours as needed., Disp: 30 tablet, Rfl: 0   olopatadine  (PATANOL) 0.1 % ophthalmic solution, Place 1 drop into both eyes 2 (two) times daily., Disp: 5 mL, Rfl: 4   RYALTRIS  665-25 MCG/ACT SUSP, 2 sprays  each nostril twice a day as needed for runny or stuffy nose (Patient taking differently: 2 sprays each nostril twice a day as needed for runny or stuffy nose), Disp: 29 g, Rfl: 5   fluconazole  (DIFLUCAN ) 150 MG tablet, Take 1 tablet (150 mg total) by mouth daily. (Patient not taking: Reported on 11/13/2023), Disp: 1 tablet, Rfl: 0   sertraline (ZOLOFT) 50 MG tablet, Take 50 mg by mouth daily. (Patient not taking: Reported on 11/13/2023), Disp: , Rfl:    Vitamin D , Ergocalciferol , (DRISDOL ) 1.25 MG (50000 UNIT) CAPS capsule, TAKE 1 CAPSULE (50,000 UNITS TOTAL) BY MOUTH EVERY 7 (SEVEN) DAYS (Patient not taking: Reported on 11/13/2023), Disp: 4 capsule, Rfl: 1  Allergies:  Allergies  Allergen Reactions   Iron   Sucrose Swelling and Other (See Comments)    Discomfort to bilateral feet, unilateral swelling of hand.   Penicillins Hives and Rash    Has patient had a PCN reaction causing immediate rash, facial/tongue/throat swelling, SOB or lightheadedness with hypotension: Yes Has patient had a PCN reaction causing severe rash involving mucus membranes or skin necrosis: No Has patient had a PCN reaction that required hospitalization No Has patient had a PCN reaction occurring within the last 10 years: No If all of the above answers are NO, then may proceed with Cephalosporin use.     Past Medical History, Surgical history, Social history, and Family History were reviewed and updated.  Review of Systems: Review of Systems  Constitutional: Negative.   HENT:  Negative.    Eyes: Negative.   Respiratory: Negative.    Cardiovascular: Negative.   Gastrointestinal: Negative.   Endocrine: Negative.   Genitourinary: Negative.    Musculoskeletal: Negative.   Skin: Negative.   Neurological: Negative.   Hematological: Negative.   Psychiatric/Behavioral: Negative.       Physical Exam:  height is 5' 9 (1.753 m) and weight is 145 lb (65.8 kg). Her oral temperature is 98.2 F (36.8 C). Her blood pressure is 106/71 and her pulse is 72. Her respiration is 18 and oxygen saturation is 97%.   Physical Exam General: NAD Cardiovascular: regular rate and rhythm Pulmonary: clear ant fields Abdomen: soft, nontender, + bowel sounds GU: no suprapubic tenderness Extremities: no edema, no joint deformities Skin: no rashes Neurological: Weakness but otherwise nonfocal   Lab Results  Component Value Date   WBC 3.2 (L) 11/13/2023   HGB 10.5 (L) 11/13/2023   HCT 31.8 (L) 11/13/2023   MCV 77.4 (L) 11/13/2023   PLT 207 11/13/2023     Chemistry      Component Value Date/Time   NA 142 11/13/2023 1110   NA 141 03/08/2022 1437   K 4.7 11/13/2023 1110   CL 109 11/13/2023 1110   CO2 23 11/13/2023 1110    BUN 8 11/13/2023 1110   BUN 5 (L) 03/08/2022 1437   CREATININE 0.68 11/13/2023 1110   CREATININE 0.72 02/14/2022 1116      Component Value Date/Time   CALCIUM  9.0 11/13/2023 1110   ALKPHOS 50 11/13/2023 1110   AST 14 (L) 11/13/2023 1110   ALT 10 11/13/2023 1110   BILITOT 0.4 11/13/2023 1110     No diagnosis found. Assessment and Plan- Patient is a 38 y.o. female who was referred to us  for IDA and abnormal CBC findings. She has had a normal SPEP. Her IDA suspected to be secondary to heavy menses.   We reviewed her labs today which show a WBC of 3.2, ANC of 1.1, Microcytic anemia with  a Hgb of 10.5. She had her last iron  infusion about a week and a half ago but counts do not seem to be improving. If iron  levels are lower than target I would suggest switching to Monoferric which I discussed with patient.   We also discussed her cbc changes and past recommendation of a bone marrow biopsy. After discussing this further she strongly wishes to per sue this test. As this has been recommended in the past and given her continued CBC abnormalities we are in agreement to proceed forward.    Disposition: BMB order placed RTC 2 months APP, labs    Lauraine Dais PA-C 10/8/202512:36 PM

## 2023-11-16 LAB — HGB SOLU + RFLX FRAC: Hemoglobin (HGB) Solubility: NEGATIVE

## 2023-11-29 ENCOUNTER — Ambulatory Visit (INDEPENDENT_AMBULATORY_CARE_PROVIDER_SITE_OTHER): Admitting: Medical

## 2023-11-29 ENCOUNTER — Ambulatory Visit: Payer: Self-pay | Admitting: Medical

## 2023-11-29 VITALS — BP 118/80 | HR 82 | Temp 98.1°F | Resp 16 | Ht 69.0 in | Wt 147.8 lb

## 2023-11-29 DIAGNOSIS — R5383 Other fatigue: Secondary | ICD-10-CM

## 2023-11-29 DIAGNOSIS — R350 Frequency of micturition: Secondary | ICD-10-CM

## 2023-11-29 DIAGNOSIS — E559 Vitamin D deficiency, unspecified: Secondary | ICD-10-CM

## 2023-11-29 DIAGNOSIS — R739 Hyperglycemia, unspecified: Secondary | ICD-10-CM | POA: Diagnosis not present

## 2023-11-29 DIAGNOSIS — D649 Anemia, unspecified: Secondary | ICD-10-CM

## 2023-11-29 LAB — POCT URINALYSIS DIPSTICK
Bilirubin, UA: NEGATIVE
Blood, UA: NEGATIVE
Glucose, UA: NEGATIVE
Ketones, UA: NEGATIVE
Nitrite, UA: NEGATIVE
Protein, UA: NEGATIVE
Spec Grav, UA: <=1.005 — AB
Urobilinogen, UA: 0.2 U/dL
pH, UA: 7

## 2023-11-29 LAB — COMPREHENSIVE METABOLIC PANEL WITH GFR
ALT: 8 U/L (ref 0–35)
AST: 10 U/L (ref 0–37)
Albumin: 4.3 g/dL (ref 3.5–5.2)
Alkaline Phosphatase: 53 U/L (ref 39–117)
BUN: 7 mg/dL (ref 6–23)
CO2: 24 meq/L (ref 19–32)
Calcium: 8.7 mg/dL (ref 8.4–10.5)
Chloride: 106 meq/L (ref 96–112)
Creatinine, Ser: 0.68 mg/dL (ref 0.40–1.20)
GFR: 111.34 mL/min (ref >60.00)
Glucose, Bld: 81 mg/dL (ref 70–99)
Potassium: 4.1 meq/L (ref 3.5–5.1)
Sodium: 137 meq/L (ref 135–145)
Total Bilirubin: 0.6 mg/dL (ref 0.2–1.2)
Total Protein: 6.7 g/dL (ref 6.0–8.3)

## 2023-11-29 LAB — CBC WITH DIFFERENTIAL/PLATELET
Basophils Absolute: 0 K/uL (ref 0.0–0.1)
Basophils Relative: 0.8 % (ref 0.0–3.0)
Eosinophils Absolute: 0 K/uL (ref 0.0–0.7)
Eosinophils Relative: 1 % (ref 0.0–5.0)
HCT: 35.2 % — ABNORMAL LOW (ref 36.0–46.0)
Hemoglobin: 11.3 g/dL — ABNORMAL LOW (ref 12.0–15.0)
Lymphocytes Relative: 54.3 % — ABNORMAL HIGH (ref 12.0–46.0)
Lymphs Abs: 1.7 K/uL (ref 0.7–4.0)
MCHC: 31.9 g/dL (ref 30.0–36.0)
MCV: 79.9 fl (ref 78.0–100.0)
Monocytes Absolute: 0.4 K/uL (ref 0.1–1.0)
Monocytes Relative: 11.5 % (ref 3.0–12.0)
Neutro Abs: 1 K/uL — ABNORMAL LOW (ref 1.4–7.7)
Neutrophils Relative %: 32.4 % — ABNORMAL LOW (ref 43.0–77.0)
Platelets: 215 K/uL (ref 150.0–400.0)
RBC: 4.41 Mil/uL (ref 3.87–5.11)
RDW: 21.1 % — ABNORMAL HIGH (ref 11.5–15.5)
WBC: 3.1 K/uL — ABNORMAL LOW (ref 4.0–10.5)

## 2023-11-29 LAB — HEMOGLOBIN A1C: Hgb A1c MFr Bld: 5.4 % (ref 4.6–6.5)

## 2023-11-29 LAB — VITAMIN B12: Vitamin B-12: 479 pg/mL (ref 211–911)

## 2023-11-29 LAB — T4, FREE: Free T4: 0.81 ng/dL (ref 0.60–1.60)

## 2023-11-29 LAB — VITAMIN D 25 HYDROXY (VIT D DEFICIENCY, FRACTURES): VITD: 8.62 ng/mL — ABNORMAL LOW (ref 30.00–100.00)

## 2023-11-29 LAB — TSH: TSH: 1.54 u[IU]/mL (ref 0.35–5.50)

## 2023-11-29 MED ORDER — NITROFURANTOIN MONOHYD MACRO 100 MG PO CAPS: 100.0000 mg | ORAL_CAPSULE | Freq: Two times a day (BID) | ORAL | 0 refills | Status: DC

## 2023-11-29 MED ORDER — MONTELUKAST SODIUM 10 MG PO TABS: 10.0000 mg | ORAL_TABLET | Freq: Every day | ORAL | 0 refills | Status: DC

## 2023-11-29 NOTE — Patient Instructions (Signed)
 Urinary tract infection Suspected UTI due to frequent urination, kidney area tenderness, and leukocytes in urine. No dysuria or bladder pressure. Penicillin allergy noted. - Prescribe Macrobid 100 mg twice daily for 7 days. - Perform urine culture.  Fatigue and anemia Persistent fatigue despite iron  infusions. Bone marrow biopsy scheduled. Mid-range B12, low vitamin D , and thyroid  function not checked in over a year. Hematologist plans CBC and iron  panel in December. Increased fatigue requires further evaluation. - Order B12 level. - Order comprehensive metabolic panel. - Order thyroid  function tests. - Order vitamin D  level. - Order CBC and iron  panel. - Send lab results to hematologist.  Vitamin D  deficiency Low vitamin D  levels may contribute to fatigue. - Order vitamin D  level.  Allergic rhinitis Postnasal drainage likely from allergies. No throat swelling or discharge. On Zyrtec  for two weeks. - Prescribe montelukast 10 mg at night. - Continue Zyrtec .  follow up date to be determined after lab review

## 2023-11-29 NOTE — Progress Notes (Signed)
 Subjective:    Patient ID: April Rodgers, female    DOB: 1985-10-08, 38 y.o.   MRN: [id number]  HPI  April Rodgers is a 38 year old female with anemia who presents with persistent fatigue and back pain.  She experiences ongoing back pain and persistent fatigue despite receiving four iron  infusions, which did not improve her energy levels. A bone marrow biopsy is scheduled for January 13th.  She has a history of low iron  levels and is scheduled for regular labs, including a CBC and iron  panel, in December. Her fatigue has worsened, prompting additional lab work.  She experiences frequent urination, which has increased over the past three weeks, but denies any burning sensation or bladder pressure. Her last menstrual cycle was on October 7th and was normal.  She reports postnasal drainage and has been taking Zyrtec  for two weeks. She has tried Benadryl  and Claritin  in the past but has not used montelukast before.         Review of Systems  Constitutional:  Positive for fatigue. Negative for chills and fever.  HENT:  Positive for congestion and postnasal drip. Negative for sneezing.   Respiratory:  Negative for choking, shortness of breath and wheezing.   Cardiovascular:  Negative for chest pain and palpitations.  Gastrointestinal:  Negative for abdominal pain, constipation and diarrhea.  Genitourinary:  Positive for frequency.  Musculoskeletal:  Negative for back pain and myalgias.  Skin:  Negative for rash.  Neurological:  Negative for dizziness, syncope, weakness and light-headedness.  Psychiatric/Behavioral:  Negative for behavioral problems and decreased concentration. The patient is not nervous/anxious.     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    History of chorioamnionitis    03-10-2015   History of fetal loss    03-10-2015  non-viable fetal demise twins at [redacted]w[redacted]d due to chorioamnionitis infection with PPROM   Hx of varicella    Hydrosalpinx    RIGHT   Migraine    Normal exercise tolerance test results    03-22-2006   Obstruction of fallopian tube    bilateral occlusion   Scoliosis      Social History   Socioeconomic History   Marital status: Married    Spouse name: Not on file   Number of children: Not on file   Years of education: Not on file   Highest education level: Associate degree: academic program  Occupational History   Not on file  Tobacco Use   Smoking status: Never    Passive exposure: Never   Smokeless tobacco: Never  Vaping Use   Vaping status: Never Used  Substance and Sexual Activity   Alcohol use: No   Drug use: No   Sexual activity: Yes  Other Topics Concern   Not on file  Social History Narrative   Not on file   Social Drivers of Health   Financial Resource Strain: Patient Declined (11/29/2023)   Overall Financial Resource Strain (CARDIA)    Difficulty of Paying Living Expenses: Patient declined  Food Insecurity: Patient Declined (11/29/2023)   Hunger Vital Sign    Worried About Running Out of Food in the Last Year: Patient declined    Barista  in the Last Year: Patient declined  Transportation Needs: No Transportation Needs (11/29/2023)   PRAPARE - Administrator, Civil Service (Medical): No    Lack of Transportation (Non-Medical): No  Physical Activity: Insufficiently Active (11/29/2023)   Exercise Vital Sign    Days of Exercise per Week: 3 days    Minutes of Exercise per Session: 20 min  Stress: Stress Concern Present (11/29/2023)   [employer] of Occupational Health - Occupational Stress Questionnaire    Feeling of Stress: To some extent  Social Connections: Socially Isolated (11/29/2023)   Social Connection and Isolation Panel    Frequency of  Communication with Friends and Family: More than three times a week    Frequency of Social Gatherings with Friends and Family: Patient declined    Attends Religious Services: Patient declined    Database administrator or Organizations: No    Attends Engineer, structural: Not on file    Marital Status: Separated  Intimate Partner Violence: Not At Risk (08/13/2023)   Humiliation, Afraid, Rape, and Kick questionnaire    Fear of Current or Ex-Partner: No    Emotionally Abused: No    Physically Abused: No    Sexually Abused: No    Past Surgical History:  Procedure Laterality Date   DILATION AND CURETTAGE OF UTERUS  2006   HYSTEROSCOPY WITH D & C N/A 06/28/2015   Procedure: SUCTION DILATATION AND CURETTAGE /HYSTEROSCOPY, ;  Surgeon: Cynthia Loss, MD;  Location: [facility];  Service: Gynecology;  Laterality: N/A;   LAPAROSCOPY Left 06/28/2015   Procedure: LAPAROSCOPY LYSIS OF ADHESIONS, RIGHT SALPINGECTOMY WITH NOVY CATHERIZATION OF LEFT TUBE,EXCISION OF ENDOMETROSIS;  Surgeon: Cynthia Loss, MD;  Location:  SURGERY CENTER;  Service: Gynecology;  Laterality: Left;   TONSILLECTOMY  2012   WISDOM TOOTH EXTRACTION  2016    Family History  Problem Relation Age of Onset   Anemia Mother    Allergic rhinitis Daughter    Eczema Daughter    Allergies Daughter    Allergic rhinitis Son    Allergies Son    Allergies Son    Pneumonia Son     Allergies  Allergen Reactions   Iron  Sucrose Swelling and Other (See Comments)    Discomfort to bilateral feet, unilateral swelling of hand.   Penicillins Hives and Rash    Has patient had a PCN reaction causing immediate rash, facial/tongue/throat swelling, SOB or lightheadedness with hypotension: Yes Has patient had a PCN reaction causing severe rash involving mucus membranes or skin necrosis: No Has patient had a PCN reaction that required hospitalization No Has patient had a PCN reaction occurring within the  last 10 years: No If all of the above answers are NO, then may proceed with Cephalosporin use.     Current Outpatient Medications on File Prior to Visit  Medication Sig Dispense Refill   acetaminophen  (TYLENOL ) 500 MG tablet Take 1,000 mg by mouth every 6 (six) hours as needed for moderate pain or fever.     aspirin EC 81 MG tablet Take 81 mg by mouth daily. Swallow whole. (Patient taking differently: Take 81 mg by mouth daily. Swallow whole.)     fluticasone  (FLONASE ) 50 MCG/ACT nasal spray Place 2 sprays into both nostrils daily. 16 g 1   ibuprofen  (ADVIL ) 600 MG tablet Take 1 tablet (600 mg total) by mouth every 8 (eight) hours as needed. 30 tablet 0   olopatadine  (PATANOL) 0.1 % ophthalmic solution Place 1 drop  into both eyes 2 (two) times daily. 5 mL 4   RYALTRIS  665-25 MCG/ACT SUSP 2 sprays each nostril twice a day as needed for runny or stuffy nose (Patient taking differently: 2 sprays each nostril twice a day as needed for runny or stuffy nose) 29 g 5   sertraline (ZOLOFT) 50 MG tablet Take 50 mg by mouth daily. (Patient not taking: Reported on 11/29/2023)     No current facility-administered medications on file prior to visit.    BP 118/80   Pulse 82   Temp 98.1 F (36.7 C) (Oral)   Resp 16   Ht 5' 9 (1.753 m)   Wt 147 lb 12.8 oz (67 kg)   LMP 11/12/2023 (Exact Date)   SpO2 98%   BMI 21.83 kg/m           Objective:   Physical Exam  General Mental Status- Alert. General Appearance- Not in acute distress.   Skin General: Color- Normal Color. Moisture- Normal Moisture.  Neck No JVD.  Chest and Lung Exam Auscultation: Breath Sounds:-CTA  Cardiovascular Auscultation:Rythm- RRR Murmurs & Other Heart Sounds:Auscultation of the heart reveals- No Murmurs.  Abdomen Inspection:-Inspeection Normal. Palpation/Percussion:Note:No mass. Palpation and Percussion of the abdomen reveal- Non Tender, Non Distended + BS, no rebound or guarding.(No suprapubic  tenderness)   Neurologic Cranial Nerve exam:- CN III-XII intact(No nystagmus), symmetric smile. Strength:- 5/5 equal and symmetric strength both upper and lower extremities.   Heent- no sinus pressure. Posterior pharynx normal. No dc. No hypertrophy. Boggy turvinates.  Back- faint bilateral cva tender.       Assessment & Plan:   Urinary tract infection Suspected UTI due to frequent urination, kidney area tenderness, and leukocytes in urine. No dysuria or bladder pressure. Penicillin allergy noted. - Prescribe Macrobid 100 mg twice daily for 7 days. - Perform urine culture.  Fatigue and anemia Persistent fatigue despite iron  infusions. Bone marrow biopsy scheduled. Mid-range B12, low vitamin D , and thyroid  function not checked in over a year. Hematologist plans CBC and iron  panel in December. Increased fatigue requires further evaluation. - Order B12 level. - Order comprehensive metabolic panel. - Order thyroid  function tests. - Order vitamin D  level. - Order CBC and iron  panel. - Send lab results to hematologist.  Vitamin D  deficiency Low vitamin D  levels may contribute to fatigue. - Order vitamin D  level.  Allergic rhinitis Postnasal drainage likely from allergies. No throat swelling or discharge. On Zyrtec  for two weeks. - Prescribe montelukast 10 mg at night. - Continue Zyrtec .  follow up date to be determined after lab review  Chessica Audia, PA-C

## 2023-11-30 ENCOUNTER — Encounter: Payer: Self-pay | Admitting: Medical

## 2023-11-30 LAB — URINE CULTURE
MICRO NUMBER:: 17144450
Result:: NO GROWTH
SPECIMEN QUALITY:: ADEQUATE

## 2023-11-30 LAB — IRON,TIBC AND FERRITIN PANEL
%SAT: 62 % — ABNORMAL HIGH (ref 16–45)
Ferritin: 62 ng/mL (ref 16–154)
Iron: 162 ug/dL (ref 40–190)
TIBC: 260 ug/dL (ref 250–450)

## 2023-11-30 MED ORDER — VITAMIN D (ERGOCALCIFEROL) 1.25 MG (50000 UNIT) PO CAPS: 50000.0000 [IU] | ORAL_CAPSULE | ORAL | 0 refills | Status: AC

## 2023-11-30 NOTE — Addendum Note (Signed)
 Addended by: DORINA DALLAS HERO on: 11/30/2023 09:23 AM   Modules accepted: Orders

## 2023-12-01 MED ORDER — FLUCONAZOLE 150 MG PO TABS: 150.0000 mg | ORAL_TABLET | Freq: Every day | ORAL | 0 refills | Status: DC

## 2023-12-01 NOTE — Addendum Note (Signed)
 Addended by: DORINA DALLAS HERO on: 12/01/2023 09:24 AM   Modules accepted: Orders

## 2023-12-13 ENCOUNTER — Other Ambulatory Visit (HOSPITAL_COMMUNITY)

## 2023-12-13 ENCOUNTER — Ambulatory Visit (HOSPITAL_COMMUNITY)

## 2023-12-21 ENCOUNTER — Other Ambulatory Visit: Payer: Self-pay | Admitting: Medical

## 2024-01-13 ENCOUNTER — Inpatient Hospital Stay: Admitting: Medical Oncology

## 2024-01-13 ENCOUNTER — Inpatient Hospital Stay

## 2024-01-14 ENCOUNTER — Inpatient Hospital Stay: Admitting: Medical Oncology

## 2024-01-14 ENCOUNTER — Encounter: Payer: Self-pay | Admitting: Medical Oncology

## 2024-01-14 ENCOUNTER — Inpatient Hospital Stay: Attending: Medical Oncology

## 2024-01-14 VITALS — BP 115/62 | HR 83 | Temp 97.8°F | Resp 18 | Ht 69.0 in | Wt 147.8 lb

## 2024-01-14 DIAGNOSIS — D509 Iron deficiency anemia, unspecified: Secondary | ICD-10-CM | POA: Diagnosis not present

## 2024-01-14 DIAGNOSIS — R161 Splenomegaly, not elsewhere classified: Secondary | ICD-10-CM

## 2024-01-14 DIAGNOSIS — D649 Anemia, unspecified: Secondary | ICD-10-CM

## 2024-01-14 DIAGNOSIS — D709 Neutropenia, unspecified: Secondary | ICD-10-CM

## 2024-01-14 LAB — IRON AND IRON BINDING CAPACITY (CC-WL,HP ONLY)
Iron: 97 ug/dL (ref 28–170)
Saturation Ratios: 28 % (ref 10.4–31.8)
TIBC: 347 ug/dL (ref 250–450)
UIBC: 250 ug/dL

## 2024-01-14 LAB — CBC WITH DIFFERENTIAL (CANCER CENTER ONLY)
Abs Immature Granulocytes: 0.01 K/uL (ref 0.00–0.07)
Basophils Absolute: 0 K/uL (ref 0.0–0.1)
Basophils Relative: 1 %
Eosinophils Absolute: 0 K/uL (ref 0.0–0.5)
Eosinophils Relative: 1 %
HCT: 33.7 % — ABNORMAL LOW (ref 36.0–46.0)
Hemoglobin: 11.3 g/dL — ABNORMAL LOW (ref 12.0–15.0)
Immature Granulocytes: 0 %
Lymphocytes Relative: 51 %
Lymphs Abs: 1.8 K/uL (ref 0.7–4.0)
MCH: 26.2 pg (ref 26.0–34.0)
MCHC: 33.5 g/dL (ref 30.0–36.0)
MCV: 78 fL — ABNORMAL LOW (ref 80.0–100.0)
Monocytes Absolute: 0.4 K/uL (ref 0.1–1.0)
Monocytes Relative: 12 %
Neutro Abs: 1.3 K/uL — ABNORMAL LOW (ref 1.7–7.7)
Neutrophils Relative %: 35 %
Platelet Count: 219 K/uL (ref 150–400)
RBC: 4.32 MIL/uL (ref 3.87–5.11)
RDW: 19.7 % — ABNORMAL HIGH (ref 11.5–15.5)
WBC Count: 3.6 K/uL — ABNORMAL LOW (ref 4.0–10.5)
nRBC: 0 % (ref 0.0–0.2)

## 2024-01-14 LAB — RETIC PANEL
Immature Retic Fract: 12.4 % (ref 2.3–15.9)
RBC.: 4.33 MIL/uL (ref 3.87–5.11)
Retic Count, Absolute: 42 K/uL (ref 19.0–186.0)
Retic Ct Pct: 1 % (ref 0.4–3.1)
Reticulocyte Hemoglobin: 27.7 pg — ABNORMAL LOW (ref >27.9)

## 2024-01-14 LAB — FERRITIN: Ferritin: 41 ng/mL (ref 11–307)

## 2024-01-14 NOTE — Progress Notes (Signed)
 Hematology and Oncology Follow Up Visit  April Rodgers [id number] December 14, 1985 38 y.o. 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   History of chorioamnionitis    03-10-2015   History of fetal loss    03-10-2015  non-viable fetal demise twins at [redacted]w[redacted]d due to chorioamnionitis infection with PPROM   Hx of varicella    Hydrosalpinx    RIGHT   Migraine    Normal exercise tolerance test results    03-22-2006   Obstruction of fallopian tube    bilateral occlusion   Scoliosis     Principle Diagnosis:  Iron  Deficiency Anemia  Current Therapy:   IV Ferrlecit with premedications   PriorTherapy:   Oral iron - ineffective IV Venofer - d/c due to swelling of hands/feet    Interim History:  April Rodgers is back for follow-up for her anemia and abnormal CBC.     She was previously seen by Dr. Lonn. She was given IV Venofer  on 04/16/2022 and 04/23/2022. After both infusions she had swelling of hands and feet. Reaction responded well to benadryl . She has not had any supplemental iron  since. Former oral iron  caused significant constipation.    At the time of her referral back to us , her most recent labs showed a WBC of 3.5, Hgb of 10.8, MCV of 77.9, ANC of 1.3, Neutro relative 37.4%, lymph relative 47.5%, monocytes relative 13.6%, platelets of 229. Iron  71, TIBC 288, iron  saturation of 25%, ferritin 12.    She has had IDA since around 2009. She has also had an SPEP which was normal on 02/14/2022. She does report being suggested to have a BMB done when she was pregnant due to abnormal CBC results. She did not have this performed but requests this today.    She does have a history of Asherman's Syndrome. She was diagnosed with this around 2017. She does have heavy  menstrual cycles. She had endometrial surgery in 2017 which has helped lighten her menstrual cycles as bit. Menstrual cycles last about 3-5 days. Flow has reduced some since she had endometriosis surgery in 2018-2019  Today she states that she has been well. She has no concerns.   Of note, she does have a BMB scheduled on 02/18/2024 per patient request given previous abnormalities.   She also had a normal abdominal US  on 08/17/2023.   There has been no bleeding to her knowledge: denies epistaxis, gingivitis, hemoptysis, hematemesis, hematuria, melena, excessive bruising, blood donation. Menstrual cycles lasts about 4-5 days and is heavy the first 2 days then average in nature.    She denies unintentional weight loss or night sweats.  Wt Readings from Last 3 Encounters:  01/14/24 147 lb 12.8 oz (67 kg)  11/29/23 147 lb 12.8 oz (67 kg)  11/13/23 145 lb (65.8 kg)     Medications:   Current Outpatient Medications:    acetaminophen  (TYLENOL ) 500 MG tablet, Take 1,000 mg by mouth every 6 (six) hours as needed for moderate pain or fever., Disp: , Rfl:    aspirin EC 81 MG tablet, Take 81 mg by mouth daily. Swallow whole. (Patient taking differently: Take 81 mg by mouth daily. Swallow whole.), Disp: , Rfl:    fluticasone  (FLONASE )  50 MCG/ACT nasal spray, Place 2 sprays into both nostrils daily., Disp: 16 g, Rfl: 1   ibuprofen  (ADVIL ) 600 MG tablet, Take 1 tablet (600 mg total) by mouth every 8 (eight) hours as needed., Disp: 30 tablet, Rfl: 0   montelukast  (SINGULAIR ) 10 MG tablet, TAKE 1 TABLET BY MOUTH EVERYDAY AT BEDTIME, Disp: 90 tablet, Rfl: 1   olopatadine  (PATANOL) 0.1 % ophthalmic solution, Place 1 drop into both eyes 2 (two) times daily., Disp: 5 mL, Rfl: 4   RYALTRIS  665-25 MCG/ACT SUSP, 2 sprays each nostril twice a day as needed for runny or stuffy nose (Patient taking differently: 2 sprays each nostril twice a day as needed for runny or stuffy nose), Disp: 29 g, Rfl: 5   Vitamin D ,  Ergocalciferol , (DRISDOL ) 1.25 MG (50000 UNIT) CAPS capsule, Take 1 capsule (50,000 Units total) by mouth every 7 (seven) days., Disp: 8 capsule, Rfl: 0   sertraline (ZOLOFT) 50 MG tablet, Take 50 mg by mouth daily. (Patient not taking: Reported on 01/14/2024), Disp: , Rfl:   Allergies:  Allergies  Allergen Reactions   Iron  Sucrose Swelling and Other (See Comments)    Discomfort to bilateral feet, unilateral swelling of hand.   Penicillins Hives and Rash    Has patient had a PCN reaction causing immediate rash, facial/tongue/throat swelling, SOB or lightheadedness with hypotension: Yes Has patient had a PCN reaction causing severe rash involving mucus membranes or skin necrosis: No Has patient had a PCN reaction that required hospitalization No Has patient had a PCN reaction occurring within the last 10 years: No If all of the above answers are NO, then may proceed with Cephalosporin use.     Past Medical History, Surgical history, Social history, and Family History were reviewed and updated.  Review of Systems: Review of Systems  Constitutional: Negative.   HENT:  Negative.    Eyes: Negative.   Respiratory: Negative.    Cardiovascular: Negative.   Gastrointestinal: Negative.   Endocrine: Negative.   Genitourinary: Negative.    Musculoskeletal: Negative.   Skin: Negative.   Neurological: Negative.   Hematological: Negative.   Psychiatric/Behavioral: Negative.       Physical Exam:  height is 5' 9 (1.753 m) and weight is 147 lb 12.8 oz (67 kg). Her oral temperature is 97.8 F (36.6 C). Her blood pressure is 115/62 and her pulse is 83. Her respiration is 18 and oxygen saturation is 100%.   Physical Exam General: NAD Cardiovascular: regular rate and rhythm Pulmonary: clear ant fields Abdomen: soft, nontender, + bowel sounds GU: no suprapubic tenderness Extremities: no edema, no joint deformities Skin: no rashes Neurological: Weakness but otherwise nonfocal   Lab  Results  Component Value Date   WBC 3.6 (L) 01/14/2024   HGB 11.3 (L) 01/14/2024   HCT 33.7 (L) 01/14/2024   MCV 78.0 (L) 01/14/2024   PLT 219 01/14/2024     Chemistry      Component Value Date/Time   NA 137 11/29/2023 1023   NA 141 03/08/2022 1437   K 4.1 11/29/2023 1023   CL 106 11/29/2023 1023   CO2 24 11/29/2023 1023   BUN 7 11/29/2023 1023   BUN 5 (L) 03/08/2022 1437   CREATININE 0.68 11/29/2023 1023   CREATININE 0.68 11/13/2023 1110   CREATININE 0.72 02/14/2022 1116      Component Value Date/Time   CALCIUM  8.7 11/29/2023 1023   ALKPHOS 53 11/29/2023 1023   AST 10 11/29/2023 1023   AST 14 (L) 11/13/2023  1110   ALT 8 11/29/2023 1023   ALT 10 11/13/2023 1110   BILITOT 0.6 11/29/2023 1023   BILITOT 0.4 11/13/2023 1110     Encounter Diagnoses  Name Primary?   Iron  deficiency anemia, unspecified iron  deficiency anemia type Yes   Neutropenia, unspecified type    Assessment and Plan- Patient is a 38 y.o. female who was referred to us  for IDA and abnormal CBC findings. She has had a normal SPEP. Her IDA suspected to be secondary to heavy menses. She gets IV Ferrlecit PRN.   Today her Hgb is 11.3 which is stable MCV is 78 WBC is improved at 3.6 with ANC of 1.3. Platelets are normal Retic and iron  pending  We discussed her potentially seeing GI given her average menstrual cycles and recurrent IDA. She will consider this and discuss it further with her PCP  Disposition: BMB pending RTC 2 months APP, labs    Lauraine Dais PA-C 12/9/20251:26 PM

## 2024-01-20 ENCOUNTER — Ambulatory Visit: Payer: Self-pay | Admitting: Medical Oncology

## 2024-01-20 ENCOUNTER — Other Ambulatory Visit: Payer: Self-pay | Admitting: Medical Oncology

## 2024-02-18 ENCOUNTER — Ambulatory Visit (HOSPITAL_COMMUNITY)

## 2024-02-21 ENCOUNTER — Inpatient Hospital Stay: Attending: Medical Oncology

## 2024-02-28 ENCOUNTER — Inpatient Hospital Stay: Attending: Medical Oncology

## 2024-02-28 VITALS — BP 105/66 | HR 79 | Temp 98.3°F | Resp 18

## 2024-02-28 DIAGNOSIS — D509 Iron deficiency anemia, unspecified: Secondary | ICD-10-CM

## 2024-02-28 MED ORDER — METHYLPREDNISOLONE SODIUM SUCC 125 MG IJ SOLR
125.0000 mg | Freq: Once | INTRAMUSCULAR | Status: AC | PRN
  Administered 2024-02-28: 125 mg via INTRAVENOUS
  Filled 2024-02-28: qty 2

## 2024-02-28 MED ORDER — ACETAMINOPHEN 325 MG PO TABS
650.0000 mg | ORAL_TABLET | Freq: Once | ORAL | Status: AC
  Administered 2024-02-28: 650 mg via ORAL
  Filled 2024-02-28: qty 2

## 2024-02-28 MED ORDER — DIPHENHYDRAMINE HCL 50 MG/ML IJ SOLN
50.0000 mg | Freq: Once | INTRAMUSCULAR | Status: AC | PRN
  Administered 2024-02-28: 50 mg via INTRAVENOUS
  Filled 2024-02-28: qty 1

## 2024-02-28 MED ORDER — SODIUM CHLORIDE 0.9 % IV SOLN
125.0000 mg | Freq: Once | INTRAVENOUS | Status: AC
  Administered 2024-02-28: 125 mg via INTRAVENOUS
  Filled 2024-02-28: qty 125

## 2024-02-28 NOTE — Patient Instructions (Signed)
Sodium Ferric Gluconate Complex Injection What is this medication? SODIUM FERRIC GLUCONATE COMPLEX (SOE dee um FER ik GLOO koe nate KOM pleks) treats low levels of iron (iron deficiency anemia) in people with kidney disease. Iron is a mineral that plays an important role in making red blood cells, which carry oxygen from your lungs to the rest of your body. This medicine may be used for other purposes; ask your health care provider or pharmacist if you have questions. COMMON BRAND NAME(S): Ferrlecit, Nulecit What should I tell my care team before I take this medication? They need to know if you have any of the following conditions: Anemia that is not from iron deficiency High levels of iron in the blood An unusual or allergic reaction to iron, other medications, foods, dyes, or preservatives Pregnant or are trying to become pregnant Breast-feeding How should I use this medication? This medication is injected into a vein. It is given by your care team in a hospital or clinic setting. Talk to your care team about the use of this medication in children. While it may be prescribed for children as young as 6 years for selected conditions, precautions do apply. Overdosage: If you think you have taken too much of this medicine contact a poison control center or emergency room at once. NOTE: This medicine is only for you. Do not share this medicine with others. What if I miss a dose? It is important not to miss your dose. Call your care team if you are unable to keep an appointment. What may interact with this medication? Do not take this medication with any of the following: Deferasirox Deferoxamine Dimercaprol This medication may also interact with the following: Other iron products This list may not describe all possible interactions. Give your health care provider a list of all the medicines, herbs, non-prescription drugs, or dietary supplements you use. Also tell them if you smoke, drink  alcohol, or use illegal drugs. Some items may interact with your medicine. What should I watch for while using this medication? Your condition will be monitored carefully while you are receiving this medication. Visit your care team for regular checks on your progress. You may need blood work while you are taking this medication. What side effects may I notice from receiving this medication? Side effects that you should report to your care team as soon as possible: Allergic reactions--skin rash, itching, hives, swelling of the face, lips, tongue, or throat Low blood pressure--dizziness, feeling faint or lightheaded, blurry vision Shortness of breath Side effects that usually do not require medical attention (report to your care team if they continue or are bothersome): Flushing Headache Joint pain Muscle pain Nausea Pain, redness, or irritation at injection site This list may not describe all possible side effects. Call your doctor for medical advice about side effects. You may report side effects to FDA at 1-800-FDA-1088. Where should I keep my medication? This medication is given in a hospital or clinic and will not be stored at home. NOTE: This sheet is a summary. It may not cover all possible information. If you have questions about this medicine, talk to your doctor, pharmacist, or health care provider.  2024 Elsevier/Gold Standard (2020-06-17 00:00:00)

## 2024-03-06 ENCOUNTER — Inpatient Hospital Stay

## 2024-03-06 VITALS — BP 108/66 | HR 91 | Temp 98.9°F | Resp 18

## 2024-03-06 DIAGNOSIS — D509 Iron deficiency anemia, unspecified: Secondary | ICD-10-CM

## 2024-03-06 MED ORDER — ACETAMINOPHEN 325 MG PO TABS
650.0000 mg | ORAL_TABLET | Freq: Once | ORAL | Status: AC
  Administered 2024-03-06: 650 mg via ORAL
  Filled 2024-03-06: qty 2

## 2024-03-06 MED ORDER — DIPHENHYDRAMINE HCL 50 MG/ML IJ SOLN
50.0000 mg | Freq: Once | INTRAMUSCULAR | Status: AC
  Administered 2024-03-06: 50 mg via INTRAVENOUS
  Filled 2024-03-06: qty 1

## 2024-03-06 MED ORDER — SODIUM CHLORIDE 0.9 % IV SOLN: INTRAVENOUS | Status: DC

## 2024-03-06 MED ORDER — SODIUM CHLORIDE 0.9 % IV SOLN
125.0000 mg | Freq: Once | INTRAVENOUS | Status: AC
  Administered 2024-03-06: 125 mg via INTRAVENOUS
  Filled 2024-03-06: qty 10

## 2024-03-06 MED ORDER — METHYLPREDNISOLONE SODIUM SUCC 125 MG IJ SOLR
125.0000 mg | Freq: Once | INTRAMUSCULAR | Status: AC
  Administered 2024-03-06: 125 mg via INTRAVENOUS
  Filled 2024-03-06: qty 2

## 2024-03-06 NOTE — Patient Instructions (Signed)
 Sodium Ferric Gluconate Complex Injection What is this medication? SODIUM FERRIC GLUCONATE COMPLEX (SOE dee um FER ik GLOO koe nate KOM pleks) treats low levels of iron  (iron  deficiency anemia) in people with kidney disease. Iron  is a mineral that plays an important role in making red blood cells, which carry oxygen from your lungs to the rest of your body. This medicine may be used for other purposes; ask your health care provider or pharmacist if you have questions. COMMON BRAND NAME(S): Ferrlecit, Nulecit What should I tell my care team before I take this medication? They need to know if you have any of the following conditions: Anemia that is not from iron  deficiency High levels of iron  in the blood An unusual or allergic reaction to iron , other medications, foods, dyes, or preservatives Pregnant or are trying to become pregnant Breast-feeding How should I use this medication? This medication is injected into a vein. It is given by your care team in a hospital or clinic setting. Talk to your care team about the use of this medication in children. While it may be prescribed for children as young as 6 years for selected conditions, precautions do apply. Overdosage: If you think you have taken too much of this medicine contact a poison control center or emergency room at once. NOTE: This medicine is only for you. Do not share this medicine with others. What if I miss a dose? It is important not to miss your dose. Call your care team if you are unable to keep an appointment. What may interact with this medication? Do not take this medication with any of the following: Deferasirox Deferoxamine Dimercaprol This medication may also interact with the following: Other iron  products This list may not describe all possible interactions. Give your health care provider a list of all the medicines, herbs, non-prescription drugs, or dietary supplements you use. Also tell them if you smoke, drink  alcohol, or use illegal drugs. Some items may interact with your medicine. What should I watch for while using this medication? Your condition will be monitored carefully while you are receiving this medication. Tell your care team if your symptoms do not start to get better or if they get worse. You may need blood work done while you are taking this medication. Sometimes, when medications are infused into veins, a little can leak out of the vein and into the tissue around it. If this medication leaks, it can cause a brown or dark stain on the skin. This is not common. It may be permanent. If you feel pain or swelling during your infusion, tell your care team right away. They can stop the infusion and treat the area. You may need to eat more foods that contain iron . Talk to your care team. Foods that contain iron  include whole grains or cereals, dried fruits, beans, peas, leafy green vegetables, and organ meats (liver, kidney). What side effects may I notice from receiving this medication? Side effects that you should report to your care team as soon as possible: Allergic reactions--skin rash, itching, hives, swelling of the face, lips, tongue, or throat Low blood pressure--dizziness, feeling faint or lightheaded, blurry vision Painful swelling, warmth, or redness of the skin, brown or dark skin color at the infusion site Shortness of breath Side effects that usually do not require medical attention (report these to your care team if they continue or are bothersome): Flushing Headache Joint pain Muscle pain Nausea This list may not describe all possible side effects. Call  your doctor for medical advice about side effects. You may report side effects to FDA at 1-800-FDA-1088. Where should I keep my medication? This medication is given in a hospital or clinic and will not be stored at home. NOTE: This sheet is a summary. It may not cover all possible information. If you have questions about this  medicine, talk to your doctor, pharmacist, or health care provider.  2025 [employer] Standard (2023-12-11 00:00:00)

## 2024-03-13 ENCOUNTER — Inpatient Hospital Stay

## 2024-03-13 VITALS — BP 114/67 | HR 96 | Temp 98.2°F | Resp 18

## 2024-03-13 DIAGNOSIS — D509 Iron deficiency anemia, unspecified: Secondary | ICD-10-CM

## 2024-03-13 MED ORDER — METHYLPREDNISOLONE SODIUM SUCC 125 MG IJ SOLR
125.0000 mg | Freq: Once | INTRAMUSCULAR | Status: AC
  Administered 2024-03-13: 125 mg via INTRAVENOUS
  Filled 2024-03-13: qty 2

## 2024-03-13 MED ORDER — ACETAMINOPHEN 325 MG PO TABS
650.0000 mg | ORAL_TABLET | Freq: Once | ORAL | Status: AC
  Administered 2024-03-13: 650 mg via ORAL
  Filled 2024-03-13: qty 2

## 2024-03-13 MED ORDER — SODIUM CHLORIDE 0.9 % IV SOLN
125.0000 mg | Freq: Once | INTRAVENOUS | Status: AC
  Administered 2024-03-13: 125 mg via INTRAVENOUS
  Filled 2024-03-13: qty 10

## 2024-03-13 MED ORDER — DIPHENHYDRAMINE HCL 50 MG/ML IJ SOLN
50.0000 mg | Freq: Once | INTRAMUSCULAR | Status: AC
  Administered 2024-03-13: 50 mg via INTRAVENOUS
  Filled 2024-03-13: qty 1

## 2024-03-13 NOTE — Patient Instructions (Signed)
 Sodium Ferric Gluconate Complex Injection What is this medication? SODIUM FERRIC GLUCONATE COMPLEX (SOE dee um FER ik GLOO koe nate KOM pleks) treats low levels of iron  (iron  deficiency anemia) in people with kidney disease. Iron  is a mineral that plays an important role in making red blood cells, which carry oxygen from your lungs to the rest of your body. This medicine may be used for other purposes; ask your health care provider or pharmacist if you have questions. COMMON BRAND NAME(S): Ferrlecit, Nulecit What should I tell my care team before I take this medication? They need to know if you have any of the following conditions: Anemia that is not from iron  deficiency High levels of iron  in the blood An unusual or allergic reaction to iron , other medications, foods, dyes, or preservatives Pregnant or are trying to become pregnant Breast-feeding How should I use this medication? This medication is injected into a vein. It is given by your care team in a hospital or clinic setting. Talk to your care team about the use of this medication in children. While it may be prescribed for children as young as 6 years for selected conditions, precautions do apply. Overdosage: If you think you have taken too much of this medicine contact a poison control center or emergency room at once. NOTE: This medicine is only for you. Do not share this medicine with others. What if I miss a dose? It is important not to miss your dose. Call your care team if you are unable to keep an appointment. What may interact with this medication? Do not take this medication with any of the following: Deferasirox Deferoxamine Dimercaprol This medication may also interact with the following: Other iron  products This list may not describe all possible interactions. Give your health care provider a list of all the medicines, herbs, non-prescription drugs, or dietary supplements you use. Also tell them if you smoke, drink  alcohol, or use illegal drugs. Some items may interact with your medicine. What should I watch for while using this medication? Your condition will be monitored carefully while you are receiving this medication. Tell your care team if your symptoms do not start to get better or if they get worse. You may need blood work done while you are taking this medication. Sometimes, when medications are infused into veins, a little can leak out of the vein and into the tissue around it. If this medication leaks, it can cause a brown or dark stain on the skin. This is not common. It may be permanent. If you feel pain or swelling during your infusion, tell your care team right away. They can stop the infusion and treat the area. You may need to eat more foods that contain iron . Talk to your care team. Foods that contain iron  include whole grains or cereals, dried fruits, beans, peas, leafy green vegetables, and organ meats (liver, kidney). What side effects may I notice from receiving this medication? Side effects that you should report to your care team as soon as possible: Allergic reactions--skin rash, itching, hives, swelling of the face, lips, tongue, or throat Low blood pressure--dizziness, feeling faint or lightheaded, blurry vision Painful swelling, warmth, or redness of the skin, brown or dark skin color at the infusion site Shortness of breath Side effects that usually do not require medical attention (report these to your care team if they continue or are bothersome): Flushing Headache Joint pain Muscle pain Nausea This list may not describe all possible side effects. Call  your doctor for medical advice about side effects. You may report side effects to FDA at 1-800-FDA-1088. Where should I keep my medication? This medication is given in a hospital or clinic and will not be stored at home. NOTE: This sheet is a summary. It may not cover all possible information. If you have questions about this  medicine, talk to your doctor, pharmacist, or health care provider.  2025 [employer] Standard (2023-12-11 00:00:00)

## 2024-03-17 ENCOUNTER — Inpatient Hospital Stay

## 2024-03-17 ENCOUNTER — Inpatient Hospital Stay: Admitting: Medical Oncology

## 2024-03-20 ENCOUNTER — Inpatient Hospital Stay

## 2024-04-14 ENCOUNTER — Inpatient Hospital Stay

## 2024-04-14 ENCOUNTER — Inpatient Hospital Stay: Admitting: Medical Oncology
